# Patient Record
Sex: Male | Born: 1970 | Race: White | Hispanic: No | Smoking: Never smoker
Health system: Southern US, Community
[De-identification: ages and names within clinical notes are randomized; demographics above are authoritative.]

## PROBLEM LIST (undated history)

## (undated) DIAGNOSIS — I509 Heart failure, unspecified: Secondary | ICD-10-CM

## (undated) DIAGNOSIS — E1140 Type 2 diabetes mellitus with diabetic neuropathy, unspecified: Secondary | ICD-10-CM

## (undated) DIAGNOSIS — I10 Essential (primary) hypertension: Secondary | ICD-10-CM

## (undated) DIAGNOSIS — K5730 Diverticulosis of large intestine without perforation or abscess without bleeding: Secondary | ICD-10-CM

## (undated) DIAGNOSIS — K5792 Diverticulitis of intestine, part unspecified, without perforation or abscess without bleeding: Secondary | ICD-10-CM

## (undated) DIAGNOSIS — M5430 Sciatica, unspecified side: Secondary | ICD-10-CM

## (undated) DIAGNOSIS — N189 Chronic kidney disease, unspecified: Secondary | ICD-10-CM

## (undated) DIAGNOSIS — I219 Acute myocardial infarction, unspecified: Secondary | ICD-10-CM

## (undated) DIAGNOSIS — F1910 Other psychoactive substance abuse, uncomplicated: Secondary | ICD-10-CM

## (undated) DIAGNOSIS — M1990 Unspecified osteoarthritis, unspecified site: Secondary | ICD-10-CM

## (undated) DIAGNOSIS — T7840XA Allergy, unspecified, initial encounter: Secondary | ICD-10-CM

## (undated) DIAGNOSIS — E119 Type 2 diabetes mellitus without complications: Secondary | ICD-10-CM

## (undated) HISTORY — DX: Essential (primary) hypertension: I10

## (undated) HISTORY — PX: HERNIA REPAIR: SHX51

## (undated) HISTORY — DX: Allergy, unspecified, initial encounter: T78.40XA

## (undated) HISTORY — DX: Heart failure, unspecified: I50.9

## (undated) HISTORY — DX: Acute myocardial infarction, unspecified: I21.9

## (undated) HISTORY — DX: Unspecified osteoarthritis, unspecified site: M19.90

## (undated) HISTORY — PX: CARDIAC SURGERY: SHX584

## (undated) HISTORY — DX: Other psychoactive substance abuse, uncomplicated: F19.10

## (undated) HISTORY — DX: Chronic kidney disease, unspecified: N18.9

## (undated) HISTORY — PX: COLON SURGERY: SHX602

---

## 2013-05-12 DIAGNOSIS — E1169 Type 2 diabetes mellitus with other specified complication: Secondary | ICD-10-CM | POA: Insufficient documentation

## 2013-05-12 DIAGNOSIS — K5790 Diverticulosis of intestine, part unspecified, without perforation or abscess without bleeding: Secondary | ICD-10-CM | POA: Insufficient documentation

## 2013-05-12 DIAGNOSIS — E7800 Pure hypercholesterolemia, unspecified: Secondary | ICD-10-CM | POA: Insufficient documentation

## 2013-05-12 DIAGNOSIS — I10 Essential (primary) hypertension: Secondary | ICD-10-CM | POA: Insufficient documentation

## 2013-05-12 DIAGNOSIS — E782 Mixed hyperlipidemia: Secondary | ICD-10-CM | POA: Insufficient documentation

## 2013-05-12 DIAGNOSIS — E119 Type 2 diabetes mellitus without complications: Secondary | ICD-10-CM | POA: Insufficient documentation

## 2013-08-21 ENCOUNTER — Ambulatory Visit: Payer: Self-pay

## 2013-08-21 ENCOUNTER — Emergency Department: Payer: Self-pay | Admitting: Emergency Medicine

## 2013-08-21 LAB — URINALYSIS, COMPLETE
Bacteria: NONE SEEN
Glucose,UR: >=500 mg/dL (ref 0–75)
Leukocyte Esterase: NEGATIVE
Nitrite: NEGATIVE
Protein: 100
RBC,UR: 1 /HPF (ref 0–5)
WBC UR: <1 /HPF (ref 0–5)

## 2013-08-21 LAB — BASIC METABOLIC PANEL
Anion Gap: 16 (ref 7–16)
Calcium, Total: 9 mg/dL (ref 8.5–10.1)
Chloride: 102 mmol/L (ref 98–107)
Co2: 13 mmol/L — ABNORMAL LOW (ref 21–32)
Creatinine: 0.35 mg/dL — ABNORMAL LOW (ref 0.60–1.30)
EGFR (African American): >60
EGFR (Non-African Amer.): >60
Potassium: 5.5 mmol/L — ABNORMAL HIGH (ref 3.5–5.1)
Sodium: 131 mmol/L — ABNORMAL LOW (ref 136–145)

## 2013-08-26 LAB — CBC
HCT: 43.5 % (ref 40.0–52.0)
HGB: 13.8 g/dL (ref 13.0–18.0)
MCH: 28.2 pg (ref 26.0–34.0)
Platelet: 237 10*3/uL (ref 150–440)
RBC: 4.88 10*6/uL (ref 4.40–5.90)
WBC: 5.2 10*3/uL (ref 3.8–10.6)

## 2013-08-30 ENCOUNTER — Observation Stay: Payer: Self-pay | Admitting: Internal Medicine

## 2013-08-30 LAB — BASIC METABOLIC PANEL
Anion Gap: 16 (ref 7–16)
BUN: 13 mg/dL (ref 7–18)
Calcium, Total: 9.1 mg/dL (ref 8.5–10.1)
Chloride: 89 mmol/L — ABNORMAL LOW (ref 98–107)
Creatinine: 0.53 mg/dL — ABNORMAL LOW (ref 0.60–1.30)
EGFR (African American): >60
EGFR (Non-African Amer.): >60
Potassium: 4.5 mmol/L (ref 3.5–5.1)
Sodium: 122 mmol/L — ABNORMAL LOW (ref 136–145)

## 2013-08-30 LAB — HEPATIC FUNCTION PANEL A (ARMC)
Alkaline Phosphatase: 94 U/L
Bilirubin, Direct: <0.1 mg/dL (ref 0.00–0.20)
Bilirubin,Total: 0.8 mg/dL (ref 0.2–1.0)
SGOT(AST): 29 U/L (ref 15–37)
SGPT (ALT): 30 U/L (ref 12–78)
Total Protein: 7.7 g/dL (ref 6.4–8.2)

## 2013-08-30 LAB — LIPASE, BLOOD: Lipase: 275 U/L (ref 73–393)

## 2013-08-30 LAB — CBC
HCT: 43.3 % (ref 40.0–52.0)
HGB: 15.9 g/dL (ref 13.0–18.0)
MCH: 33.2 pg (ref 26.0–34.0)
MCHC: 36.6 g/dL — ABNORMAL HIGH (ref 32.0–36.0)
MCV: 91 fL (ref 80–100)
RDW: 13.8 % (ref 11.5–14.5)
WBC: 5.9 10*3/uL (ref 3.8–10.6)

## 2013-08-30 LAB — CK TOTAL AND CKMB (NOT AT ARMC): CK, Total: 140 U/L (ref 35–232)

## 2013-08-30 LAB — TROPONIN I: Troponin-I: <0.02 ng/mL

## 2014-11-15 LAB — BASIC METABOLIC PANEL
BUN: 14 mg/dL (ref 4–21)
Creatinine: 0.9 mg/dL (ref 0.6–1.3)
Glucose: 386 mg/dL
Sodium: 136 mmol/L — AB (ref 137–147)

## 2014-11-15 LAB — HEPATIC FUNCTION PANEL: Bilirubin, Total: 0.4 mg/dL

## 2014-12-11 ENCOUNTER — Ambulatory Visit
Admit: 2014-12-11 | Disposition: A | Discharge status: Home / Self Care | Payer: Self-pay | Attending: Internal Medicine | Admitting: Internal Medicine

## 2015-01-04 ENCOUNTER — Ambulatory Visit
Admit: 2015-01-04 | Disposition: A | Discharge status: Home / Self Care | Payer: Self-pay | Attending: Internal Medicine | Admitting: Internal Medicine

## 2015-01-25 NOTE — Discharge Summary (Signed)
PATIENT NAME:  Franklin Douglas, Andrews C MR#:  [id number] DATE OF BIRTH:  Aug 20, 1971  DATE OF ADMISSION:  08/30/2013  DATE OF DISCHARGE:  08/30/2013  PRIMARY CARE PHYSICIAN:  Dr. [place]ariah in [place]   CHIEF COMPLAINT: Chest pain and epigastric pain.   DISCHARGE DIAGNOSES: 1.  Chest pain, appears atypical, improved.  2.  Epigastric pain suspected due to acid reflux/GERD, improved with GI cocktail and Protonix.  3.  Type 2 diabetes.  4.  Hyperlipidemia.  5.  Hypertension.   CONDITION ON DISCHARGE: Fair.   CONSULTATIONS:  Cardiology, Dr. Darrold JunkerParaschos recommends outpatient stress test.   LABS AT DISCHARGE:  Cardiac enzymes x 2 negative. 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 No evidence of bowel obstruction. Normal appendix. Hepatic steatosis. CT angiography of the chest shows no evidence of PE, intramural hematoma, aortic dissection, aortic transection, or aortic aneurysm. No acute bony deformity. There is wall thickening of the distal half of the esophagus. Trace proximal calcifications. LFTs within normal limits. Chest x-ray within normal limits. CBC within normal limits. Glucose 402, BUN 13, creatinine 0.53, sodium 122, potassium 12.5, chloride is 89, bicarb is 17. Lipase is 275.   BRIEF SUMMARY OF HOSPITAL COURSE:  Franklin Douglas is a 44 year old Caucasian gentleman with history of type 2 diabetes, hypertension and hyperlipidemia, comes to the Emergency Room with complaints of left-sided chest discomfort along with epigastric pain. He was admitted with:   1.  Chest pain. The patient was ruled out for acute MI with negative EKG and 2 sets of negative cardiac enzymes. He was seen by Dr. Darrold JunkerParaschos, who recommends patient take metoprolol for his tachycardia, continue his losartan statins. The patient's symptoms improved. He did not have any further chest pain, and his EKG did not show acute ST-T changes. Per Dr. Darrold JunkerParaschos,   patient could get outpatient stress test.   2.  Uncontrolled type 2 diabetes. The patient has not taken his insulin for the last few days. He is recommended to go back on his insulin regimen. As before, check sugars and watch his diet.   3.  Hyponatremia. Appears to be due to mild dehydration and pseudohyponatremia in the setting of elevated sugars. The patient received a couple of liters of IV fluids, and he appears euvolemic.  4.  Hypertension. The patient was continued on his losartan, beta blockers. Metoprolol was added by Dr. Darrold JunkerParaschos.   5.  Acid reflux, GERD, and epigastric discomfort. The patient received GI cocktail in the Emergency Room. He felt improved with that. He is prescribed Prilosec 40 mg p.o. daily.   6.  Alcohol abuse. The patient was kept on CIWA protocol. He appears to be stable at this time. He is advised on alcohol, abstinence from alcohol.    Hospital stay otherwise remained stable. The patient will follow up with his primary care physician in 1 to 2 weeks, and follow up with Dr. Darrold JunkerParaschos as outpatient for a stress test. The patient requested he wanted to go home, since he was feeling better. He had a good meal while he was here in the hospital, and felt a lot better.   TIME SPENT: 40 minutes.    ____________________________ Wylie HailSona A. Allena KatzPatel, MD sap:mr D: 08/30/2013 17:05:00 ET T: 08/30/2013 20:24:20 ET JOB#: [id number]  cc: Blenda NicelyAnita Mary Skariah, DO Asheton Scheffler A. Allena KatzPatel, MD, <Dictator>   Willow OraSONA A Jrue Yambao MD ELECTRONICALLY SIGNED 09/01/2013 10:42

## 2015-01-25 NOTE — H&P (Signed)
PATIENT NAME:  Franklin Douglas, Franklin Douglas MR#:  [id number] DATE OF BIRTH:  Sep 22, 1971  DATE OF ADMISSION:  08/30/2013  PRIMARY CARE PHYSICIAN:  Dr. Lewis MoccasinSkariah, [facility].  CHIEF COMPLAINT: Chest pain and epigastric pain on and off for 1 week.   Franklin Douglas is a 44 year old Caucasian gentleman with history of hypertension, diabetes and history of some colon resection in the remote past, along with hypercholesterolemia, comes to the Emergency Room after he started having chest pain, left-sided, with some numbness in his left arm, along with epigastric discomfort with symptoms of vomiting x 2 yesterday. The patient states he could not get comfortable, not able to sleep with his chest discomfort. Came to the Emergency Room and was found to be very tachycardic with heart rate in the 120s. He received some IV fluids. Heart rate came down to 112. Blood pressure is stable; however, he still does not feel comfortable. He received GI cocktail in the Emergency Room which helped him. He also received a dose of Protonix in the Emergency Room. The patient's sodium is 122. He has uncontrolled diabetes since he has not taken his insulin in the last few days. The patient reports he binge drank this weekend, a lot of whiskey and liquor because of this ongoing chest pain, thought it would help him; however, his symptoms got worse, came to the Emergency Room, for which he is going to be admitted for further evaluation and management.   PAST MEDICAL HISTORY: 1.  Type 2 diabetes, on insulin.  2.  Hyperlipidemia.  3.  Hypertension.  4.  History of colon resection in the past.    ALLERGIES: SULFA DRUGS.   MEDICATIONS: 1.  Fenofibrate 160 mg p.o. daily at bedtime.  2.  Aspirin 325 p.o. daily.  3.  Humulin N 70/30, 60 units daily.  4.  Losartan 25 mg at bedtime.   FAMILY HISTORY: Positive for mother with diabetes and grandmother, who passed away, had history of CAD.   SOCIAL HISTORY: He is unemployed since  August. Denies any history of smoking. Drinks alcohol 1 to 2 whiskeys couple of days a week. However, last weekend, he drank a lot of  whiskey and vodka. The patient denies any DTs or any alcohol related problems in the past.     REVIEW OF SYSTEMS:   CONSTITUTIONAL: Positive for fatigue, weakness.  EYES: No blurred or double vision, redness or glaucoma.  ENT: No tinnitus, ear pain, hearing loss or epistaxis.  RESPIRATORY: No cough, wheeze, hemoptysis or COPD.  CARDIOVASCULAR: Positive for chest pain, tachycardia and hypertension.  GASTROINTESTINAL: No nausea.  Positive for vomiting, epigastric discomfort. Negative for GERD, rectal bleeding. No constipation.  GENITOURINARY: No dysuria, hematuria or frequency.  ENDOCRINE: No polyuria, nocturia or thyroid problems.  HEMATOLOGY: No anemia or easy bruising.  SKIN: No acne or rash.  MUSCULOSKELETAL: Negative for arthritis, cramps, swelling or gout.  NEUROLOGIC: No CVA, TIA, seizures or dysarthria.  PSYCHIATRIC: No anxiety or depression or bipolar disorder. All other systems reviewed are negative.   PHYSICAL EXAMINATION: GENERAL: The patient is awake, alert, oriented x 3. Mild to moderate distress due to chest and abdominal discomfort.  VITAL SIGNS: Temperature 97.9. Pulse is 112, respirations 18. Blood pressure is 122/74. Sats are 97% on room air.  HEENT: Atraumatic, normocephalic. Pupils: PERRLA. EOM intact. Oral mucosa is dry.  NECK: Supple. No JVD. No carotid bruit.  LUNGS: Clear to auscultation bilaterally. No rales, rhonchi, respiratory distress or labored breathing.  CARDIOVASCULAR: Tachycardia present. No murmur  heard. No pain is elicited on palpation of the chest. No murmur heard.  ABDOMEN: Soft. No tenderness in the epigastric or right upper quadrant. There is some tenderness present in the left upper quadrant. No mass or any bruit noted. Bowel sounds are positive.  NEUROLOGIC: Grossly intact cranial nerves II through XII. No motor or  sensory deficits. Reflexes 1+ at both upper and lower extremities.  PSYCHIATRIC: The patient is awake, alert, oriented x 3. Mood and affect are normal.  SKIN: Warm and dry.   PH is 7.26. PCO2 is 28.   Lipid profile within normal limits.   Troponin is 0.02.   CBC within normal limits.   Basic metabolic panel shows a glucose of 402, BUN 13, creatinine 0.53. Sodium is 122. Potassium is 4.5. Chloride is 89. Bicarb is 17. Anion gap is 16. Lipase is 275.   EKG shows sinus tachycardia with LAD and nonspecific ST-T changes.   ASSESSMENT AND PLAN: A 44 year old Carmichael Burdette with history of hypertension, type 2 diabetes, hyperlipidemia, comes in with on-and-off chest pain, along with alcohol binge drinking over the weekend and epigastric discomfort. He is going to be admitted with:  1.  Chest pain and tachycardia. The patient has chest pressure, which has been on and off for the last 1 week. We will admit the patient to telemetry floor, cycle cardiac enzymes x 3. EKG does not show any ST elevation or depression. I will give the patient nitroglycerin p.r.n.  Have cardiology see the patient. The patient may benefit from Myoview stress test. I will hold off on aspirin given his epigastric discomfort. This could be due to acid reflux as well.  2.  Epigastric discomfort with history of alcohol drinking, significant amount over the weekend. The patient could have developed acute gastritis. We will start the patient on Protonix IV b.i.d. Consider GI consultation if symptoms are not improved. The patient's CT of the abdomen is pending.  3.  Type 2 diabetes. Resume insulin 60 units at lunchtime and sliding scale insulin.  4.  Hypertension, on losartan.  5.  Hyponatremia with dehydration secondary to nausea and vomiting at home. We will give IV fluids. Some of the low sodium level could be because of uncontrolled diabetes contributing to pseudohyponatremia. Given his symptoms, we will give IV fluids, follow up  metabolic panel.  6.  Deep venous thrombosis prophylaxis. The patient will be on subQ heparin.  7.  Gastrointestinal prophylaxis. The patient is on IV Protonix b.i.d.  8.  Hyperlipidemia, on fenofibrate.   The above was discussed with the patient, who is agreeable to it. Further workup will depend on the patient's clinical course.   TIME SPENT: 55 minutes.     ____________________________ Wylie Hail Allena Katz, MD sap:dmm D: 08/30/2013 11:39:15 ET T: 08/30/2013 12:18:51 ET JOB#: [id number]  cc: Tamaj Jurgens A. Allena Katz, MD, <Dictator> Blenda Nicely, DO Willow Ora MD ELECTRONICALLY SIGNED 09/01/2013 10:42

## 2015-01-25 NOTE — Consult Note (Signed)
PATIENT NAME:  Franklin Douglas, Franklin Douglas#:  [id number] DATE OF BIRTH:  [birth year]  DATE OF CONSULTATION:  08/30/2013  CONSULTING PHYSICIAN:  Marcina MillardAlexander Skylene Deremer, MD  PRIMARY CARE PHYSICIAN: Dr. Lewis MoccasinSkariah, [facility].  CHIEF COMPLAINT: Chest and epigastric discomfort.   REASON FOR CONSULTATION: Consultation requested for evaluation of chest pain.   HISTORY OF PRESENT ILLNESS: The patient is a 44 year old gentleman with history of hypertension, hyperlipidemia, and diabetes. The patient has also had a history of colon resection. The patient apparently was [facility] Room a week ago with chest discomfort, epigastric discomfort, nausea. The patient has had recurrent symptoms earlier today and presented to [facility]. The patient was noted to be tachycardic with sinus tachycardia in the 110 to 120 range. The patient was treated with GI cocktail, which provided some relief, and the patient was treated with Protonix. The patient was noted to be hyponatremic with a sodium of 122. The patient apparently has not taken his insulin in several days. He was admitted to telemetry where initial troponin is negative. The patient's symptoms have improved.   PAST MEDICAL HISTORY: 1.  Hypertension.  2.  Hyperlipidemia.  3.  Diabetes.  4.  History of colon resection.  MEDICATIONS: Aspirin 325 mg daily, losartan 25 mg daily, Humulin N 70/30 with 6 units daily, fenofibrate 160 mg at bedtime.   SOCIAL HISTORY: The patient is unemployed. He lives alone. He drinks occasional alcohol. He apparently had binge drinking last weekend. He denies tobacco abuse.   FAMILY HISTORY: No immediate family history for coronary artery disease or myocardial infarction.   REVIEW OF SYSTEMS:    CONSTITUTIONAL: No fever or chills.  EYES: No blurry vision.  EARS: No hearing loss.  RESPIRATORY: No shortness of breath.  CARDIOVASCULAR: Chest discomfort as described above.  GASTROINTESTINAL: Midepigastric  discomfort with nausea and vomiting.  GENITOURINARY: No dysuria or hematuria.  ENDOCRINE: The patient has type 2 diabetes.  HEMATOLOGICAL: No easy bruising or bleeding.  INTEGUMENTARY: No rash.  MUSCULOSKELETAL: No arthralgias or myalgias.  NEUROLOGICAL: No focal muscle weakness or numbness.  PSYCHOLOGICAL: No depression or anxiety.   PHYSICAL EXAMINATION: VITAL SIGNS: Blood pressure 136/87, pulse 108, respirations 18, temperature 97.5, pulse oximetry 97%.  HEENT: Pupils equal, reactive to light and accommodation.  NECK: Supple without thyromegaly.  LUNGS: Clear.  HEART: Normal JVP. Normal PMI. Regular rate and rhythm. Normal S1, S2. No appreciable gallop, murmur, or rub.  ABDOMEN: Soft and nontender without hepatosplenomegaly.  EXTREMITIES: No cyanosis, clubbing, or edema. Pulses were intact bilaterally.  MUSCULOSKELETAL: Normal muscle tone.  NEUROLOGIC: The patient is alert and oriented x 3. Motor and sensory both grossly intact.   IMPRESSION: A 44 year old gentleman with multiple cardiovascular risk factors with chest pain with atypical features with negative troponin. The patient's symptoms appear to improved.   RECOMMENDATIONS: 1.  Agree with overall current therapy.  2.  Would defer full-dose anticoagulation.  3.  If second troponin is negative, may consider outpatient stress test. 4.  Stressed with the patient about the importance of being compliant with his medications, especially insulin.  ____________________________ Marcina MillardAlexander Estle Sabella, MD ap:jcm D: 08/30/2013 14:17:21 ET T: 08/30/2013 15:02:42 ET JOB#: [id number]  cc: Marcina MillardAlexander Danyell Awbrey, MD, <Dictator> Marcina MillardALEXANDER Alaycia Eardley MD ELECTRONICALLY SIGNED 09/22/2013 8:50

## 2015-02-20 ENCOUNTER — Other Ambulatory Visit: Payer: Self-pay

## 2015-02-20 LAB — CBC AND DIFFERENTIAL
NEUTROS ABS: 2 /uL
WBC: 3.8 10*3/mL

## 2015-02-20 LAB — TSH: TSH: 2.68 u[IU]/mL (ref 0.41–5.90)

## 2015-02-20 LAB — LIPID PANEL
Cholesterol: 256 mg/dL — AB (ref 0–200)
HDL: 41 mg/dL (ref 35–70)
Triglycerides: 463 mg/dL — AB (ref 40–160)

## 2015-02-20 LAB — MICROALBUMIN, URINE: Microalb, Ur: 510.4

## 2015-02-20 LAB — HEMOGLOBIN A1C: Hemoglobin A1C: 6.8

## 2015-02-27 ENCOUNTER — Encounter: Payer: Self-pay | Admitting: Emergency Medicine

## 2015-02-27 ENCOUNTER — Ambulatory Visit
Admission: EM | Admit: 2015-02-27 | Discharge: 2015-02-27 | Disposition: A | Discharge status: Home / Self Care | Payer: Self-pay | Attending: Family Medicine | Admitting: Family Medicine

## 2015-02-27 ENCOUNTER — Ambulatory Visit: Payer: Self-pay

## 2015-02-27 ENCOUNTER — Ambulatory Visit: Payer: Self-pay | Admitting: Internal Medicine

## 2015-02-27 DIAGNOSIS — R05 Cough: Secondary | ICD-10-CM | POA: Insufficient documentation

## 2015-02-27 DIAGNOSIS — Z882 Allergy status to sulfonamides status: Secondary | ICD-10-CM | POA: Insufficient documentation

## 2015-02-27 DIAGNOSIS — R059 Cough, unspecified: Secondary | ICD-10-CM

## 2015-02-27 DIAGNOSIS — B349 Viral infection, unspecified: Secondary | ICD-10-CM | POA: Insufficient documentation

## 2015-02-27 DIAGNOSIS — E1140 Type 2 diabetes mellitus with diabetic neuropathy, unspecified: Secondary | ICD-10-CM | POA: Insufficient documentation

## 2015-02-27 DIAGNOSIS — E119 Type 2 diabetes mellitus without complications: Secondary | ICD-10-CM | POA: Insufficient documentation

## 2015-02-27 DIAGNOSIS — Z794 Long term (current) use of insulin: Secondary | ICD-10-CM | POA: Insufficient documentation

## 2015-02-27 HISTORY — DX: Sciatica, unspecified side: M54.30

## 2015-02-27 HISTORY — DX: Type 2 diabetes mellitus without complications: E11.9

## 2015-02-27 HISTORY — DX: Diverticulitis of intestine, part unspecified, without perforation or abscess without bleeding: K57.92

## 2015-02-27 HISTORY — DX: Type 2 diabetes mellitus with diabetic neuropathy, unspecified: E11.40

## 2015-02-27 MED ORDER — BENZONATATE 200 MG PO CAPS: 200.0000 mg | ORAL_CAPSULE | Freq: Three times a day (TID) | ORAL | Status: DC | PRN

## 2015-02-27 MED ORDER — ALBUTEROL SULFATE HFA 108 (90 BASE) MCG/ACT IN AERS: 1.0000 | INHALATION_SPRAY | Freq: Four times a day (QID) | RESPIRATORY_TRACT | Status: DC | PRN

## 2015-02-27 MED ORDER — IPRATROPIUM-ALBUTEROL 0.5-2.5 (3) MG/3ML IN SOLN
3.0000 mL | Freq: Once | RESPIRATORY_TRACT | Status: AC
  Administered 2015-02-27: 3 mL via RESPIRATORY_TRACT

## 2015-02-27 NOTE — ED Provider Notes (Signed)
CSN: [id number]     Arrival date & time 02/27/15  1435 History   First MD Initiated Contact with Patient 02/27/15 1505     Chief Complaint  Patient presents with  . Cough   (Consider location/radiation/quality/duration/timing/severity/associated sxs/prior Treatment) Patient is a 44 y.o. male presenting with cough. The history is provided by the patient.  Cough Cough characteristics:  Productive Sputum characteristics:  Bloody Severity:  Mild Onset quality:  Sudden Duration:  2 hours Chronicity:  New Smoker: no   Relieved by:  Nothing Worsened by:  Nothing tried Associated symptoms: chills, diaphoresis and shortness of breath   Associated symptoms: no chest pain, no ear fullness, no ear pain, no fever, no rhinorrhea, no sinus congestion, no sore throat, no weight loss and no wheezing     Past Medical History  Diagnosis Date  . Diabetes mellitus without complication   . Diabetic neuropathy   . Sciatica   . Diverticulitis    Past Surgical History  Procedure Laterality Date  . Colon surgery    . Hernia repair     No family history on file. History  Substance Use Topics  . Smoking status: Never Smoker   . Smokeless tobacco: Never Used  . Alcohol Use: 12.0 oz/week    20 Shots of liquor per week    Review of Systems  Constitutional: Positive for chills and diaphoresis. Negative for fever and weight loss.  HENT: Negative for ear pain, rhinorrhea and sore throat.   Respiratory: Positive for cough and shortness of breath. Negative for wheezing.   Cardiovascular: Negative for chest pain.    Allergies  Sulfa antibiotics  Home Medications   Prior to Admission medications   Medication Sig Start Date End Date Taking? Authorizing Provider  insulin aspart (NOVOLOG) 100 UNIT/ML injection Inject 20 Units into the skin 3 (three) times daily before meals.   Yes Historical Provider, MD  insulin detemir (LEVEMIR) 100 UNIT/ML injection Inject 30 Units into the skin at bedtime.    Yes Historical Provider, MD  albuterol (PROVENTIL HFA;VENTOLIN HFA) 108 (90 BASE) MCG/ACT inhaler Inhale 1-2 puffs into the lungs every 6 (six) hours as needed for wheezing or shortness of breath. 02/27/15   Payton Mccallumrlando Jinna Weinman, MD  benzonatate (TESSALON) 200 MG capsule Take 1 capsule (200 mg total) by mouth 3 (three) times daily as needed for cough. 02/27/15   Payton Mccallumrlando Moxie Kalil, MD   BP 132/76 mmHg  Pulse 112  Temp(Src) 98.5 F (36.9 C) (Oral)  Resp 18  Ht 5\' 11"  (1.803 m)  Wt 211 lb (95.709 kg)  BMI 29.44 kg/m2  SpO2 99% Physical Exam  Constitutional: He appears well-developed and well-nourished. No distress.  HENT:  Head: Normocephalic and atraumatic.  Right Ear: Tympanic membrane, external ear and ear canal normal.  Left Ear: Tympanic membrane, external ear and ear canal normal.  Nose: Nose normal.  Mouth/Throat: Uvula is midline, oropharynx is clear and moist and mucous membranes are normal. No oropharyngeal exudate or tonsillar abscesses.  Eyes: Conjunctivae and EOM are normal. Pupils are equal, round, and reactive to light. Right eye exhibits no discharge. Left eye exhibits no discharge. No scleral icterus.  Neck: Normal range of motion. Neck supple. No tracheal deviation present. No thyromegaly present.  Cardiovascular: Normal rate, regular rhythm and normal heart sounds.   Pulmonary/Chest: Effort normal and breath sounds normal. No stridor. No respiratory distress. He has no wheezes. He has no rales. He exhibits no tenderness.  Rhonchi on left and mild diffuse expiratory wheezes  Lymphadenopathy:    He has no cervical adenopathy.  Neurological: He is alert.  Skin: Skin is warm and dry. No rash noted. He is not diaphoretic.  Nursing note and vitals reviewed.   ED Course  Procedures (including critical care time) Labs Review Labs Reviewed - No data to display  Imaging Review Dg Chest 2 View  02/27/2015   CLINICAL DATA:  Cough, sweating, diarrhea  EXAM: CHEST  2 VIEW  COMPARISON:   08/30/2013  FINDINGS: Cardiomediastinal silhouette is stable. No acute infiltrate or pleural effusion. No pulmonary edema. Bony thorax is unremarkable.  IMPRESSION: No active cardiopulmonary disease.   Electronically Signed   By: Natasha Mead M.D.   On: 02/27/2015 15:44     MDM   1. Cough   2. Viral syndrome    Discharge Medication List as of 02/27/2015  3:53 PM    START taking these medications   Details  albuterol (PROVENTIL HFA;VENTOLIN HFA) 108 (90 BASE) MCG/ACT inhaler Inhale 1-2 puffs into the lungs every 6 (six) hours as needed for wheezing or shortness of breath., Starting 02/27/2015, Until Discontinued, Normal    benzonatate (TESSALON) 200 MG capsule Take 1 capsule (200 mg total) by mouth 3 (three) times daily as needed for cough., Starting 02/27/2015, Until Discontinued, Normal      Plan: 1. x-ray results (negative) and diagnosis reviewed with patient 2. rx as per orders; risks, benefits, potential side effects reviewed with patient 3. Patient given Duoneb treatment in clinic with improvement of symptoms 4. Recommend supportive treatment with otc analgesics prn 5. F/u prn if symptoms worsen or don't improve    [name], MD 02/27/15 2044

## 2015-02-27 NOTE — ED Notes (Signed)
Coughing spells that started about 1 hour ago. States he felt fine this morning. He reports the coughing spell started right after having sex with his girlfriend. Also reports having diarrhea this morning and trouble breathing during coughing episodes. No fever or chills. Reports sweats earlier today.

## 2015-02-27 NOTE — ED Notes (Signed)
Nebulizer treatment completed, O2 sats upto 99%

## 2015-11-19 ENCOUNTER — Ambulatory Visit
Admission: EM | Admit: 2015-11-19 | Discharge: 2015-11-19 | Disposition: A | Discharge status: Home / Self Care | Payer: BLUE CROSS/BLUE SHIELD | Attending: Family Medicine | Admitting: Family Medicine

## 2015-11-19 ENCOUNTER — Ambulatory Visit (INDEPENDENT_AMBULATORY_CARE_PROVIDER_SITE_OTHER): Payer: BLUE CROSS/BLUE SHIELD

## 2015-11-19 DIAGNOSIS — S20212A Contusion of left front wall of thorax, initial encounter: Secondary | ICD-10-CM | POA: Diagnosis not present

## 2015-11-19 HISTORY — DX: Diverticulosis of large intestine without perforation or abscess without bleeding: K57.30

## 2015-11-19 MED ORDER — HYDROCODONE-ACETAMINOPHEN 5-325 MG PO TABS: 1.0000 | ORAL_TABLET | Freq: Four times a day (QID) | ORAL | Status: DC | PRN

## 2015-11-19 NOTE — ED Provider Notes (Signed)
CSN: [id number]     Arrival date & time 11/19/15  1256 History   First MD Initiated Contact with Patient 11/19/15 1404     Chief Complaint  Patient presents with  . Rib Injury   (Consider location/radiation/quality/duration/timing/severity/associated sxs/prior Treatment) HPI  45 year old gentleman who presents with left anterior lateral rib pain after he fell against an ottoman when he tripped after being dizzy from drinking too much. Since then he's had a frequent cough and finds it difficult to take a deep breath. His cough has actually been present for many months has been under the care of his primary care physician Dr. Stephenie Acres at [facility].  Past Medical History  Diagnosis Date  . Diabetes mellitus without complication (HCC)   . Diabetic neuropathy (HCC)   . Sciatica   . Diverticulitis   . Diverticula, colon    Past Surgical History  Procedure Laterality Date  . Colon surgery    . Hernia repair     Family History  Problem Relation Age of Onset  . Diabetes Mother    Social History  Substance Use Topics  . Smoking status: Never Smoker   . Smokeless tobacco: Never Used  . Alcohol Use: 12.0 oz/week    20 Shots of liquor per week    Review of Systems  Constitutional: Positive for activity change. Negative for fever, chills and fatigue.  Respiratory: Positive for cough and shortness of breath. Negative for wheezing and stridor.   Cardiovascular: Positive for chest pain.  All other systems reviewed and are negative.   Allergies  Sulfa antibiotics  Home Medications   Prior to Admission medications   Medication Sig Start Date End Date Taking? Authorizing Provider  insulin aspart (NOVOLOG) 100 UNIT/ML injection Inject 20 Units into the skin 3 (three) times daily before meals.   Yes Historical Provider, MD  insulin detemir (LEVEMIR) 100 UNIT/ML injection Inject 30 Units into the skin at bedtime.   Yes Historical Provider, MD  albuterol (PROVENTIL HFA;VENTOLIN  HFA) 108 (90 BASE) MCG/ACT inhaler Inhale 1-2 puffs into the lungs every 6 (six) hours as needed for wheezing or shortness of breath. 02/27/15   [name], MD  benzonatate (TESSALON) 200 MG capsule Take 1 capsule (200 mg total) by mouth 3 (three) times daily as needed for cough. 02/27/15   [name], MD  HYDROcodone-acetaminophen (NORCO/VICODIN) 5-325 MG tablet Take 1-2 tablets by mouth every 6 (six) hours as needed for severe pain. 11/19/15   Lutricia Feil, PA-C   Meds Ordered and Administered this Visit  Medications - No data to display  BP 137/97 mmHg  Pulse 112  Temp(Src) 97.7 F (36.5 C) (Tympanic)  Resp 18  Ht 5' 11.5" (1.816 m)  Wt 208 lb (94.348 kg)  BMI 28.61 kg/m2  SpO2 96% No data found.   Physical Exam  Constitutional: He is oriented to person, place, and time. He appears well-developed and well-nourished. No distress.  HENT:  Head: Normocephalic and atraumatic.  Eyes: Conjunctivae are normal. Pupils are equal, round, and reactive to light.  Neck: Normal range of motion. Neck supple.  Pulmonary/Chest: Effort normal and breath sounds normal. No respiratory distress. He has no wheezes. He has no rales.  Examination of the left anterior ribs shows a tenderness but no ecchymosis. Tenderness is sharply localized over the 10th and 11th rib mostly. There is no crepitus present. Examination of the lungs shows good effort with good air movement. There are bilateral basilar crackles present.  Musculoskeletal: Normal range  of motion. He exhibits no edema or tenderness.  Neurological: He is alert and oriented to person, place, and time.  Skin: Skin is warm and dry. He is not diaphoretic.  Psychiatric: He has a normal mood and affect. His behavior is normal. Judgment and thought content normal.  Nursing note and vitals reviewed.   ED Course  Procedures (including critical care time)  Labs Review Labs Reviewed - No data to display  Imaging Review Dg Ribs Unilateral  W/chest Left  11/19/2015  CLINICAL DATA:  Status post fall 3 days ago 0 with pain in the left ribs. EXAM: LEFT RIBS AND CHEST - 3+ VIEW COMPARISON:  Feb 27, 2015 FINDINGS: No fracture or other bone lesions are seen involving the ribs. There is no evidence of pneumothorax or pleural effusion. There is a small calcified granuloma in the left upper lobe. There is no focal pneumonia, pulmonary edema, or pleural effusion. Heart size and mediastinal contours are within normal limits. There are degenerative joint changes of the spine. IMPRESSION: No acute fracture dislocation of the left ribs. Electronically Signed   By: Sherian Rein M.D.   On: 11/19/2015 14:04     Visual Acuity Review  Right Eye Distance:   Left Eye Distance:   Bilateral Distance:    Right Eye Near:   Left Eye Near:    Bilateral Near:         MDM   1. Contusion of ribs, left, initial encounter    Discharge Medication List as of 11/19/2015  2:30 PM    START taking these medications   Details  HYDROcodone-acetaminophen (NORCO/VICODIN) 5-325 MG tablet Take 1-2 tablets by mouth every 6 (six) hours as needed for severe pain., Starting 11/19/2015, Until Discontinued, Print      Plan: 1. Test/x-ray results and diagnosis reviewed with patient 2. rx as per orders; risks, benefits, potential side effects reviewed with patient 3. Recommend supportive treatment with alternating heat and ice. Impressed  upon him the importance of coughing and deep breathing frequently. Given him several pain pills for a use at nighttime to help sleep cautioned him regarding use of alcohol in conjunction with the medication. For the granuloma seen on x-ray today have recommended that he follow-up with his primary care to compare with a previous film that he had last month.  4. F/u prn if symptoms worsen or don't improve     Lutricia Feil, PA-C 11/19/15 1530

## 2015-11-19 NOTE — Discharge Instructions (Signed)
Chest Contusion A chest contusion is a deep bruise on your chest area. Contusions are the result of an injury that caused bleeding under the skin. A chest contusion may involve bruising of the skin, muscles, or ribs. The contusion may turn blue, purple, or yellow. Minor injuries will give you a painless contusion, but more severe contusions may stay painful and swollen for a few weeks. CAUSES  A contusion is usually caused by a blow, trauma, or direct force to an area of the body. SYMPTOMS   Swelling and redness of the injured area.  Discoloration of the injured area.  Tenderness and soreness of the injured area.  Pain. DIAGNOSIS  The diagnosis can be made by taking a history and performing a physical exam. An X-ray, CT scan, or MRI may be needed to determine if there were any associated injuries, such as broken bones (fractures) or internal injuries. TREATMENT  Often, the best treatment for a chest contusion is resting, icing, and applying cold compresses to the injured area. Deep breathing exercises may be recommended to reduce the risk of pneumonia. Over-the-counter medicines may also be recommended for pain control. HOME CARE INSTRUCTIONS   Put ice on the injured area.  Put ice in a plastic bag.  Place a towel between your skin and the bag.  Leave the ice on for 15-20 minutes, 03-04 times a day.  Only take over-the-counter or prescription medicines as directed by your caregiver. Your caregiver may recommend avoiding anti-inflammatory medicines (aspirin, ibuprofen, and naproxen) for 48 hours because these medicines may increase bruising.  Rest the injured area.  Perform deep-breathing exercises as directed by your caregiver.  Stop smoking if you smoke.  Do not lift objects over 5 pounds (2.3 kg) for 3 days or longer if recommended by your caregiver. SEEK IMMEDIATE MEDICAL CARE IF:   You have increased bruising or swelling.  You have pain that is getting worse.  You have  difficulty breathing.  You have dizziness, weakness, or fainting.  You have blood in your urine or stool.  You cough up or vomit blood.  Your swelling or pain is not relieved with medicines. MAKE SURE YOU:   Understand these instructions.  Will watch your condition.  Will get help right away if you are not doing well or get worse.   This information is not intended to replace advice given to you by your health care provider. Make sure you discuss any questions you have with your health care provider.   Document Released: 06/16/2001 Document Revised: 06/15/2012 Document Reviewed: 03/14/2012 Elsevier Interactive Patient Education 2016 [employer]  

## 2015-11-19 NOTE — ED Notes (Signed)
States fell Sunday with left lateral/anterio ribs hitting [employer]. Since then having a frequent choky cough and difficult to get a full breath.

## 2015-12-02 DIAGNOSIS — N529 Male erectile dysfunction, unspecified: Secondary | ICD-10-CM | POA: Insufficient documentation

## 2015-12-02 DIAGNOSIS — F1010 Alcohol abuse, uncomplicated: Secondary | ICD-10-CM | POA: Insufficient documentation

## 2015-12-02 DIAGNOSIS — F1090 Alcohol use, unspecified, uncomplicated: Secondary | ICD-10-CM | POA: Insufficient documentation

## 2015-12-02 DIAGNOSIS — M792 Neuralgia and neuritis, unspecified: Secondary | ICD-10-CM | POA: Insufficient documentation

## 2015-12-10 ENCOUNTER — Encounter: Payer: Self-pay | Admitting: Emergency Medicine

## 2015-12-10 ENCOUNTER — Encounter (HOSPITAL_COMMUNITY): Payer: Self-pay | Admitting: *Deleted

## 2015-12-10 ENCOUNTER — Emergency Department
Admission: EM | Admit: 2015-12-10 | Discharge: 2015-12-10 | Disposition: A | Discharge status: Home / Self Care | Payer: BLUE CROSS/BLUE SHIELD | Attending: Emergency Medicine | Admitting: Emergency Medicine

## 2015-12-10 DIAGNOSIS — F10120 Alcohol abuse with intoxication, uncomplicated: Secondary | ICD-10-CM | POA: Diagnosis not present

## 2015-12-10 DIAGNOSIS — R319 Hematuria, unspecified: Secondary | ICD-10-CM | POA: Insufficient documentation

## 2015-12-10 DIAGNOSIS — Z8719 Personal history of other diseases of the digestive system: Secondary | ICD-10-CM | POA: Diagnosis not present

## 2015-12-10 DIAGNOSIS — R251 Tremor, unspecified: Secondary | ICD-10-CM | POA: Insufficient documentation

## 2015-12-10 DIAGNOSIS — E119 Type 2 diabetes mellitus without complications: Secondary | ICD-10-CM

## 2015-12-10 DIAGNOSIS — R000 Tachycardia, unspecified: Secondary | ICD-10-CM | POA: Insufficient documentation

## 2015-12-10 DIAGNOSIS — F1010 Alcohol abuse, uncomplicated: Secondary | ICD-10-CM | POA: Diagnosis not present

## 2015-12-10 DIAGNOSIS — E1140 Type 2 diabetes mellitus with diabetic neuropathy, unspecified: Secondary | ICD-10-CM | POA: Insufficient documentation

## 2015-12-10 DIAGNOSIS — E1165 Type 2 diabetes mellitus with hyperglycemia: Secondary | ICD-10-CM | POA: Insufficient documentation

## 2015-12-10 DIAGNOSIS — Z8739 Personal history of other diseases of the musculoskeletal system and connective tissue: Secondary | ICD-10-CM | POA: Diagnosis not present

## 2015-12-10 DIAGNOSIS — Z79899 Other long term (current) drug therapy: Secondary | ICD-10-CM | POA: Diagnosis not present

## 2015-12-10 DIAGNOSIS — Z008 Encounter for other general examination: Secondary | ICD-10-CM | POA: Diagnosis not present

## 2015-12-10 DIAGNOSIS — Z794 Long term (current) use of insulin: Secondary | ICD-10-CM | POA: Diagnosis not present

## 2015-12-10 DIAGNOSIS — F10239 Alcohol dependence with withdrawal, unspecified: Secondary | ICD-10-CM | POA: Insufficient documentation

## 2015-12-10 NOTE — ED Notes (Signed)
Pt states that he has been drinking heavily x 1 year. States he drinks 6-8 vodka drinks/day. Last drink this afternoon. Denies SI/HI. Requesting alcohol detox.

## 2015-12-10 NOTE — ED Notes (Signed)
Pt reports spoke with [facility] and was told to be there at 8am; instructed on importance of returning for any further concerns and especially for any feelings of withdrawals; pt voices good understanding and agreeance

## 2015-12-10 NOTE — ED Notes (Signed)
Pt now speaking with friend regarding not staying and to go directly to [facility]; pt is on phone now calling [facility] to see if he can go directly there; charge nurse notified

## 2015-12-10 NOTE — ED Notes (Addendum)
Patient ambulatory to triage with steady gait, without difficulty or distress noted; pt reports wanting detox from alcohol; st drinks 8 "alcoholic drinks" daily; st last drink this am; pt denies any c/o or feelings of depression; pt accomp by friend; pt reports that he was sent by his employer's [employer] here for clearance to go to [facility] [place]; explained to pt that we no longer do medical clearance for detox but the ED provider would do an evaluation and give him further resources if needed; also explained importance of avoiding withdrawal symptoms; pt voices good understanding and continues with triage

## 2015-12-10 NOTE — ED Notes (Signed)
No answer for vital sign recheck. 

## 2015-12-11 ENCOUNTER — Emergency Department (HOSPITAL_COMMUNITY)
Admission: EM | Admit: 2015-12-11 | Discharge: 2015-12-12 | Disposition: A | Discharge status: Home / Self Care | Payer: BLUE CROSS/BLUE SHIELD | Attending: Emergency Medicine | Admitting: Emergency Medicine

## 2015-12-11 ENCOUNTER — Emergency Department (HOSPITAL_COMMUNITY)
Admission: EM | Admit: 2015-12-11 | Discharge: 2015-12-11 | Disposition: A | Discharge status: Home / Self Care | Payer: BLUE CROSS/BLUE SHIELD | Source: Home / Self Care

## 2015-12-11 ENCOUNTER — Encounter (HOSPITAL_COMMUNITY): Payer: Self-pay | Admitting: Emergency Medicine

## 2015-12-11 DIAGNOSIS — F1010 Alcohol abuse, uncomplicated: Secondary | ICD-10-CM

## 2015-12-11 DIAGNOSIS — R739 Hyperglycemia, unspecified: Secondary | ICD-10-CM

## 2015-12-11 DIAGNOSIS — R319 Hematuria, unspecified: Secondary | ICD-10-CM

## 2015-12-11 LAB — CBC
HCT: 46.9 % (ref 39.0–52.0)
Hemoglobin: 15.9 g/dL (ref 13.0–17.0)
MCH: 33.1 pg (ref 26.0–34.0)
MCHC: 33.9 g/dL (ref 30.0–36.0)
MCV: 97.7 fL (ref 78.0–100.0)
Platelets: 120 10*3/uL — ABNORMAL LOW (ref 150–400)
RBC: 4.8 MIL/uL (ref 4.22–5.81)
RDW: 12.5 % (ref 11.5–15.5)
WBC: 2.8 10*3/uL — ABNORMAL LOW (ref 4.0–10.5)

## 2015-12-11 LAB — URINALYSIS, ROUTINE W REFLEX MICROSCOPIC
BILIRUBIN URINE: NEGATIVE
Glucose, UA: >1000 mg/dL — AB
Ketones, ur: 15 mg/dL — AB
Leukocytes, UA: NEGATIVE
NITRITE: NEGATIVE
PROTEIN: 100 mg/dL — AB
SPECIFIC GRAVITY, URINE: 1.024 (ref 1.005–1.030)
pH: 6.5 (ref 5.0–8.0)

## 2015-12-11 LAB — COMPREHENSIVE METABOLIC PANEL
ALBUMIN: 3.8 g/dL (ref 3.5–5.0)
ALK PHOS: 117 U/L (ref 38–126)
ALT: 64 U/L — AB (ref 17–63)
AST: 72 U/L — ABNORMAL HIGH (ref 15–41)
Anion gap: 14 (ref 5–15)
BUN: 7 mg/dL (ref 6–20)
CO2: 18 mmol/L — AB (ref 22–32)
CREATININE: 0.69 mg/dL (ref 0.61–1.24)
Calcium: 9.6 mg/dL (ref 8.9–10.3)
Chloride: 105 mmol/L (ref 101–111)
GFR calc Af Amer: >60 mL/min (ref >60)
GFR calc non Af Amer: >60 mL/min (ref >60)
GLUCOSE: 304 mg/dL — AB (ref 65–99)
Potassium: 4.2 mmol/L (ref 3.5–5.1)
SODIUM: 137 mmol/L (ref 135–145)
Total Bilirubin: 1.3 mg/dL — ABNORMAL HIGH (ref 0.3–1.2)
Total Protein: 6.5 g/dL (ref 6.5–8.1)

## 2015-12-11 LAB — RAPID URINE DRUG SCREEN, HOSP PERFORMED
AMPHETAMINES: NOT DETECTED
Barbiturates: NOT DETECTED
Benzodiazepines: NOT DETECTED
Cocaine: NOT DETECTED
Opiates: NOT DETECTED
Tetrahydrocannabinol: NOT DETECTED

## 2015-12-11 LAB — URINE MICROSCOPIC-ADD ON

## 2015-12-11 LAB — ETHANOL: Alcohol, Ethyl (B): 11 mg/dL — ABNORMAL HIGH (ref <5)

## 2015-12-11 MED ORDER — CHLORDIAZEPOXIDE HCL 25 MG PO CAPS: ORAL_CAPSULE | ORAL | Status: DC

## 2015-12-11 MED ORDER — SODIUM CHLORIDE 0.9 % IV BOLUS (SEPSIS)
2000.0000 mL | Freq: Once | INTRAVENOUS | Status: AC
Start: 2015-12-11 — End: 2015-12-11
  Administered 2015-12-11: 2000 mL via INTRAVENOUS

## 2015-12-11 NOTE — ED Provider Notes (Signed)
CSN: [id number]     Arrival date & time 12/11/15  1933 History   First MD Initiated Contact with Patient 12/11/15 2230     Chief Complaint  Patient presents with  . Medical Clearance     (Consider location/radiation/quality/duration/timing/severity/associated sxs/prior Treatment) HPI  45 year old male presents requesting alcohol detox. He has a history of diabetes and diabetic neuropathy. Patient states he's been taking his insulin as prescribed. Has been drinking alcohol heavily for over 6 months, possibly over a year. Patient states that he drinks at least 6 shots every day. Patient states this morning he had at least 2 shots and 2 beers. Patient has never been through withdrawal before. Occasionally has vomiting and headaches, none today. He started drinking because of his diabetic nerve pain in his feet. No current hallucinations or seizures. As for his diabetes he states he has been taking his insulin as prescribed and his hemoglobin A1c was 7 recently. States his HR is always tachycardic. Denies suicidal or homicidal thoughts.  Past Medical History  Diagnosis Date  . Diabetes mellitus without complication (HCC)   . Diabetic neuropathy (HCC)   . Sciatica   . Diverticulitis   . Diverticula, colon    Past Surgical History  Procedure Laterality Date  . Colon surgery    . Hernia repair     Family History  Problem Relation Age of Onset  . Diabetes Mother    Social History  Substance Use Topics  . Smoking status: Never Smoker   . Smokeless tobacco: Never Used  . Alcohol Use: 12.0 oz/week    20 Shots of liquor per week     Comment: 8 drinks/ day    Review of Systems  Constitutional: Negative for fever.  Gastrointestinal: Negative for vomiting and abdominal pain.  Neurological: Negative for headaches.  Psychiatric/Behavioral: Negative for hallucinations, confusion and dysphoric mood.  All other systems reviewed and are negative.     Allergies  Sulfa antibiotics  Home  Medications   Prior to Admission medications   Medication Sig Start Date End Date Taking? Authorizing Provider  albuterol (PROVENTIL HFA;VENTOLIN HFA) 108 (90 BASE) MCG/ACT inhaler Inhale 1-2 puffs into the lungs every 6 (six) hours as needed for wheezing or shortness of breath. 02/27/15  Yes Payton Mccallumrlando Conty, MD  HYDROcodone-acetaminophen (NORCO/VICODIN) 5-325 MG tablet Take 1-2 tablets by mouth every 6 (six) hours as needed for severe pain. 11/19/15  Yes Lutricia FeilWilliam P Roemer, PA-C  insulin detemir (LEVEMIR) 100 UNIT/ML injection Inject 5 Units into the skin at bedtime.    Yes Historical Provider, MD  benzonatate (TESSALON) 200 MG capsule Take 1 capsule (200 mg total) by mouth 3 (three) times daily as needed for cough. Patient not taking: Reported on 12/11/2015 02/27/15   Payton Mccallumrlando Conty, MD   BP 137/99 mmHg  Pulse 120  Temp(Src) 98.4 F (36.9 C) (Oral)  Resp 18  Ht 5\' 11"  (1.803 m)  Wt 190 lb (86.183 kg)  BMI 26.51 kg/m2  SpO2 95% Physical Exam  Constitutional: He is oriented to person, place, and time. He appears well-developed and well-nourished.  HENT:  Head: Normocephalic and atraumatic.  Right Ear: External ear normal.  Left Ear: External ear normal.  Nose: Nose normal.  Eyes: Right eye exhibits no discharge. Left eye exhibits no discharge.  Neck: Neck supple.  Cardiovascular: Regular rhythm, normal heart sounds and intact distal pulses.  Tachycardia present.   Pulmonary/Chest: Effort normal and breath sounds normal.  Abdominal: Soft. There is no tenderness.  Musculoskeletal: He exhibits  no edema.  Neurological: He is alert and oriented to person, place, and time.  Very slight tremor bilaterally  Skin: Skin is warm and dry.  Nursing note and vitals reviewed.   ED Course  Procedures (including critical care time) Labs Review Labs Reviewed  COMPREHENSIVE METABOLIC PANEL - Abnormal; Notable for the following:    CO2 18 (*)    Glucose, Bld 304 (*)    AST 72 (*)    ALT 64 (*)     Total Bilirubin 1.3 (*)    All other components within normal limits  ETHANOL - Abnormal; Notable for the following:    Alcohol, Ethyl (B) 11 (*)    All other components within normal limits  CBC - Abnormal; Notable for the following:    WBC 2.8 (*)    Platelets 120 (*)    All other components within normal limits  URINALYSIS, ROUTINE W REFLEX MICROSCOPIC (NOT AT [facility]) - Abnormal; Notable for the following:    Glucose, UA >1000 (*)    Hgb urine dipstick MODERATE (*)    Ketones, ur 15 (*)    Protein, ur 100 (*)    All other components within normal limits  URINE MICROSCOPIC-ADD ON - Abnormal; Notable for the following:    Squamous Epithelial / LPF 0-5 (*)    Bacteria, UA RARE (*)    All other components within normal limits  URINE RAPID DRUG SCREEN, HOSP PERFORMED    Imaging Review No results found. I have personally reviewed and evaluated these images and lab results as part of my medical decision-making.   EKG Interpretation None      MDM   Final diagnoses:  Hyperglycemia  Alcohol abuse  Hematuria    Patient's workup is significant for hyperglycemia. His bicarbonate is a little low at 18 but he has no anion gap acidosis and only 15 ketones in his urine. This is not consistent with DKA. He was given IV fluids. He is tachycardic but he notes that he is always tachycardic and past chart review shows this is well. His CIWA is a 2. I have low suspicion for acute alcohol withdrawal. Given he is not suicidal, homicidal, or psychotic, I feel outpatient detoxes appropriate. Patient agrees. He will be given a Librium prescription and instructed to follow-up with rehabilitation as soon as possible. Patient has hematuria in urine, discussed following up with PCP for hematuria. Highly doubt rhabdo given there is actual RBCs in his urine.    Pricilla Loveless, MD 12/11/15 [phone]

## 2015-12-11 NOTE — ED Notes (Signed)
EDP at bedside  

## 2015-12-11 NOTE — ED Notes (Signed)
Pt reports he wants detox from alcohol. sts he has been drinking about 8 drinks/day to self medicate d/t neuropathy pain. Last drink today. No other drug use. Denies SI/HI

## 2015-12-11 NOTE — ED Notes (Signed)
Called main lab to get update on UA, stated they would run it

## 2015-12-11 NOTE — ED Notes (Signed)
No answer for room placement.

## 2015-12-11 NOTE — Discharge Instructions (Signed)
Community Resource Guide Outpatient Counseling/Substance Abuse Adult °The United Way’s “211” is a great source of information about community services available.  Access by dialing 2-1-1 from anywhere in [place], or by website -  www.nc211.org.  ° °Other Local Resources (Updated 10/2015) ° °Crisis Hotlines °  °Services  ° °  °Area Served  °[facility] • Crisis Hotline, available 24 hours a day, 7 days a week: 800-939-5911 [place], [place]  ° [facility] • Crisis Hotline, available 24 hours a day, 7 days a week: 866-275-9552 [place], [place]  °[facility] • Suicide Prevention Hotline, available 24 hours a day, 7 days a week: 800-273-8255 [place], [place]  °[facility] ° • Crisis Hotline, available 24 hours a day, 7 days a week: 336-676-6840 [place], [place] °  °[facility] • Crisis Hotline, available 24 hours a day, 7 days a week: 800-256-2452 All °  °[facility] • Crisis Hotline, available 24 hours a day, 7 days a week: 877-626-1772 [place]  ° °Other Local Resources (Updated 10/2015) ° °Outpatient Counseling/ Substance Abuse Programs  °Services  ° °  °Address and Phone Number  °[facility] ° • Options include Individual counseling, group counseling, intensive outpatient program (several hours a day, several days a week) °• Offers depression assessments °• Provides methadone maintenance program 336-333-6860 °[address], [address] °[place], [place] [address] °  °[facility] ° • Offers partial hospitalization/day treatment and DUI/DWI programs °• Accepts Medicare, private insurance [phone] °[address], [address] °[place], [place] [address]  °[facility] ° ° • Services include intensive outpatient program (several hours a day, several days a week), outpatient treatment, DUI/DWI services, family education °• Also has some services specifically for Veterans °• Offers transitional housing   [phone] °[address] °[place], [place] [address] °  °  °[facility] • Accepts Medicare, private pay, and private insurance [phone] °[address] °[place], [place] [address]  °[facility] • Services include individual counseling, substance abuse intensive outpatient program (several hours a day, several days a week), day treatment °• Accepts Medicare, Medicaid, private insurance [phone] °[address] °[place], [place] [address]  °[facility] ° • Offers substance abuse intensive outpatient program (several hours a day, several days a week), partial hospitalization program 336-832-9800 °[address] °[place], [place] [address] ° °[phone] °[address] °[place], [place] [address] ° °[phone] °[address] °[place], [place] [address] ° °[phone] °[address] °[place], [place] [address]  °[facility] • Individual counseling only °• Accepts private insurance only [phone] °[address] °[place], [place] [address]  °[facility] • Methadone maintenance program 800-805-6989 °[address] °[place], [place] [address]  °[facility] • [facility]-In Clinic providing substance abuse and mental health counseling °• Accepts Medicaid, Medicare, private insurance °• Offers sliding scale for uninsured 336-342-8316 °[address] °[place], [place]   °[facility] • Offers individual counseling, and intensive in-home services 336-347-7415 °[address] °[place], [place] [address]  °[facility] • Offers individual counseling, family counseling, group therapy, domestic violence counseling, consumer credit counseling °• Accepts Medicare, Medicaid, private insurance °• Offers sliding scale for uninsured 336-387-6161 °[address] °[place], [place] [address] ° °336-889-6161 °[place], [address] °[place], [place] [address]  °[facility] • Offers individual, family   and group counseling °• 3 locations - [place], [place], and [place] ° 336-899-8800 ° °[address] °[place], [place] [address] ° °[address] °[place], [place] [address] ° °[address] °[place], [place] [address]  °[facility]  ° • Offers psychiatric assessment, 8-week Intensive Outpatient Program (several hours a day, several times a week, daytime or evenings), early recovery group, family Program, medication management °• Private pay or private insurance only [phone], or  °800-659-3381 °[address] °[place], [place] [address]  °[facility] • Offers individual, couples and family counseling °• Accepts Medicaid, private insurance, and sliding scale for uninsured 336-542-2076 °[address] °[place], [place] [address]  °David Fuller, MD • Individual counseling °• Private insurance [phone] °[address] °[place], [place] [address]  °[facility] ° • Offers assessment, substance abuse treatment, and behavioral health treatment [phone] °[address] °[place], [place] [address]  °[facility] • Individual counseling °• Accepts private insurance 336-272-1972 °[address] °[place], [place] [address]  °[facility] • Individual counseling °• Accepts Medicare, private insurance [phone] °[address] °[place], [place] [address]  °[facility]  ° • Offers intensive outpatient program (several hours a day, several times a week) °• Private pay, private insurance [phone] °[address] °[place], [place]  °[facility] • Individual counseling °• Medicare, private insurance [phone] °[address] °[place], [place] [address]  °[facility]  ° • Offers intensive outpatient program (several hours a day, several times a week) and partial hospitalization  program 336-794-3550 °[address] °[place], [place] [address]  °Parrish McKinney, MD • Individual counseling 336-282-1251 °[address] °[place], [place] [address]  °[facility] • Offers Christian counseling to individuals, couples, and families °• Accepts Medicare and private insurance; offers sliding scale for uninsured 336-288-1484 °[address] °[place], [place] [address]  °[facility] • Christian counseling 336-542-2060 °[address] °[place], [place] [address]  °[facility] ° • Offers crisis counseling, individual counseling, group therapy, in-home therapy, domestic violence services, day treatment, DWI services, Community Support Team (CST), Assertive Community Treatment Team (ACTT), substance abuse Intensive Outpatient Program (several hours a day, several times a week) °• 2 locations - [place] and [place] [phone] °[address] °[place], [place] [address] ° °[phone] °[address] °[place], [place] [address]  °[facility]  ° ° • Individual counseling and group therapy °• Accepts private insurance, Medicare, Medicaid [phone] °[address] °[place], [place]  °[facility] • Offers individual and family counseling °• Offers LGBTQ services °• Accepts private insurance and private pay [phone] °[address] °[place], [place] [address]  °[facility]  ° • Offers individual counseling, group therapy, and outpatient detox °• Accepts private insurance [phone] °[address] °[place], [place]  °[facility] • Individual counseling °• Accepts Medicare, private insurance [phone] °[address] °[place], [place] [address]  °[facility] • Individual counseling °• Accepts Medicare, private insurance [phone] °[address] °[place], [place] [address]  °[facility] ° • Offers substance abuse  Intensive Outpatient Program (several hours a day, several times a week) [phone], or °[phone] °[place], [place]  ° °

## 2016-01-30 ENCOUNTER — Encounter: Payer: Self-pay | Admitting: *Deleted

## 2016-01-30 ENCOUNTER — Ambulatory Visit
Admission: EM | Admit: 2016-01-30 | Discharge: 2016-01-30 | Disposition: A | Discharge status: Home / Self Care | Payer: BLUE CROSS/BLUE SHIELD | Attending: Family Medicine | Admitting: Family Medicine

## 2016-01-30 DIAGNOSIS — R609 Edema, unspecified: Secondary | ICD-10-CM

## 2016-01-30 DIAGNOSIS — L259 Unspecified contact dermatitis, unspecified cause: Secondary | ICD-10-CM

## 2016-01-30 DIAGNOSIS — E0842 Diabetes mellitus due to underlying condition with diabetic polyneuropathy: Secondary | ICD-10-CM

## 2016-01-30 MED ORDER — TRIAMCINOLONE ACETONIDE 0.1 % EX CREA: 1.0000 "application " | TOPICAL_CREAM | Freq: Two times a day (BID) | CUTANEOUS | Status: DC

## 2016-01-30 NOTE — ED Notes (Signed)
Pt has hx of IDDM with bilat leg neuropathy. Rx of Gabapentin 3 weeks ago shortly after legs began to swell, pain became worse. Pt now c/o bilat foot, lower leg, and knee pain with edema to right lower leg.

## 2016-01-30 NOTE — Discharge Instructions (Signed)
Complementary and Alternative Medical Therapies for Diabetes Complementary and alternative medicines are health care practices or products that are not always accepted as part of routine medicine. Complementary medicine is used along with routine medicine (medical therapy). Alternative medicine can sometimes be used instead of routine medicine. Some people use these methods to treat diabetes. While some of these therapies may be effective, others may not be. Some may even be harmful. Patients using these methods need to tell their caregiver. It is important to let your caregivers know what you are doing. Some of these therapies are discussed below. For more information, talk with your caregiver. THERAPIES Acupuncture Acupuncture is done by a professional who inserts needles into certain points on the skin. Some scientists believe that this triggers the release of the body's natural painkillers. It has been shown to relieve long-term (chronic) pain. This may help patients with painful nerve damage caused by diabetes. Biofeedback Biofeedback helps a person become more aware of the body's response to pain. It also helps you learn to deal with the pain. This alternative therapy focuses on relaxation and stress-reduction techniques. Thinking of peaceful mental images (guided imagery) is one technique. Some people believe these images can ease their condition. MEDICATIONS Chromium Several studies report that chromium supplements may improve diabetes control. Chromium helps insulin improve its action. Research is not yet certain. Supplements have not been recommended or approved. Caution is needed if you have kidney (renal) problems. Ginseng There are several types of ginseng plants. American ginseng is used for diabetes studies. Those studies have shown some glucose-lowering effects. Those effects have been seen with fasting and after-meal blood glucose levels. They have also been seen in A1c levels (average  blood glucose levels over a 28-month period). More long-term studies are needed before recommendations for use of ginseng can be made. Magnesium Experts have studied the relationship between magnesium and diabetes for many years. But it is not yet fully understood. Studies suggest that a low amount of magnesium may make blood glucose control worse in type 2 diabetes. Research also shows that a low amount may contribute to certain diabetes complications. One study showed that people who consume more magnesium had less risk of type 2 diabetes. Eating whole grains, nuts, and green leafy vegetables raises the magnesium level. Vanadium Vanadium is a compound found in tiny amounts in plants and animals. Early studies showed that vanadium improved blood glucose levels in animals with type 1 and type 2 diabetes. One study found that when given vanadium, those with diabetes were able to decrease their insulin dosage. Researchers still need to learn how it works in the body to discover any side effects, and to find safe dosages. Cinnamon There have been a couple of studies that seem to indicate cinnamon decreases insulin resistance and increases insulin production. By doing so, it may lower blood glucose. Exact doses are unknown, but it may work best when used in combination with other diabetes medicines.   This information is not intended to replace advice given to you by your health care provider. Make sure you discuss any questions you have with your health care provider.   Document Released: 07/19/2007 Document Revised: 12/14/2011 Document Reviewed: 08/01/2009 Elsevier Interactive Patient Education 2016 Elsevier Inc.  Contact Dermatitis Dermatitis is redness, soreness, and swelling (inflammation) of the skin. Contact dermatitis is a reaction to certain substances that touch the skin. There are two types of contact dermatitis:   Irritant contact dermatitis. This type is caused by something that irritates  your skin, such as dry hands from washing them too much. This type does not require previous exposure to the substance for a reaction to occur. This type is more common.  Allergic contact dermatitis. This type is caused by a substance that you are allergic to, such as a nickel allergy or poison ivy. This type only occurs if you have been exposed to the substance (allergen) before. Upon a repeat exposure, your body reacts to the substance. This type is less common. CAUSES  Many different substances can cause contact dermatitis. Irritant contact dermatitis is most commonly caused by exposure to:   Makeup.   Soaps.   Detergents.   Bleaches.   Acids.   Metal salts, such as nickel.  Allergic contact dermatitis is most commonly caused by exposure to:   Poisonous plants.   Chemicals.   Jewelry.   Latex.   Medicines.   Preservatives in products, such as clothing.  RISK FACTORS This condition is more likely to develop in:   People who have jobs that expose them to irritants or allergens.  People who have certain medical conditions, such as asthma or eczema.  SYMPTOMS  Symptoms of this condition may occur anywhere on your body where the irritant has touched you or is touched by you. Symptoms include:  Dryness or flaking.   Redness.   Cracks.   Itching.   Pain or a burning feeling.   Blisters.  Drainage of small amounts of blood or clear fluid from skin cracks. With allergic contact dermatitis, there may also be swelling in areas such as the eyelids, mouth, or genitals.  DIAGNOSIS  This condition is diagnosed with a medical history and physical exam. A patch skin test may be performed to help determine the cause. If the condition is related to your job, you may need to see an occupational medicine specialist. TREATMENT Treatment for this condition includes figuring out what caused the reaction and protecting your skin from further contact. Treatment may  also include:   Steroid creams or ointments. Oral steroid medicines may be needed in more severe cases.  Antibiotics or antibacterial ointments, if a skin infection is present.  Antihistamine lotion or an antihistamine taken by mouth to ease itching.  A bandage (dressing). HOME CARE INSTRUCTIONS Skin Care  Moisturize your skin as needed.   Apply cool compresses to the affected areas.  Try taking a bath with:  Epsom salts. Follow the instructions on the packaging. You can get these at your local pharmacy or grocery store.  Baking soda. Pour a small amount into the bath as directed by your health care provider.  Colloidal oatmeal. Follow the instructions on the packaging. You can get this at your local pharmacy or grocery store.  Try applying baking soda paste to your skin. Stir water into baking soda until it reaches a paste-like consistency.  Do not scratch your skin.  Bathe less frequently, such as every other day.  Bathe in lukewarm water. Avoid using hot water. Medicines  Take or apply over-the-counter and prescription medicines only as told by your health care provider.   If you were prescribed an antibiotic medicine, take or apply your antibiotic as told by your health care provider. Do not stop using the antibiotic even if your condition starts to improve. General Instructions  Keep all follow-up visits as told by your health care provider. This is important.  Avoid the substance that caused your reaction. If you do not know what caused it, keep a journal  to try to track what caused it. Write down:  What you eat.  What cosmetic products you use.  What you drink.  What you wear in the affected area. This includes jewelry.  If you were given a dressing, take care of it as told by your health care provider. This includes when to change and remove it. SEEK MEDICAL CARE IF:   Your condition does not improve with treatment.  Your condition gets  worse.  You have signs of infection such as swelling, tenderness, redness, soreness, or warmth in the affected area.  You have a fever.  You have new symptoms. SEEK IMMEDIATE MEDICAL CARE IF:   You have a severe headache, neck pain, or neck stiffness.  You vomit.  You feel very sleepy.  You notice red streaks coming from the affected area.  Your bone or joint underneath the affected area becomes painful after the skin has healed.  The affected area turns darker.  You have difficulty breathing.   This information is not intended to replace advice given to you by your health care provider. Make sure you discuss any questions you have with your health care provider.   Document Released: 09/18/2000 Document Revised: 06/12/2015 Document Reviewed: 02/06/2015 Elsevier Interactive Patient Education 2016 Elsevier Inc.  Peripheral Neuropathy Peripheral neuropathy is a type of nerve damage. It affects nerves that carry signals between the spinal cord and other parts of the body. These are called peripheral nerves. With peripheral neuropathy, one nerve or a group of nerves may be damaged.  CAUSES  Many things can damage peripheral nerves. For some people with peripheral neuropathy, the cause is unknown. Some causes include:  Diabetes. This is the most common cause of peripheral neuropathy.  Injury to a nerve.  Pressure or stress on a nerve that lasts a long time.  Too little vitamin B. Alcoholism can lead to this.  Infections.  Autoimmune diseases, such as multiple sclerosis and systemic lupus erythematosus.  Inherited nerve diseases.  Some medicines, such as cancer drugs.  Toxic substances, such as lead and mercury.  Too little blood flowing to the legs.  Kidney disease.  Thyroid disease. SIGNS AND SYMPTOMS  Different people have different symptoms. The symptoms you have will depend on which of your nerves is damaged. Common symptoms include:  Loss of feeling  (numbness) in the feet and hands.  Tingling in the feet and hands.  Pain that burns.  Very sensitive skin.  Weakness.  Not being able to move a part of the body (paralysis).  Muscle twitching.  Clumsiness or poor coordination.  Loss of balance.  Not being able to control your bladder.  Feeling dizzy.  Sexual problems. DIAGNOSIS  Peripheral neuropathy is a symptom, not a disease. Finding the cause of peripheral neuropathy can be hard. To figure that out, your health care provider will take a medical history and do a physical exam. A neurological exam will also be done. This involves checking things affected by your brain, spinal cord, and nerves (nervous system). For example, your health care provider will check your reflexes, how you move, and what you can feel.  Other types of tests may also be ordered, such as:  Blood tests.  A test of the fluid in your spinal cord.  Imaging tests, such as CT scans or an MRI.  Electromyography (EMG). This test checks the nerves that control muscles.  Nerve conduction velocity tests. These tests check how fast messages pass through your nerves.  Nerve biopsy. A  small piece of nerve is removed. It is then checked under a microscope. TREATMENT   Medicine is often used to treat peripheral neuropathy. Medicines may include:  Pain-relieving medicines. Prescription or over-the-counter medicine may be suggested.  Antiseizure medicine. This may be used for pain.  Antidepressants. These also may help ease pain from neuropathy.  Lidocaine. This is a numbing medicine. You might wear a patch or be given a shot.  Mexiletine. This medicine is typically used to help control irregular heart rhythms.  Surgery. Surgery may be needed to relieve pressure on a nerve or to destroy a nerve that is causing pain.  Physical therapy to help movement.  Assistive devices to help movement. HOME CARE INSTRUCTIONS   Only take over-the-counter or  prescription medicines as directed by your health care provider. Follow the instructions carefully for any given medicines. Do not take any other medicines without first getting approval from your health care provider.  If you have diabetes, work closely with your health care provider to keep your blood sugar under control.  If you have numbness in your feet:  Check every day for signs of injury or infection. Watch for redness, warmth, and swelling.  Wear padded socks and comfortable shoes. These help protect your feet.  Do not do things that put pressure on your damaged nerve.  Do not smoke. Smoking keeps blood from getting to damaged nerves.  Avoid or limit alcohol. Too much alcohol can cause a lack of B vitamins. These vitamins are needed for healthy nerves.  Develop a good support system. Coping with peripheral neuropathy can be stressful. Talk to a mental health specialist or join a support group if you are struggling.  Follow up with your health care provider as directed. SEEK MEDICAL CARE IF:   You have new signs or symptoms of peripheral neuropathy.  You are struggling emotionally from dealing with peripheral neuropathy.  You have a fever. SEEK IMMEDIATE MEDICAL CARE IF:   You have an injury or infection that is not healing.  You feel very dizzy or begin vomiting.  You have chest pain.  You have trouble breathing.   This information is not intended to replace advice given to you by your health care provider. Make sure you discuss any questions you have with your health care provider.   Document Released: 09/11/2002 Document Revised: 06/03/2011 Document Reviewed: 05/29/2013 Elsevier Interactive Patient Education 2016 Elsevier Inc.  Peripheral Edema You have swelling in your legs (peripheral edema). This swelling is due to excess accumulation of salt and water in your body. Edema may be a sign of heart, kidney or liver disease, or a side effect of a medication. It may  also be due to problems in the leg veins. Elevating your legs and using special support stockings may be very helpful, if the cause of the swelling is due to poor venous circulation. Avoid long periods of standing, whatever the cause. Treatment of edema depends on identifying the cause. Chips, pretzels, pickles and other salty foods should be avoided. Restricting salt in your diet is almost always needed. Water pills (diuretics) are often used to remove the excess salt and water from your body via urine. These medicines prevent the kidney from reabsorbing sodium. This increases urine flow. Diuretic treatment may also result in lowering of potassium levels in your body. Potassium supplements may be needed if you have to use diuretics daily. Daily weights can help you keep track of your progress in clearing your edema. You should call  your caregiver for follow up care as recommended. SEEK IMMEDIATE MEDICAL CARE IF:   You have increased swelling, pain, redness, or heat in your legs.  You develop shortness of breath, especially when lying down.  You develop chest or abdominal pain, weakness, or fainting.  You have a fever.   This information is not intended to replace advice given to you by your health care provider. Make sure you discuss any questions you have with your health care provider.   Document Released: 10/29/2004 Document Revised: 12/14/2011 Document Reviewed: 04/03/2015 Elsevier Interactive Patient Education [employer]

## 2016-01-30 NOTE — ED Provider Notes (Signed)
CSN: [id number]     Arrival date & time 01/30/16  1452 History   First MD Initiated Contact with Patient 01/30/16 1521     Chief Complaint  Patient presents with  . Foot Pain  . Leg Pain  . Leg Swelling  . Knee Pain   (Consider location/radiation/quality/duration/timing/severity/associated sxs/prior Treatment) HPI   This a 45 year old gentleman who is known to this practice who has a history of insulin-dependent diabetes mellitus and peripheral neuropathy. He is under the care of Dr. Corlis Leak who has been prescribing gabapentin for his peripheral neuropathy. Unfortunately the patient has been developing a peripheral edema which she doubled his dosage and since he was not apparently improving he has now cut his gabapentin back down to its original dosage. He does drink 4-6 ounces of alcohol per night and despite her urging to discontinue or decrease, he has not been doing this.Marland KitchenAppears of the compression hose at [facility] and after wearing them has a started breaking out on his lower legs. He states that the lesions are not itchy but they are not resolving. 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 He continues to have the peripheral neuropathy symptoms. He wants to discontinue the gabapentin.      Past Medical History  Diagnosis Date  . Diabetes mellitus without complication (HCC)   . Diabetic neuropathy (HCC)   . Sciatica   . Diverticulitis   . Diverticula, colon    Past Surgical History  Procedure Laterality Date  . Colon surgery    . Hernia repair     Family History  Problem Relation Age of Onset  . Diabetes Mother    Social History  Substance Use Topics  . Smoking status: Never Smoker   . Smokeless tobacco: Never Used  . Alcohol Use: 12.0 oz/week    20 Shots of liquor per week     Comment: 8 drinks/ day    Review of Systems  Constitutional: Positive for activity change. Negative for fever, chills, appetite change and fatigue.   Skin: Positive for rash.  All other systems reviewed and are negative.   Allergies  Sulfa antibiotics  Home Medications   Prior to Admission medications   Medication Sig Start Date End Date Taking? Authorizing Provider  insulin detemir (LEVEMIR) 100 UNIT/ML injection Inject 5 Units into the skin at bedtime.    Yes Historical Provider, MD  albuterol (PROVENTIL HFA;VENTOLIN HFA) 108 (90 BASE) MCG/ACT inhaler Inhale 1-2 puffs into the lungs every 6 (six) hours as needed for wheezing or shortness of breath. 02/27/15   [name], MD  benzonatate (TESSALON) 200 MG capsule Take 1 capsule (200 mg total) by mouth 3 (three) times daily as needed for cough. Patient not taking: Reported on 12/11/2015 02/27/15   [name], MD  chlordiazePOXIDE (LIBRIUM) 25 MG capsule  PO TID x 1D, then 25-50mg  PO BID X 1D, then 25-50mg  PO QD X 1D 12/11/15   Pricilla Loveless, MD  HYDROcodone-acetaminophen (NORCO/VICODIN) 5-325 MG tablet Take 1-2 tablets by mouth every 6 (six) hours as needed for severe pain. 11/19/15   Lutricia Feil, PA-C  triamcinolone cream (KENALOG) 0.1 % Apply 1 application topically 2 (two) times daily. 01/30/16   Lutricia Feil, PA-C   Meds Ordered and Administered this Visit  Medications - No data to display  BP 128/95 mmHg  Pulse 110  Temp(Src) 98 F (36.7 C) (Oral)  Resp 16  Ht  (1.803 m)  Wt 205  lb (92.987 kg)  BMI 28.60 kg/m2  SpO2 98% No data found.   Physical Exam  Constitutional: He is oriented to person, place, and time. He appears well-developed and well-nourished. No distress.  HENT:  Head: Normocephalic and atraumatic.  Eyes: Conjunctivae are normal. Pupils are equal, round, and reactive to light.  Neck: Normal range of motion. Neck supple.  Musculoskeletal: Normal range of motion. He exhibits edema. He exhibits no tenderness.  Neurological: He is alert and oriented to person, place, and time.  Skin: Rash noted. He is not diaphoretic.  Psychiatric: He has  a normal mood and affect. His behavior is normal. Judgment and thought content normal.  Nursing note and vitals reviewed.   ED Course  Procedures (including critical care time)  Labs Review Labs Reviewed - No data to display  Imaging Review No results found.   Visual Acuity Review  Right Eye Distance:   Left Eye Distance:   Bilateral Distance:    Right Eye Near:   Left Eye Near:    Bilateral Near:         MDM   1. Peripheral edema   2. Diabetic polyneuropathy associated with diabetes mellitus due to underlying condition (HCC)   3. Contact dermatitis    Discharge Medication List as of 01/30/2016  4:10 PM    START taking these medications   Details  triamcinolone cream (KENALOG) 0.1 % Apply 1 application topically 2 (two) times daily., Starting 01/30/2016, Until Discontinued, Normal      Plan: 1. Test/x-ray results and diagnosis reviewed with patient 2. rx as per orders; risks, benefits, potential side effects reviewed with patient 3. Recommend supportive treatment with Discontinuing the use of the compressive hose as her is and likely allergy component in the hose. He try another brand with success but should be careful. He needs to follow-up with his primary care physician in his peripheral neuropathy. I have no further suggestions for him other than he may want to consult with a endocrinologist or come better control of his diabetes. I have given him some triamcinolone for the rash she has on his legs. 4. F/u prn if symptoms worsen or don't improve     Lutricia FeilWilliam P Ala Kratz, PA-C 01/30/16 1632

## 2016-02-26 DIAGNOSIS — E1142 Type 2 diabetes mellitus with diabetic polyneuropathy: Secondary | ICD-10-CM | POA: Insufficient documentation

## 2016-03-23 DIAGNOSIS — R000 Tachycardia, unspecified: Secondary | ICD-10-CM | POA: Insufficient documentation

## 2016-03-23 DIAGNOSIS — I5022 Chronic systolic (congestive) heart failure: Secondary | ICD-10-CM | POA: Insufficient documentation

## 2016-05-14 DIAGNOSIS — I209 Angina pectoris, unspecified: Secondary | ICD-10-CM | POA: Insufficient documentation

## 2016-06-22 ENCOUNTER — Encounter: Payer: BLUE CROSS/BLUE SHIELD | Attending: Cardiology | Admitting: *Deleted

## 2016-06-22 VITALS — Ht 72.25 in | Wt 209.5 lb

## 2016-06-22 DIAGNOSIS — E1140 Type 2 diabetes mellitus with diabetic neuropathy, unspecified: Secondary | ICD-10-CM | POA: Insufficient documentation

## 2016-06-22 DIAGNOSIS — Z9861 Coronary angioplasty status: Secondary | ICD-10-CM | POA: Insufficient documentation

## 2016-06-22 DIAGNOSIS — Z955 Presence of coronary angioplasty implant and graft: Secondary | ICD-10-CM

## 2016-06-22 NOTE — Progress Notes (Addendum)
Cardiac Individual Treatment Plan  Patient Details  Name: Franklin Douglas MRN: [id number] Date of Birth: [birth year] Referring Provider:   Flowsheet Row Cardiac Rehab from 06/22/2016 in [facility] Cardiac and Pulmonary Rehab  Referring Provider  Kerry Dory MD      Initial Encounter Date:  Flowsheet Row Cardiac Rehab from 06/22/2016 in [facility] Cardiac and Pulmonary Rehab  Date  06/22/16  Referring Provider  Kerry Dory MD      Visit Diagnosis: S/P coronary artery stent placement  Patient's Home Medications on Admission:  Current Outpatient Prescriptions:  .  atorvastatin (LIPITOR) 80 MG tablet, Take 80 mg by mouth., Disp: , Rfl:  .  clopidogrel (PLAVIX) 75 MG tablet, Take 75 mg by mouth., Disp: , Rfl:  .  clopidogrel (PLAVIX) 75 MG tablet, Take 75 mg by mouth., Disp: , Rfl:  .  furosemide (LASIX) 20 MG tablet, TAKE ONE TABLET BY MOUTH TWICE DAILY, Disp: , Rfl:  .  insulin detemir (LEVEMIR) 100 UNIT/ML injection, Inject 5 Units into the skin at bedtime. , Disp: , Rfl:  .  lisinopril (PRINIVIL,ZESTRIL) 5 MG tablet, TAKE ONE TABLET BY MOUTH ONCE DAILY, Disp: , Rfl:  .  spironolactone (ALDACTONE) 25 MG tablet, Take 12.5 mg by mouth., Disp: , Rfl:  .  albuterol (PROVENTIL HFA;VENTOLIN HFA) 108 (90 BASE) MCG/ACT inhaler, Inhale 1-2 puffs into the lungs every 6 (six) hours as needed for wheezing or shortness of breath. (Patient not taking: Reported on 06/22/2016), Disp: 1 Inhaler, Rfl: 0 .  aspirin EC 81 MG tablet, Take 81 mg by mouth., Disp: , Rfl:  .  benzonatate (TESSALON) 200 MG capsule, Take 1 capsule (200 mg total) by mouth 3 (three) times daily as needed for cough. (Patient not taking: Reported on 06/22/2016), Disp: 30 capsule, Rfl: 0 .  carvedilol (COREG) 12.5 MG tablet, Take 12.5 mg by mouth., Disp: , Rfl:  .  chlordiazePOXIDE (LIBRIUM) 25 MG capsule, 21m PO TID x 1D, then 25-590mPO BID X 1D, then 25-5054mO QD X 1D (Patient not taking: Reported on 06/22/2016), Disp: 10 capsule, Rfl: 0 .   HYDROcodone-acetaminophen (NORCO/VICODIN) 5-325 MG tablet, Take 1-2 tablets by mouth every 6 (six) hours as needed for severe pain. (Patient not taking: Reported on 06/22/2016), Disp: 20 tablet, Rfl: 0 .  triamcinolone cream (KENALOG) 0.1 %, Apply 1 application topically 2 (two) times daily., Disp: 30 g, Rfl: 0  Past Medical History: Past Medical History:  Diagnosis Date  . [place] without [place] ([place] . Diabetic neuropathy ([facility] . Diverticula, colon   . Diverticulitis   . Sciatica     Tobacco Use: History  Smoking Status  . Never Smoker  Smokeless Tobacco  . Never Used    Labs: Recent Review Flowsheet Data    Labs for ITP Cardiac and Pulmonary Rehab Latest Ref Rng & Units 02/20/2015   Cholestrol 0 - 200 mg/dL 256(A)   HDL 35 - 70 mg/dL 41   Trlycerides 40 - 160 mg/dL 463(A)   Hemoglobin A1c - 6.8       Exercise Target Goals: Date: 06/22/16  Exercise Program Goal: Individual exercise prescription set with THRR, safety & activity barriers. Participant demonstrates ability to understand and report RPE using BORG scale, to self-measure pulse accurately, and to acknowledge the importance of the exercise prescription.  Exercise Prescription Goal: Starting with aerobic activity 30 plus minutes a day, 3 days per week for initial exercise prescription. Provide home exercise prescription and guidelines that participant  acknowledges understanding prior to discharge.  Activity Barriers & Risk Stratification:     Activity Barriers & Cardiac Risk Stratification - 06/22/16 1204      Activity Barriers & Cardiac Risk Stratification   Activity Barriers Balance Concerns  neuropathy in both feet   Cardiac Risk Stratification High      6 Minute Walk:     6 Minute Walk    Row Name 06/22/16 1351         6 Minute Walk   Phase Initial     Distance 1300 feet     Walk Time 6 minutes     # of Rest Breaks 0     MPH 2.46     METS 4.42     RPE 9     VO2 Peak  15.47     Symptoms No     Resting HR 87 bpm     Resting BP 120/60     Max Ex. HR 114 bpm     Max Ex. BP 136/64     2 Minute Post BP 126/64        Initial Exercise Prescription:     Initial Exercise Prescription - 06/22/16 1300      Date of Initial Exercise RX and Referring Provider   Date 06/22/16   Referring Provider Kerry Dory MD     Treadmill   MPH 2.5   Grade 2   Minutes 15   METs 3.6     NuStep   Level 3   Minutes 15   METs 2     REL-XR   Level 3   Minutes 15   METs 2     Prescription Details   Frequency (times per week) 3   Duration Progress to 45 minutes of aerobic exercise without signs/symptoms of physical distress     Intensity   THRR 40-80% of Max Heartrate 128-159   Ratings of Perceived Exertion 11-15   Perceived Dyspnea 0-4     Progression   Progression Continue to progress workloads to maintain intensity without signs/symptoms of physical distress.     Resistance Training   Training Prescription Yes   Weight 3 lbs   Reps 10-12      Perform Capillary Blood Glucose checks as needed.  Exercise Prescription Changes:      Exercise Prescription Changes    Row Name 06/22/16 1300             Exercise Review   Progression -  walk test results         Response to Exercise   Blood Pressure (Admit) 120/60       Blood Pressure (Exercise) 136/64       Blood Pressure (Exit) 126/64       Heart Rate (Admit) 98 bpm       Heart Rate (Exercise) 114 bpm       Heart Rate (Exit) 94 bpm       Rating of Perceived Exertion (Exercise) 98       Perceived Dyspnea (Exercise) 9       Symptoms none          Exercise Comments:      Exercise Comments    Row Name 06/22/16 1354           Exercise Comments Franklin Douglas wants to get his strength and stamina back up in order to return to work.          Discharge Exercise Prescription (Final Exercise Prescription Changes):  Exercise Prescription Changes - 06/22/16 1300      Exercise Review    Progression --  walk test results     Response to Exercise   Blood Pressure (Admit) 120/60   Blood Pressure (Exercise) 136/64   Blood Pressure (Exit) 126/64   Heart Rate (Admit) 98 bpm   Heart Rate (Exercise) 114 bpm   Heart Rate (Exit) 94 bpm   Rating of Perceived Exertion (Exercise) 98   Perceived Dyspnea (Exercise) 9   Symptoms none      Nutrition:  Target Goals: Understanding of nutrition guidelines, daily intake of sodium <1553m, cholesterol <2041m calories 30% from fat and 7% or less from saturated fats, daily to have 5 or more servings of fruits and vegetables.  Biometrics:     Pre Biometrics - 06/22/16 1355      Pre Biometrics   Height 6' 0.25" (1.835 m)   Weight 209 lb 8 oz (95 kg)   Waist Circumference 40 inches   Hip Circumference 40 inches   Waist to Hip Ratio 1 %   BMI (Calculated) 28.3   Single Leg Stand 30 seconds       Nutrition Therapy Plan and Nutrition Goals:     Nutrition Therapy & Goals - 06/22/16 1207      Nutrition Therapy   Diet --  DeAdonusaid he has already met with a dietician for his diabetes. He said he does not feel like he needs to meet with the Cardiac REhab REgistered Dietician.    Drug/Food Interactions Statins/Certain Fruits     Intervention Plan   Intervention Prescribe, educate and counsel regarding individualized specific dietary modifications aiming towards targeted core components such as weight, hypertension, lipid management, diabetes, heart failure and other comorbidities.   Expected Outcomes Short Term Goal: Understand basic principles of dietary content, such as calories, fat, sodium, cholesterol and nutrients.;Long Term Goal: Adherence to prescribed nutrition plan.      Nutrition Discharge: Rate Your Plate Scores:     Nutrition Assessments - 06/22/16 1238      Rate Your Plate Scores   Pre Score --  Not done. Patient doesn't want to meet individually with the Cardiac REhab REgisterd Dietician.        Nutrition Goals Re-Evaluation:   Psychosocial: Target Goals: Acknowledge presence or absence of depression, maximize coping skills, provide positive support system. Participant is able to verbalize types and ability to use techniques and skills needed for reducing stress and depression.  Initial Review & Psychosocial Screening:     Initial Psych Review & Screening - 06/22/16 1210      Family Dynamics   Good Support System? Yes     Barriers   Psychosocial barriers to participate in program There are no identifiable barriers or psychosocial needs.      Quality of Life Scores:     Quality of Life - 06/22/16 1238      Quality of Life Scores   Health/Function Pre 16.97 %   Socioeconomic Pre 17.5 %   Psych/Spiritual Pre 17 %   Family Pre 17 %   GLOBAL Pre 17.1 %      PHQ-9: Recent Review Flowsheet Data    [facility]   Decreased Interest 0   Down, Depressed, Hopeless 1   PHQ - 2 Score 1   Altered sleeping 1   Tired, decreased energy 1   Change in appetite 1   Feeling bad or failure about yourself  1   Trouble  concentrating 1   Moving slowly or fidgety/restless 1   Suicidal thoughts 0   PHQ-9 Score 7   Difficult doing work/chores Not difficult at all      Psychosocial Evaluation and Intervention:   Psychosocial Re-Evaluation:   Vocational Rehabilitation: Provide vocational rehab assistance to qualifying candidates.   Vocational Rehab Evaluation & Intervention:     Vocational Rehab - 06/22/16 1205      Initial Vocational Rehab Evaluation & Intervention   Assessment shows need for Vocational Rehabilitation No      Education: Education Goals: Education classes will be provided on a weekly basis, covering required topics. Participant will state understanding/return demonstration of topics presented.  Learning Barriers/Preferences:     Learning Barriers/Preferences - 06/22/16 1204      Learning Barriers/Preferences    Learning Barriers None   Learning Preferences None      Education Topics: General Nutrition Guidelines/Fats and Fiber: -Group instruction provided by verbal, written material, models and posters to present the general guidelines for heart healthy nutrition. Gives an explanation and review of dietary fats and fiber.   Controlling Sodium/Reading Food Labels: -Group verbal and written material supporting the discussion of sodium use in heart healthy nutrition. Review and explanation with models, verbal and written materials for utilization of the food label.   Exercise Physiology & Risk Factors: - Group verbal and written instruction with models to review the exercise physiology of the cardiovascular system and associated critical values. Details cardiovascular disease risk factors and the goals associated with each risk factor.   Aerobic Exercise & Resistance Training: - Gives group verbal and written discussion on the health impact of inactivity. On the components of aerobic and resistive training programs and the benefits of this training and how to safely progress through these programs.   Flexibility, Balance, General Exercise Guidelines: - Provides group verbal and written instruction on the benefits of flexibility and balance training programs. Provides general exercise guidelines with specific guidelines to those with heart or lung disease. Demonstration and skill practice provided.   Stress Management: - Provides group verbal and written instruction about the health risks of elevated stress, cause of high stress, and healthy ways to reduce stress.   Depression: - Provides group verbal and written instruction on the correlation between heart/lung disease and depressed mood, treatment options, and the stigmas associated with seeking treatment.   Anatomy & Physiology of the Heart: - Group verbal and written instruction and models provide basic cardiac anatomy and physiology,  with the coronary electrical and arterial systems. Review of: AMI, Angina, Valve disease, Heart Failure, Cardiac Arrhythmia, Pacemakers, and the ICD.   Cardiac Procedures: - Group verbal and written instruction and models to describe the testing methods done to diagnose heart disease. Reviews the outcomes of the test results. Describes the treatment choices: Medical Management, Angioplasty, or Coronary Bypass Surgery.   Cardiac Medications: - Group verbal and written instruction to review commonly prescribed medications for heart disease. Reviews the medication, class of the drug, and side effects. Includes the steps to properly store meds and maintain the prescription regimen.   Go Sex-Intimacy & Heart Disease, Get SMART - Goal Setting: - Group verbal and written instruction through game format to discuss heart disease and the return to sexual intimacy. Provides group verbal and written material to discuss and apply goal setting through the application of the S.M.A.R.T. Method.   Other Matters of the Heart: - Provides group verbal, written materials and models to describe Heart Failure, Angina, Valve  Disease, and Diabetes in the realm of heart disease. Includes description of the disease process and treatment options available to the cardiac patient.   Exercise & Equipment Safety: - Individual verbal instruction and demonstration of equipment use and safety with use of the equipment. Flowsheet Row Cardiac Rehab from 06/22/2016 in [facility] Cardiac and Pulmonary Rehab  Date  06/22/16  Educator  C. EnterkinRN  Instruction Review Code  1- partially meets, needs review/practice      Infection Prevention: - Provides verbal and written material to individual with discussion of infection control including proper hand washing and proper equipment cleaning during exercise session. Flowsheet Row Cardiac Rehab from 06/22/2016 in [facility] Cardiac and Pulmonary Rehab  Date  06/22/16  Educator  C. Enterkin,  R.N.  Instruction Review Code  2- meets goals/outcomes      Falls Prevention: - Provides verbal and written material to individual with discussion of falls prevention and safety. Flowsheet Row Cardiac Rehab from 06/22/2016 in [facility] Cardiac and Pulmonary Rehab  Date  06/22/16  Educator  C. [place]  Instruction Review Code  2- meets goals/outcomes      Diabetes: - Individual verbal and written instruction to review signs/symptoms of diabetes, desired ranges of glucose level fasting, after meals and with exercise. Advice that pre and post exercise glucose checks will be done for 3 sessions at entry of program. [place] from 06/22/2016 in [facility] Cardiac and Pulmonary Rehab  Date  06/22/16  Educator  C. [place]  Instruction Review Code  1- partially meets, needs review/practice       Knowledge Questionnaire Score:     Knowledge Questionnaire Score - 06/22/16 1204      Knowledge Questionnaire Score   Pre Score 26/28      Core Components/Risk Factors/Patient Goals at Admission:     Personal Goals and Risk Factors at Admission - 06/22/16 1208      Core Components/Risk Factors/Patient Goals on Admission    Weight Management Yes;Weight Loss   Intervention Weight Management: Develop a combined nutrition and exercise program designed to reach desired caloric intake, while maintaining appropriate intake of nutrient and fiber, sodium and fats, and appropriate energy expenditure required for the weight goal.;Weight Management: Provide education and appropriate resources to help participant work on and attain dietary goals.   Admit Weight 209 lb 8 oz (95 kg)   Goal Weight: Short Term 204 lb (92.5 kg)   Goal Weight: Long Term 200 lb (90.7 kg)   Expected Outcomes Short Term: Continue to assess and modify interventions until short term weight is achieved;Long Term: Adherence to nutrition and physical activity/exercise program aimed toward attainment of established weight  goal;Weight Loss: Understanding of general recommendations for a balanced deficit meal plan, which promotes 1-2 lb weight loss per week and includes a negative energy balance of [phone] kcal/d;Weight Maintenance: Understanding of the daily nutrition guidelines, which includes 25-35% calories from fat, 7% or less cal from saturated fats, less than 252m cholesterol, less than 1.5gm of sodium, & 5 or more servings of fruits and vegetables daily;Understanding recommendations for meals to include 15-35% energy as protein, 25-35% energy from fat, 35-60% energy from carbohydrates, less than 2036mof dietary cholesterol, 20-35 gm of total fiber daily;Understanding of distribution of calorie intake throughout the day with the consumption of 4-5 meals/snacks   Increase Strength and Stamina Yes   Intervention Provide advice, education, support and counseling about physical activity/exercise needs.;Develop an individualized exercise prescription for aerobic and resistive training based on initial  evaluation findings, risk stratification, comorbidities and participant's personal goals.   Expected Outcomes Achievement of increased cardiorespiratory fitness and enhanced flexibility, muscular endurance and strength shown through measurements of functional capacity and personal statement of participant.   Diabetes Yes   Intervention Provide education about signs/symptoms and action to take for hypo/hyperglycemia.;Provide education about proper nutrition, including hydration, and aerobic/resistive exercise prescription along with prescribed medications to achieve blood glucose in normal ranges: Fasting glucose 65-99 mg/dL  [place] said his last HBA1C was 8. He said he "bottoms out when he takes his insulin sliding scale as prescribed.    Expected Outcomes Short Term: Participant verbalizes understanding of the signs/symptoms and immediate care of hyper/hypoglycemia, proper foot care and importance of medication,  aerobic/resistive exercise and nutrition plan for blood glucose control.;Long Term: Attainment of HbA1C < 7%.   Heart Failure Yes   Intervention Provide a combined exercise and nutrition program that is supplemented with education, support and counseling about heart failure. Directed toward relieving symptoms such as shortness of breath, decreased exercise tolerance, and extremity edema.   Expected Outcomes Improve functional capacity of life;Short term: Attendance in program 2-3 days a week with increased exercise capacity. Reported lower sodium intake. Reported increased fruit and vegetable intake. Reports medication compliance.;Short term: Daily weights obtained and reported for increase. Utilizing diuretic protocols set by physician.;Long term: Adoption of self-care skills and reduction of barriers for early signs and symptoms recognition and intervention leading to self-care maintenance.   Hypertension Yes   Intervention Provide education on lifestyle modifcations including regular physical activity/exercise, weight management, moderate sodium restriction and increased consumption of fresh fruit, vegetables, and low fat dairy, alcohol moderation, and smoking cessation.;Monitor prescription use compliance.   Expected Outcomes Short Term: Continued assessment and intervention until BP is < 140/1m HG in hypertensive participants. < 130/876mHG in hypertensive participants with diabetes, heart failure or chronic kidney disease.;Long Term: Maintenance of blood pressure at goal levels.   Lipids Yes   Intervention Provide education and support for participant on nutrition & aerobic/resistive exercise along with prescribed medications to achieve LDL <7039mHDL >13m75m Expected Outcomes Short Term: Participant states understanding of desired cholesterol values and is compliant with medications prescribed. Participant is following exercise prescription and nutrition guidelines.;Long Term: Cholesterol  controlled with medications as prescribed, with individualized exercise RX and with personalized nutrition plan. Value goals: LDL < 70mg17mL > 40 mg.      Core Components/Risk Factors/Patient Goals Review:    Core Components/Risk Factors/Patient Goals at Discharge (Final Review):    ITP Comments:     ITP Comments    Row Name 06/22/16 1208 06/22/16 1210         ITP Comments DerekMordecai he has already met with a dietician for his diabetes. He said he does not feel like he needs to meet with the Cardiac REhab REgistered Dietician. DerekAntwuan his last HBA1C was 8. He said he "bottoms out when he takes his insulin sliding scale as prescribed.         Comments: Ready to start Cardiac Rehab.

## 2016-06-22 NOTE — Patient Instructions (Addendum)
Patient Instructions  Patient Details  Name: Franklin Douglas MRN: [id number] Date of Birth: 08/23/1971 Referring Provider:  Marlaine Hindossi, Joseph Stuart, MD  Below are the personal goals you chose as well as exercise and nutrition goals. Our goal is to help you keep on track towards obtaining and maintaining your goals. We will be discussing your progress on these goals with you throughout the program.  Initial Exercise Prescription:     Initial Exercise Prescription - 06/22/16 1300      Date of Initial Exercise RX and Referring Provider   Date 06/22/16   Referring Provider Eppie Gibsonossi, Joseph MD     Treadmill   MPH 2.5   Grade 2   Minutes 15   METs 3.6     NuStep   Level 3   Minutes 15   METs 2     REL-XR   Level 3   Minutes 15   METs 2     Prescription Details   Frequency (times per week) 3   Duration Progress to 45 minutes of aerobic exercise without signs/symptoms of physical distress     Intensity   THRR 40-80% of Max Heartrate 128-159   Ratings of Perceived Exertion 11-15   Perceived Dyspnea 0-4     Progression   Progression Continue to progress workloads to maintain intensity without signs/symptoms of physical distress.     Resistance Training   Training Prescription Yes   Weight 3 lbs   Reps 10-12      Exercise Goals: Frequency: Be able to perform aerobic exercise three times per week working toward 3-5 days per week.  Intensity: Work with a perceived exertion of 11 (fairly light) - 15 (hard) as tolerated. Follow your new exercise prescription and watch for changes in prescription as you progress with the program. Changes will be reviewed with you when they are made.  Duration: You should be able to do 30 minutes of continuous aerobic exercise in addition to a 5 minute warm-up and a 5 minute cool-down routine.  Nutrition Goals: Your personal nutrition goals will be established when you do your nutrition analysis with the dietician.  The following are nutrition  guidelines to follow: Cholesterol < 200mg /day Sodium < 1500mg /day Fiber: Men under 50 yrs - 38 grams per day  Personal Goals:     Personal Goals and Risk Factors at Admission - 06/22/16 1208      Core Components/Risk Factors/Patient Goals on Admission    Weight Management Yes;Weight Loss   Intervention Weight Management: Develop a combined nutrition and exercise program designed to reach desired caloric intake, while maintaining appropriate intake of nutrient and fiber, sodium and fats, and appropriate energy expenditure required for the weight goal.;Weight Management: Provide education and appropriate resources to help participant work on and attain dietary goals.   Admit Weight 209 lb 8 oz (95 kg)   Goal Weight: Short Term 204 lb (92.5 kg)   Goal Weight: Long Term 200 lb (90.7 kg)   Expected Outcomes Short Term: Continue to assess and modify interventions until short term weight is achieved;Long Term: Adherence to nutrition and physical activity/exercise program aimed toward attainment of established weight goal;Weight Loss: Understanding of general recommendations for a balanced deficit meal plan, which promotes 1-2 lb weight loss per week and includes a negative energy balance of [phone] kcal/d;Weight Maintenance: Understanding of the daily nutrition guidelines, which includes 25-35% calories from fat, 7% or less cal from saturated fats, less than 200mg  cholesterol, less than 1.5gm of sodium, &  5 or more servings of fruits and vegetables daily;Understanding recommendations for meals to include 15-35% energy as protein, 25-35% energy from fat, 35-60% energy from carbohydrates, less than 200mg  of dietary cholesterol, 20-35 gm of total fiber daily;Understanding of distribution of calorie intake throughout the day with the consumption of 4-5 meals/snacks   Increase Strength and Stamina Yes   Intervention Provide advice, education, support and counseling about physical activity/exercise  needs.;Develop an individualized exercise prescription for aerobic and resistive training based on initial evaluation findings, risk stratification, comorbidities and participant's personal goals.   Expected Outcomes Achievement of increased cardiorespiratory fitness and enhanced flexibility, muscular endurance and strength shown through measurements of functional capacity and personal statement of participant.   Diabetes Yes   Intervention Provide education about signs/symptoms and action to take for hypo/hyperglycemia.;Provide education about proper nutrition, including hydration, and aerobic/resistive exercise prescription along with prescribed medications to achieve blood glucose in normal ranges: Fasting glucose 65-99 mg/dL  Franklin Douglas said his last RUE4V was 8. He said he "bottoms out when he takes his insulin sliding scale as prescribed.    Expected Outcomes Short Term: Participant verbalizes understanding of the signs/symptoms and immediate care of hyper/hypoglycemia, proper foot care and importance of medication, aerobic/resistive exercise and nutrition plan for blood glucose control.;Long Term: Attainment of HbA1C < 7%.   Heart Failure Yes   Intervention Provide a combined exercise and nutrition program that is supplemented with education, support and counseling about heart failure. Directed toward relieving symptoms such as shortness of breath, decreased exercise tolerance, and extremity edema.   Expected Outcomes Improve functional capacity of life;Short term: Attendance in program 2-3 days a week with increased exercise capacity. Reported lower sodium intake. Reported increased fruit and vegetable intake. Reports medication compliance.;Short term: Daily weights obtained and reported for increase. Utilizing diuretic protocols set by physician.;Long term: Adoption of self-care skills and reduction of barriers for early signs and symptoms recognition and intervention leading to self-care maintenance.    Hypertension Yes   Intervention Provide education on lifestyle modifcations including regular physical activity/exercise, weight management, moderate sodium restriction and increased consumption of fresh fruit, vegetables, and low fat dairy, alcohol moderation, and smoking cessation.;Monitor prescription use compliance.   Expected Outcomes Short Term: Continued assessment and intervention until BP is < 140/110mm HG in hypertensive participants. < 130/65mm HG in hypertensive participants with diabetes, heart failure or chronic kidney disease.;Long Term: Maintenance of blood pressure at goal levels.   Lipids Yes   Intervention Provide education and support for participant on nutrition & aerobic/resistive exercise along with prescribed medications to achieve LDL 70mg , HDL >40mg .   Expected Outcomes Short Term: Participant states understanding of desired cholesterol values and is compliant with medications prescribed. Participant is following exercise prescription and nutrition guidelines.;Long Term: Cholesterol controlled with medications as prescribed, with individualized exercise RX and with personalized nutrition plan. Value goals: LDL < 70mg , HDL > 40 mg.      Tobacco Use Initial Evaluation: History  Smoking Status  . Never Smoker  Smokeless Tobacco  . Never Used    Copy of goals given to participant.

## 2016-06-22 NOTE — Progress Notes (Signed)
Daily Session Note  Patient Details  Name: Franklin Douglas MRN: [id number] Date of Birth: May 02, 1971 Referring Provider:    Encounter Date: 06/22/2016  Check In:     Session Check In - 06/22/16 1205      Check-In   Location [facility]-Cardiac & Pulmonary Rehab   Staff Present Gerlene Burdock, RN, BSN;Susanne Bice, RN, BSN, CCRP;Jessica Luan Pulling, MA, ACSM RCEP, Exercise Physiologist   Supervising physician immediately available to respond to emergencies See telemetry face sheet for immediately available ER MD   Medication changes reported     No   Fall or balance concerns reported    No   Warm-up and Cool-down Not performed (comment)   Resistance Training Performed No   VAD Patient? No     Pain Assessment   Currently in Pain? No/denies         Goals Met:  Personal goals reviewed  Goals Unmet:  Not Applicable  Comments:     Dr. Emily Filbert is Medical Director for [place] and [facility].

## 2016-06-26 ENCOUNTER — Telehealth: Payer: Self-pay | Admitting: *Deleted

## 2016-06-26 NOTE — Telephone Encounter (Signed)
Franklin Douglas left a vm on Cardiac Rehab office phone that he could not attend Cardiac Rehab today.

## 2016-06-29 ENCOUNTER — Encounter: Payer: BLUE CROSS/BLUE SHIELD | Admitting: *Deleted

## 2016-06-29 DIAGNOSIS — Z9861 Coronary angioplasty status: Secondary | ICD-10-CM | POA: Diagnosis not present

## 2016-06-29 DIAGNOSIS — Z955 Presence of coronary angioplasty implant and graft: Secondary | ICD-10-CM

## 2016-06-29 LAB — GLUCOSE, CAPILLARY
GLUCOSE-CAPILLARY: 217 mg/dL — AB (ref 65–99)
Glucose-Capillary: 193 mg/dL — ABNORMAL HIGH (ref 65–99)

## 2016-06-29 NOTE — Progress Notes (Signed)
Daily Session Note  Patient Details  Name: Franklin Douglas MRN: [id number] Date of Birth: 1971-08-15 Referring Provider:   Flowsheet Row Cardiac Rehab from 06/22/2016 in [facility] Cardiac and Pulmonary Rehab  Referring Provider  Kerry Dory MD      Encounter Date: 06/29/2016  Check In:     Session Check In - 06/29/16 0859      Check-In   Location [facility]   Staff Present Heath Lark, RN, BSN, Laveda Norman, BS, ACSM CEP, Exercise Physiologist;Carroll Enterkin, RN, BSN   Supervising physician immediately available to respond to emergencies See telemetry face sheet for immediately available ER MD   Medication changes reported     No   Fall or balance concerns reported    No   Warm-up and Cool-down Performed on first and last piece of equipment   Resistance Training Performed Yes   VAD Patient? No     Pain Assessment   Currently in Pain? No/denies   Multiple Pain Sites No         Goals Met:  Exercise tolerated well Personal goals reviewed No report of cardiac concerns or symptoms Strength training completed today  Goals Unmet:  Not Applicable  Comments: First full day of exercise!  Patient was oriented to gym and equipment including functions, settings, policies, and procedures.  Patient's individual exercise prescription and treatment plan were reviewed.  All starting workloads were established based on the results of the 6 minute walk test done at initial orientation visit.  The plan for exercise progression was also introduced and progression will be customized based on patient's performance and goals.    Dr. Emily Filbert is Medical Director for [place] and [facility].

## 2016-07-01 ENCOUNTER — Encounter: Payer: BLUE CROSS/BLUE SHIELD | Admitting: *Deleted

## 2016-07-01 DIAGNOSIS — Z955 Presence of coronary angioplasty implant and graft: Secondary | ICD-10-CM

## 2016-07-01 DIAGNOSIS — Z9861 Coronary angioplasty status: Secondary | ICD-10-CM | POA: Diagnosis not present

## 2016-07-01 LAB — GLUCOSE, CAPILLARY
GLUCOSE-CAPILLARY: 318 mg/dL — AB (ref 65–99)
GLUCOSE-CAPILLARY: 276 mg/dL — AB (ref 65–99)

## 2016-07-01 NOTE — Progress Notes (Signed)
Daily Session Note  Patient Details  Name: Franklin Douglas MRN: [id number] Date of Birth: Apr 05, 1971 Referring Provider:   Flowsheet Row Cardiac Rehab from 06/22/2016 in [facility] Cardiac and Pulmonary Rehab  Referring Provider  Kerry Dory MD      Encounter Date: 07/01/2016  Check In:     Session Check In - 07/01/16 0853      Check-In   Location [facility]   Staff Present Gerlene Burdock, RN, BSN;Laureen Owens Shark, BS, RRT, Respiratory Dareen Piano, BA, ACSM CEP, Exercise Physiologist   Supervising physician immediately available to respond to emergencies See telemetry face sheet for immediately available ER MD   Medication changes reported     No   Fall or balance concerns reported    No   Warm-up and Cool-down Performed on first and last piece of equipment   Resistance Training Performed Yes   VAD Patient? No     Pain Assessment   Currently in Pain? No/denies         Goals Met:  Proper associated with RPD/PD & O2 Sat Exercise tolerated well No report of cardiac concerns or symptoms  Goals Unmet:  Not Applicable  Comments:     Dr. Emily Filbert is Medical Director for [place] and [facility].

## 2016-07-03 ENCOUNTER — Encounter: Payer: BLUE CROSS/BLUE SHIELD | Admitting: *Deleted

## 2016-07-03 DIAGNOSIS — Z955 Presence of coronary angioplasty implant and graft: Secondary | ICD-10-CM

## 2016-07-03 DIAGNOSIS — Z9861 Coronary angioplasty status: Secondary | ICD-10-CM | POA: Diagnosis not present

## 2016-07-03 LAB — GLUCOSE, CAPILLARY
GLUCOSE-CAPILLARY: 189 mg/dL — AB (ref 65–99)
GLUCOSE-CAPILLARY: 184 mg/dL — AB (ref 65–99)

## 2016-07-03 NOTE — Progress Notes (Signed)
Daily Session Note  Patient Details  Name: Franklin Douglas MRN: [id number] Date of Birth: 08/05/1971 Referring Provider:   Flowsheet Row Cardiac Rehab from 06/22/2016 in [facility] Cardiac and Pulmonary Rehab  Referring Provider  Rossi, Joseph MD      Encounter Date: 07/03/2016  Check In:     Session Check In - 07/03/16 0918      Check-In   Staff Present Susanne Bice, RN, BSN, CCRP;Mary Jo Abernethy, RN, BSN, MA;Stacey Joyce, RRT, RCP, Respiratory Therapist   Supervising physician immediately available to respond to emergencies See telemetry face sheet for immediately available ER MD   Medication changes reported     No   Fall or balance concerns reported    No   Warm-up and Cool-down Performed on first and last piece of equipment   Resistance Training Performed Yes   VAD Patient? No     Pain Assessment   Currently in Pain? No/denies         Goals Met:  Exercise tolerated well No report of cardiac concerns or symptoms Strength training completed today  Goals Unmet:  Not Applicable  Comments: Doing well with exercise prescription progression.    Dr. Mark Miller is Medical Director for [facility] and [facility]. 

## 2016-07-06 ENCOUNTER — Encounter: Payer: BLUE CROSS/BLUE SHIELD | Attending: Cardiology | Admitting: *Deleted

## 2016-07-06 DIAGNOSIS — Z955 Presence of coronary angioplasty implant and graft: Secondary | ICD-10-CM

## 2016-07-06 DIAGNOSIS — Z9861 Coronary angioplasty status: Secondary | ICD-10-CM | POA: Diagnosis not present

## 2016-07-06 DIAGNOSIS — E1140 Type 2 diabetes mellitus with diabetic neuropathy, unspecified: Secondary | ICD-10-CM | POA: Insufficient documentation

## 2016-07-06 NOTE — Progress Notes (Signed)
Daily Session Note  Patient Details  Name: Franklin Douglas MRN: [id number] Date of Birth: [birth year] Referring Provider:   Flowsheet Row Cardiac Rehab from 06/22/2016 in [facility] Cardiac and Pulmonary Rehab  Referring Provider  Kerry Dory MD      Encounter Date: 07/06/2016  Check In:     Session Check In - 07/06/16 0804      Check-In   Location [facility]   Staff Present Heath Lark, RN, BSN, Laveda Norman, BS, ACSM CEP, Exercise Physiologist;Jessica Van [place], [place], ACSM RCEP, Exercise Physiologist   Supervising physician immediately available to respond to emergencies See telemetry face sheet for immediately available ER MD   Medication changes reported     No   Fall or balance concerns reported    No   Warm-up and Cool-down Performed on first and last piece of equipment   Resistance Training Performed Yes   VAD Patient? No     Pain Assessment   Currently in Pain? No/denies   Multiple Pain Sites No         Goals Met:  Independence with exercise equipment Exercise tolerated well Personal goals reviewed No report of cardiac concerns or symptoms Strength training completed today  Goals Unmet:  Not Applicable  Comments: Pt able to follow exercise prescription today without complaint.  Will continue to monitor for progression.    Dr. Emily Filbert is Medical Director for [place] and [facility].

## 2016-07-08 ENCOUNTER — Encounter: Payer: BLUE CROSS/BLUE SHIELD | Admitting: *Deleted

## 2016-07-08 DIAGNOSIS — Z955 Presence of coronary angioplasty implant and graft: Secondary | ICD-10-CM

## 2016-07-08 DIAGNOSIS — Z9861 Coronary angioplasty status: Secondary | ICD-10-CM | POA: Diagnosis not present

## 2016-07-08 NOTE — Progress Notes (Signed)
Daily Session Note  Patient Details  Name: MOMIN MISKO MRN: [id number] Date of Birth: December 17, 1970 Referring Provider:   Flowsheet Row Cardiac Rehab from 06/22/2016 in [facility] Cardiac and Pulmonary Rehab  Referring Provider  Kerry Dory MD      Encounter Date: 07/08/2016  Check In:     Session Check In - 07/08/16 0956      Check-In   Location [facility]   Staff Present Alberteen Sam, MA, ACSM RCEP, Exercise Physiologist;Amanda Oletta Darter, BA, ACSM CEP, Exercise Physiologist;Carroll Enterkin, RN, BSN   Supervising physician immediately available to respond to emergencies See telemetry face sheet for immediately available ER MD   Medication changes reported     No   Fall or balance concerns reported    No   Warm-up and Cool-down Performed on first and last piece of equipment   Resistance Training Performed Yes   VAD Patient? No     Pain Assessment   Currently in Pain? No/denies   Multiple Pain Sites No         Goals Met:  Independence with exercise equipment Exercise tolerated well No report of cardiac concerns or symptoms Strength training completed today  Goals Unmet:  Not Applicable  Comments: Pt able to follow exercise prescription today without complaint.  Will continue to monitor for progression. Started HIIT on NuStep today.   Dr. Emily Filbert is Medical Director for [place] and [facility].

## 2016-07-09 DIAGNOSIS — Z955 Presence of coronary angioplasty implant and graft: Secondary | ICD-10-CM

## 2016-07-09 DIAGNOSIS — Z9861 Coronary angioplasty status: Secondary | ICD-10-CM | POA: Diagnosis not present

## 2016-07-09 NOTE — Progress Notes (Signed)
Daily Session Note  Patient Details  Name: Franklin Douglas MRN: [id number] Date of Birth: 13-Mar-1971 Referring Provider:   Flowsheet Row Cardiac Rehab from 06/22/2016 in [facility] Cardiac and Pulmonary Rehab  Referring Provider  Kerry Dory MD      Encounter Date: 07/09/2016  Check In:     Session Check In - 07/09/16 1015      Check-In   Location [facility]   Staff Present Alberteen Sam, MA, ACSM RCEP, Exercise Physiologist;Sahmir Weatherbee Oletta Darter, BA, ACSM CEP, Exercise Physiologist;Carroll Enterkin, RN, BSN   Supervising physician immediately available to respond to emergencies See telemetry face sheet for immediately available ER MD   Medication changes reported     No   Fall or balance concerns reported    No   Warm-up and Cool-down Performed on first and last piece of equipment   Resistance Training Performed Yes   VAD Patient? No     Pain Assessment   Currently in Pain? No/denies   Multiple Pain Sites No           Exercise Prescription Changes - 07/08/16 1400      Exercise Review   Progression Yes     Response to Exercise   Blood Pressure (Admit) 142/72   Blood Pressure (Exercise) 130/64   Blood Pressure (Exit) 126/60   Heart Rate (Admit) 80 bpm   Heart Rate (Exercise) 133 bpm   Heart Rate (Exit) 102 bpm   Rating of Perceived Exertion (Exercise) 18   Symptoms none   Duration Progress to 45 minutes of aerobic exercise without signs/symptoms of physical distress   Intensity THRR unchanged     Progression   Progression Continue to progress workloads to maintain intensity without signs/symptoms of physical distress.   Average METs 7.26     Resistance Training   Training Prescription Yes   Weight 5 lbs   Reps 10-12     Interval Training   Interval Training Yes   Equipment NuStep   Comments 4mn off 30s on (level 7-level 9)     Treadmill   MPH 3.5   Grade 4   Minutes 15   METs 5.61     NuStep   Level 9   Minutes 15   METs 8.9     REL-XR   Level 7   Minutes 15   METs 4      Goals Met:  Independence with exercise equipment Exercise tolerated well No report of cardiac concerns or symptoms Strength training completed today  Goals Unmet:  Not Applicable  Comments: Pt able to follow exercise prescription today without complaint.  Will continue to monitor for progression.Reviewed home exercise with pt today.  Pt plans to walk and use stationary bike for exercise.  Reviewed THR, pulse, RPE, sign and symptoms, NTG use, and when to call 911 or MD.  Also discussed weather considerations and indoor options.  Pt voiced understanding.    Dr. MEmily Filbertis Medical Director for [place].

## 2016-07-13 ENCOUNTER — Encounter: Payer: BLUE CROSS/BLUE SHIELD | Admitting: *Deleted

## 2016-07-13 DIAGNOSIS — Z955 Presence of coronary angioplasty implant and graft: Secondary | ICD-10-CM

## 2016-07-13 DIAGNOSIS — Z9861 Coronary angioplasty status: Secondary | ICD-10-CM | POA: Diagnosis not present

## 2016-07-13 NOTE — Progress Notes (Signed)
Daily Session Note  Patient Details  Name: Franklin Douglas MRN: [id number] Date of Birth: [birth year] Referring Provider:   Flowsheet Row Cardiac Rehab from 06/22/2016 in [facility] Cardiac and Pulmonary Rehab  Referring Provider  Kerry Dory MD      Encounter Date: 07/13/2016  Check In:     Session Check In - 07/13/16 0804      Check-In   Location [facility]   Staff Present Alberteen Sam, MA, ACSM RCEP, Exercise Physiologist;Kelly Amedeo Plenty, BS, ACSM CEP, Exercise Physiologist;Carroll Enterkin, RN, BSN   Supervising physician immediately available to respond to emergencies See telemetry face sheet for immediately available ER MD   Medication changes reported     No   Fall or balance concerns reported    No   Warm-up and Cool-down Performed on first and last piece of equipment   Resistance Training Performed Yes   VAD Patient? No     Pain Assessment   Currently in Pain? No/denies   Multiple Pain Sites No         Goals Met:  Independence with exercise equipment Exercise tolerated well No report of cardiac concerns or symptoms Strength training completed today  Goals Unmet:  Not Applicable  Comments: Pt able to follow exercise prescription today without complaint.  Will continue to monitor for progression.    Dr. Emily Filbert is Medical Director for [place] and [facility].

## 2016-07-15 ENCOUNTER — Encounter: Payer: BLUE CROSS/BLUE SHIELD | Admitting: *Deleted

## 2016-07-15 ENCOUNTER — Encounter: Payer: Self-pay | Admitting: *Deleted

## 2016-07-15 DIAGNOSIS — Z9861 Coronary angioplasty status: Secondary | ICD-10-CM | POA: Diagnosis not present

## 2016-07-15 DIAGNOSIS — Z955 Presence of coronary angioplasty implant and graft: Secondary | ICD-10-CM

## 2016-07-15 NOTE — Progress Notes (Signed)
Daily Session Note  Patient Details  Name: Franklin Douglas MRN: [id number] Date of Birth: [birth year] Referring Provider:   Flowsheet Row Cardiac Rehab from 06/22/2016 in [facility] Cardiac and Pulmonary Rehab  Referring Provider  Kerry Dory MD      Encounter Date: 07/15/2016  Check In:     Session Check In - 07/15/16 0945      Check-In   Location [facility] Pulmonary Re[facility]   Staff Present Alberteen Sam, MA, ACSM RCEP, Exercise Physiologist;Susanne Bice, RN, BSN, CCRP;Laureen Owens Shark, BS, RRT, Respiratory Therapist   Supervising physician immediately available to respond to emergencies See telemetry face sheet for immediately available ER MD   Medication changes reported     No   Fall or balance concerns reported    No   Warm-up and Cool-down Performed on first and last piece of equipment   Resistance Training Performed Yes   VAD Patient? No     Pain Assessment   Currently in Pain? No/denies   Multiple Pain Sites No         Goals Met:  Independence with exercise equipment Exercise tolerated well No report of cardiac concerns or symptoms Strength training completed today  Goals Unmet:  Not Applicable  Comments: Pt able to follow exercise prescription today without complaint.  Will continue to monitor for progression.    Dr. Emily Filbert is Medical Director for [place] and [facility].

## 2016-07-15 NOTE — Progress Notes (Signed)
Cardiac Individual Treatment Plan  Patient Details  Name: Franklin Douglas MRN: [id number] Date of Birth: 1970/12/08 Referring Provider:   Flowsheet Row Cardiac Rehab from 06/22/2016 in [facility] Cardiac and Pulmonary Rehab  Referring Provider  Kerry Dory MD      Initial Encounter Date:  Flowsheet Row Cardiac Rehab from 06/22/2016 in [facility] Cardiac and Pulmonary Rehab  Date  06/22/16  Referring Provider  Kerry Dory MD      Visit Diagnosis: S/P coronary artery stent placement  Patient's Home Medications on Admission:  Current Outpatient Prescriptions:  .  albuterol (PROVENTIL HFA;VENTOLIN HFA) 108 (90 BASE) MCG/ACT inhaler, Inhale 1-2 puffs into the lungs every 6 (six) hours as needed for wheezing or shortness of breath. (Patient not taking: Reported on 06/22/2016), Disp: 1 Inhaler, Rfl: 0 .  aspirin EC 81 MG tablet, Take 81 mg by mouth., Disp: , Rfl:  .  atorvastatin (LIPITOR) 80 MG tablet, Take 80 mg by mouth., Disp: , Rfl:  .  benzonatate (TESSALON) 200 MG capsule, Take 1 capsule (200 mg total) by mouth 3 (three) times daily as needed for cough. (Patient not taking: Reported on 06/22/2016), Disp: 30 capsule, Rfl: 0 .  carvedilol (COREG) 12.5 MG tablet, Take 12.5 mg by mouth., Disp: , Rfl:  .  chlordiazePOXIDE (LIBRIUM) 25 MG capsule, 65m PO TID x 1D, then 25-583mPO BID X 1D, then 25-5061mO QD X 1D (Patient not taking: Reported on 06/22/2016), Disp: 10 capsule, Rfl: 0 .  clopidogrel (PLAVIX) 75 MG tablet, Take 75 mg by mouth., Disp: , Rfl:  .  clopidogrel (PLAVIX) 75 MG tablet, Take 75 mg by mouth., Disp: , Rfl:  .  furosemide (LASIX) 20 MG tablet, TAKE ONE TABLET BY MOUTH TWICE DAILY, Disp: , Rfl:  .  HYDROcodone-acetaminophen (NORCO/VICODIN) 5-325 MG tablet, Take 1-2 tablets by mouth every 6 (six) hours as needed for severe pain. (Patient not taking: Reported on 06/22/2016), Disp: 20 tablet, Rfl: 0 .  insulin detemir (LEVEMIR) 100 UNIT/ML injection, Inject 5 Units into the skin at  bedtime. , Disp: , Rfl:  .  lisinopril (PRINIVIL,ZESTRIL) 5 MG tablet, TAKE ONE TABLET BY MOUTH ONCE DAILY, Disp: , Rfl:  .  spironolactone (ALDACTONE) 25 MG tablet, Take 12.5 mg by mouth., Disp: , Rfl:  .  triamcinolone cream (KENALOG) 0.1 %, Apply 1 application topically 2 (two) times daily., Disp: 30 g, Rfl: 0  Past Medical History: Past Medical History:  Diagnosis Date  . Diabetes mellitus without complication ([facility] . Diabetic neuropathy ([facility] . Diverticula, colon   . Diverticulitis   . Sciatica     Tobacco Use: History  Smoking Status  . Never Smoker  Smokeless Tobacco  . Never Used    Labs: Recent Review Flowsheet Data    Labs for ITP Cardiac and Pulmonary Rehab Latest Ref Rng & Units 02/20/2015   Cholestrol 0 - 200 mg/dL 256(A)   HDL 35 - 70 mg/dL 41   Trlycerides 40 - 160 mg/dL 463(A)   Hemoglobin A1c - 6.8       Exercise Target Goals:    Exercise Program Goal: Individual exercise prescription set with THRR, safety & activity barriers. Participant demonstrates ability to understand and report RPE using BORG scale, to self-measure pulse accurately, and to acknowledge the importance of the exercise prescription.  Exercise Prescription Goal: Starting with aerobic activity 30 plus minutes a day, 3 days per week for initial exercise prescription. Provide home exercise prescription and guidelines that participant  acknowledges understanding prior to discharge.  Activity Barriers & Risk Stratification:     Activity Barriers & Cardiac Risk Stratification - 06/22/16 1204      Activity Barriers & Cardiac Risk Stratification   Activity Barriers Balance Concerns  neuropathy in both feet   Cardiac Risk Stratification High      6 Minute Walk:     6 Minute Walk    Row Name 06/22/16 1351         6 Minute Walk   Phase Initial     Distance 1300 feet     Walk Time 6 minutes     # of Rest Breaks 0     MPH 2.46     METS 4.42     RPE 9     VO2 Peak 15.47      Symptoms No     Resting HR 87 bpm     Resting BP 120/60     Max Ex. HR 114 bpm     Max Ex. BP 136/64     2 Minute Post BP 126/64        Initial Exercise Prescription:     Initial Exercise Prescription - 06/22/16 1300      Date of Initial Exercise RX and Referring Provider   Date 06/22/16   Referring Provider Kerry Dory MD     Treadmill   MPH 2.5   Grade 2   Minutes 15   METs 3.6     NuStep   Level 3   Minutes 15   METs 2     REL-XR   Level 3   Minutes 15   METs 2     Prescription Details   Frequency (times per week) 3   Duration Progress to 45 minutes of aerobic exercise without signs/symptoms of physical distress     Intensity   THRR 40-80% of Max Heartrate 128-159   Ratings of Perceived Exertion 11-15   Perceived Dyspnea 0-4     Progression   Progression Continue to progress workloads to maintain intensity without signs/symptoms of physical distress.     Resistance Training   Training Prescription Yes   Weight 3 lbs   Reps 10-12      Perform Capillary Blood Glucose checks as needed.  Exercise Prescription Changes:     Exercise Prescription Changes    Row Name 06/22/16 1300 07/08/16 1400 07/09/16 1000         Exercise Review   Progression -  walk test results Yes  -       Response to Exercise   Blood Pressure (Admit) 120/60 142/72  -     Blood Pressure (Exercise) 136/64 130/64  -     Blood Pressure (Exit) 126/64 126/60  -     Heart Rate (Admit) 98 bpm 80 bpm  -     Heart Rate (Exercise) 114 bpm 133 bpm  -     Heart Rate (Exit) 94 bpm 102 bpm  -     Rating of Perceived Exertion (Exercise) 98 18  -     Perceived Dyspnea (Exercise) 9  -  -     Symptoms none none  -     Duration  - Progress to 45 minutes of aerobic exercise without signs/symptoms of physical distress  -     Intensity  - THRR unchanged  -       Progression   Progression  - Continue to progress workloads to maintain intensity without signs/symptoms of  physical  distress.  -     Average METs  - 7.26  -       Resistance Training   Training Prescription  - Yes  -     Weight  - 5 lbs  -     Reps  - 10-12  -       Interval Training   Interval Training  - Yes  -     Equipment  - NuStep  -     Comments  - 21mn Douglas 30s on (level 7-level 9)  -       Treadmill   MPH  - 3.5  -     Grade  - 4  -     Minutes  - 15  -     METs  - 5.61  -       NuStep   Level  - 9  -     Minutes  - 15  -     METs  - 8.9  -       REL-XR   Level  - 7  -     Minutes  - 15  -     METs  - 4  -       Home Exercise Plan   Plans to continue exercise at  -  - Home     Frequency  -  - Add 4 additional days to program exercise sessions.  Walks and has stationary bike at home        Exercise Comments:     Exercise Comments    Row Name 06/22/16 1354 06/29/16 0901 07/08/16 0957 07/09/16 1018     Exercise Comments DThurlowwants to get his strength and stamina back up in order to return to work. First full day of exercise!  Patient was oriented to gym and equipment including functions, settings, policies, and procedures.  Patient's individual exercise prescription and treatment plan were reviewed.  All starting workloads were established based on the results of the 6 minute walk test done at initial orientation visit.  The plan for exercise progression was also introduced and progression will be customized based on patient's performance and goals. Franklin Douglas Douglas to a good start with exercise.  He started HIIT on NuStep today and felt that he had a good workout!  We will continue to monitor his progress. Reviewed home exercise with pt today.  Pt plans to walk and use stationary bike for exercise.  Reviewed THR, pulse, RPE, sign and symptoms, NTG use, and when to call 911 or MD.  Also discussed weather considerations and indoor options.  Pt voiced understanding.       Discharge Exercise Prescription (Final Exercise Prescription Changes):     Exercise Prescription Changes -  07/09/16 1000      Home Exercise Plan   Plans to continue exercise at Home   Frequency Add 4 additional days to program exercise sessions.  Walks and has stationary bike at home      Nutrition:  Target Goals: Understanding of nutrition guidelines, daily intake of sodium <15047m cholesterol <20038mcalories 30% from fat and 7% or less from saturated fats, daily to have 5 or more servings of fruits and vegetables.  Biometrics:     Pre Biometrics - 06/22/16 1355      Pre Biometrics   Height 6' 0.25" (1.835 m)   Weight 209 lb 8 oz (95 kg)   Waist Circumference 40 inches   Hip  Circumference 40 inches   Waist to Hip Ratio 1 %   BMI (Calculated) 28.3   Single Leg Stand 30 seconds       Nutrition Therapy Plan and Nutrition Goals:     Nutrition Therapy & Goals - 06/22/16 1207      Nutrition Therapy   Diet --  Franklin Douglas said he has already met with a dietician for his diabetes. He said he does not feel like he needs to meet with the Cardiac REhab REgistered Dietician.    Drug/Food Interactions Statins/Certain Fruits     Intervention Plan   Intervention Prescribe, educate and counsel regarding individualized specific dietary modifications aiming towards targeted core components such as weight, hypertension, lipid management, diabetes, heart failure and other comorbidities.   Expected Outcomes Short Term Goal: Understand basic principles of dietary content, such as calories, fat, sodium, cholesterol and nutrients.;Long Term Goal: Adherence to prescribed nutrition plan.      Nutrition Discharge: Rate Your Plate Scores:     Nutrition Assessments - 06/22/16 1238      Rate Your Plate Scores   Pre Score --  Not done. Patient doesn't want to meet individually with the Cardiac REhab REgisterd Dietician.       Nutrition Goals Re-Evaluation:   Psychosocial: Target Goals: Acknowledge presence or absence of depression, maximize coping skills, provide positive support system.  Participant is able to verbalize types and ability to use techniques and skills needed for reducing stress and depression.  Initial Review & Psychosocial Screening:     Initial Psych Review & Screening - 06/22/16 1210      Family Dynamics   Good Support System? Yes     Barriers   Psychosocial barriers to participate in program There are no identifiable barriers or psychosocial needs.      Quality of Life Scores:     Quality of Life - 06/22/16 1238      Quality of Life Scores   Health/Function Pre 16.97 %   Socioeconomic Pre 17.5 %   Psych/Spiritual Pre 17 %   Family Pre 17 %   GLOBAL Pre 17.1 %      PHQ-9: Recent Review Flowsheet Data    Depression screen [facility] 2/9 06/22/2016   Decreased Interest 0   Down, Depressed, Hopeless 1   PHQ - 2 Score 1   Altered sleeping 1   Tired, decreased energy 1   Change in appetite 1   Feeling bad or failure about yourself  1   Trouble concentrating 1   Moving slowly or fidgety/restless 1   Suicidal thoughts 0   PHQ-9 Score 7   Difficult doing work/chores Not difficult at all      Psychosocial Evaluation and Intervention:     Psychosocial Evaluation - 07/08/16 0919      Psychosocial Evaluation & Interventions   Interventions Stress management education;Encouraged to exercise with the program and follow exercise prescription   Comments Counselor met with Franklin Douglas (Mr. B) today for initial psychosocial evaluation.  He is a 45 year old who had surgery in August for (2) stents to be inserted.  He also has congestive heart failure.  Mr. B has a good support system with a significant other who has been with him for the past 21 years.  He has (3) adult children who are in [place].  Mr. B struggles with diabetes and neuropathy in his feet in addition to Cardiact health issues.  He has some initial problems with sleep at night occasionally but  states he gets at least 6 hours most nights.  He denies a history of depression or anxiety or  current symptoms.  Mr. B states his mood is positive currently due to getting out and exercising more, but he is typically more melancholy.  He admits to some stress with his workplace "hassle" over when he will return to work and finances as a result; as they are holding pay checks currently waiting on "red tape" to clear.  This is frustrating for Mr. B since his Dr. has not cleared him yet to return to work.  His goals are to increase his stamina and strength to be able to return to work.  Mr. B plans to continue exercising consistently by walking and using his bike at home.  He will benefit from the stress management component of this program.        Psychosocial Re-Evaluation:   Vocational Rehabilitation: Provide vocational rehab assistance to qualifying candidates.   Vocational Rehab Evaluation & Intervention:     Vocational Rehab - 06/22/16 1205      Initial Vocational Rehab Evaluation & Intervention   Assessment shows need for Vocational Rehabilitation No      Education: Education Goals: Education classes will be provided on a weekly basis, covering required topics. Participant will state understanding/return demonstration of topics presented.  Learning Barriers/Preferences:     Learning Barriers/Preferences - 06/22/16 1204      Learning Barriers/Preferences   Learning Barriers None   Learning Preferences None      Education Topics: General Nutrition Guidelines/Fats and Fiber: -Group instruction provided by verbal, written material, models and posters to present the general guidelines for heart healthy nutrition. Gives an explanation and review of dietary fats and fiber.   Controlling Sodium/Reading Food Labels: -Group verbal and written material supporting the discussion of sodium use in heart healthy nutrition. Review and explanation with models, verbal and written materials for utilization of the food label. Flowsheet Row Cardiac Rehab from 07/13/2016 in [facility] Cardiac  and Pulmonary Rehab  Date  06/29/16  Educator  CR  Instruction Review Code  2- meets goals/outcomes      Exercise Physiology & Risk Factors: - Group verbal and written instruction with models to review the exercise physiology of the cardiovascular system and associated critical values. Details cardiovascular disease risk factors and the goals associated with each risk factor. Flowsheet Row Cardiac Rehab from 07/13/2016 in [facility] Cardiac and Pulmonary Rehab  Date  07/06/16  Educator  [facility]  Instruction Review Code  2- meets goals/outcomes      Aerobic Exercise & Resistance Training: - Gives group verbal and written discussion on the health impact of inactivity. On the components of aerobic and resistive training programs and the benefits of this training and how to safely progress through these programs. Flowsheet Row Cardiac Rehab from 07/13/2016 in [facility] Cardiac and Pulmonary Rehab  Date  07/08/16  Educator  [facility]  Instruction Review Code  2- meets goals/outcomes      Flexibility, Balance, General Exercise Guidelines: - Provides group verbal and written instruction on the benefits of flexibility and balance training programs. Provides general exercise guidelines with specific guidelines to those with heart or lung disease. Demonstration and skill practice provided. Flowsheet Row Cardiac Rehab from 07/13/2016 in [facility] Cardiac and Pulmonary Rehab  Date  07/13/16  Educator  [facility]  Instruction Review Code  2- meets goals/outcomes      Stress Management: - Provides group verbal and written instruction about the health risks of  elevated stress, cause of high stress, and healthy ways to reduce stress.   Depression: - Provides group verbal and written instruction on the correlation between heart/lung disease and depressed mood, treatment options, and the stigmas associated with seeking treatment.   Anatomy & Physiology of the Heart: - Group verbal and written instruction and models provide basic  cardiac anatomy and physiology, with the coronary electrical and arterial systems. Review of: AMI, Angina, Valve disease, Heart Failure, Cardiac Arrhythmia, Pacemakers, and the ICD.   Cardiac Procedures: - Group verbal and written instruction and models to describe the testing methods done to diagnose heart disease. Reviews the outcomes of the test results. Describes the treatment choices: Medical Management, Angioplasty, or Coronary Bypass Surgery.   Cardiac Medications: - Group verbal and written instruction to review commonly prescribed medications for heart disease. Reviews the medication, class of the drug, and side effects. Includes the steps to properly store meds and maintain the prescription regimen.   Go Sex-Intimacy & Heart Disease, Get SMART - Goal Setting: - Group verbal and written instruction through game format to discuss heart disease and the return to sexual intimacy. Provides group verbal and written material to discuss and apply goal setting through the application of the S.M.A.R.T. Method.   Other Matters of the Heart: - Provides group verbal, written materials and models to describe Heart Failure, Angina, Valve Disease, and Diabetes in the realm of heart disease. Includes description of the disease process and treatment options available to the cardiac patient.   Exercise & Equipment Safety: - Individual verbal instruction and demonstration of equipment use and safety with use of the equipment. Flowsheet Row Cardiac Rehab from 07/13/2016 in [facility] Cardiac and Pulmonary Rehab  Date  06/22/16  Educator  C. EnterkinRN  Instruction Review Code  1- partially meets, needs review/practice      Infection Prevention: - Provides verbal and written material to individual with discussion of infection control including proper hand washing and proper equipment cleaning during exercise session. Flowsheet Row Cardiac Rehab from 07/13/2016 in [facility] Cardiac and Pulmonary Rehab  Date   06/22/16  Educator  C. Enterkin, R.N.  Instruction Review Code  2- meets goals/outcomes      Falls Prevention: - Provides verbal and written material to individual with discussion of falls prevention and safety. Flowsheet Row Cardiac Rehab from 07/13/2016 in [facility] Cardiac and Pulmonary Rehab  Date  06/22/16  Educator  C. [place]  Instruction Review Code  2- meets goals/outcomes      Diabetes: - Individual verbal and written instruction to review signs/symptoms of diabetes, desired ranges of glucose level fasting, after meals and with exercise. Advice that pre and post exercise glucose checks will be done for 3 sessions at entry of program. [place] from 07/13/2016 in [facility] Cardiac and Pulmonary Rehab  Date  06/22/16  Educator  C. [place]  Instruction Review Code  1- partially meets, needs review/practice       Knowledge Questionnaire Score:     Knowledge Questionnaire Score - 06/22/16 1204      Knowledge Questionnaire Score   Pre Score 26/28      Core Components/Risk Factors/Patient Goals at Admission:     Personal Goals and Risk Factors at Admission - 06/22/16 1208      Core Components/Risk Factors/Patient Goals on Admission    Weight Management Yes;Weight Loss   Intervention Weight Management: Develop a combined nutrition and exercise program designed to reach desired caloric intake, while maintaining appropriate intake of nutrient  and fiber, sodium and fats, and appropriate energy expenditure required for the weight goal.;Weight Management: Provide education and appropriate resources to help participant work on and attain dietary goals.   Admit Weight 209 lb 8 oz (95 kg)   Goal Weight: Short Term 204 lb (92.5 kg)   Goal Weight: Long Term 200 lb (90.7 kg)   Expected Outcomes Short Term: Continue to assess and modify interventions until short term weight is achieved;Long Term: Adherence to nutrition and physical activity/exercise program aimed  toward attainment of established weight goal;Weight Loss: Understanding of general recommendations for a balanced deficit meal plan, which promotes 1-2 lb weight loss per week and includes a negative energy balance of [phone] kcal/d;Weight Maintenance: Understanding of the daily nutrition guidelines, which includes 25-35% calories from fat, 7% or less cal from saturated fats, less than 239m cholesterol, less than 1.5gm of sodium, & 5 or more servings of fruits and vegetables daily;Understanding recommendations for meals to include 15-35% energy as protein, 25-35% energy from fat, 35-60% energy from carbohydrates, less than 2041mof dietary cholesterol, 20-35 gm of total fiber daily;Understanding of distribution of calorie intake throughout the day with the consumption of 4-5 meals/snacks   Increase Strength and Stamina Yes   Intervention Provide advice, education, support and counseling about physical activity/exercise needs.;Develop an individualized exercise prescription for aerobic and resistive training based on initial evaluation findings, risk stratification, comorbidities and participant's personal goals.   Expected Outcomes Achievement of increased cardiorespiratory fitness and enhanced flexibility, muscular endurance and strength shown through measurements of functional capacity and personal statement of participant.   Diabetes Yes   Intervention Provide education about signs/symptoms and action to take for hypo/hyperglycemia.;Provide education about proper nutrition, including hydration, and aerobic/resistive exercise prescription along with prescribed medications to achieve blood glucose in normal ranges: Fasting glucose 65-99 mg/dL  Franklin Douglas his last HBA1C was 8. He said he "bottoms out when he takes his insulin sliding scale as prescribed.    Expected Outcomes Short Term: Participant verbalizes understanding of the signs/symptoms and immediate care of hyper/hypoglycemia, proper foot care and  importance of medication, aerobic/resistive exercise and nutrition plan for blood glucose control.;Long Term: Attainment of HbA1C < 7%.   Heart Failure Yes   Intervention Provide a combined exercise and nutrition program that is supplemented with education, support and counseling about heart failure. Directed toward relieving symptoms such as shortness of breath, decreased exercise tolerance, and extremity edema.   Expected Outcomes Improve functional capacity of life;Short term: Attendance in program 2-3 days a week with increased exercise capacity. Reported lower sodium intake. Reported increased fruit and vegetable intake. Reports medication compliance.;Short term: Daily weights obtained and reported for increase. Utilizing diuretic protocols set by physician.;Long term: Adoption of self-care skills and reduction of barriers for early signs and symptoms recognition and intervention leading to self-care maintenance.   Hypertension Yes   Intervention Provide education on lifestyle modifcations including regular physical activity/exercise, weight management, moderate sodium restriction and increased consumption of fresh fruit, vegetables, and low fat dairy, alcohol moderation, and smoking cessation.;Monitor prescription use compliance.   Expected Outcomes Short Term: Continued assessment and intervention until BP is < 140/9053mG in hypertensive participants. < 130/39m82m in hypertensive participants with diabetes, heart failure or chronic kidney disease.;Long Term: Maintenance of blood pressure at goal levels.   Lipids Yes   Intervention Provide education and support for participant on nutrition & aerobic/resistive exercise along with prescribed medications to achieve LDL <70mg83mL >40mg.27mxpected Outcomes Short  Term: Participant states understanding of desired cholesterol values and is compliant with medications prescribed. Participant is following exercise prescription and nutrition guidelines.;Long  Term: Cholesterol controlled with medications as prescribed, with individualized exercise RX and with personalized nutrition plan. Value goals: LDL < 61m, HDL > 40 mg.      Core Components/Risk Factors/Patient Goals Review:      Goals and Risk Factor Review    Row Name 07/06/16 1023             Core Components/Risk Factors/Patient Goals Review   Personal Goals Review Weight Management/Obesity;Sedentary;Increase Strength and Stamina;Diabetes;Lipids;Hypertension;Heart Failure       Review DNerois Douglas to a good start with rehab.  He is already riding his recumbent bike and walking at home as well.  His blood sugars have been good and staying between 150-180.  He is not checking his blood pressure at home, but they have been good when here.  He is weighing daily, but was up some today.  He was surprised at how much salt is in diet soda and is trying to cut back.  He has not had any problems with his statin medicaiton.       Expected Outcomes Franklin Douglas continue to come to exercise and education classes for risk factor modification.          Core Components/Risk Factors/Patient Goals at Discharge (Final Review):      Goals and Risk Factor Review - 07/06/16 1023      Core Components/Risk Factors/Patient Goals Review   Personal Goals Review Weight Management/Obesity;Sedentary;Increase Strength and Stamina;Diabetes;Lipids;Hypertension;Heart Failure   Review Franklin Douglas Douglas to a good start with rehab.  He is already riding his recumbent bike and walking at home as well.  His blood sugars have been good and staying between 150-180.  He is not checking his blood pressure at home, but they have been good when here.  He is weighing daily, but was up some today.  He was surprised at how much salt is in diet soda and is trying to cut back.  He has not had any problems with his statin medicaiton.   Expected Outcomes Franklin Douglas continue to come to exercise and education classes for risk factor  modification.      ITP Comments:     ITP Comments    Row Name 06/22/16 1208 06/22/16 1210 07/15/16 1122       ITP Comments DKaylubsaid he has already met with a dietician for his diabetes. He said he does not feel like he needs to meet with the Cardiac REhab REgistered Dietician. DTemesgensaid his last HBA1C was 8. He said he "bottoms out when he takes his insulin sliding scale as prescribed. 30 day review. Continue with ITP unless changes noted by Medical Director at signature of review.        Comments:

## 2016-07-17 ENCOUNTER — Encounter: Payer: BLUE CROSS/BLUE SHIELD | Admitting: *Deleted

## 2016-07-17 DIAGNOSIS — Z955 Presence of coronary angioplasty implant and graft: Secondary | ICD-10-CM

## 2016-07-17 DIAGNOSIS — Z9861 Coronary angioplasty status: Secondary | ICD-10-CM | POA: Diagnosis not present

## 2016-07-17 NOTE — Progress Notes (Signed)
Daily Session Note  Patient Details  Name: TREASURE INGRUM MRN: [id number] Date of Birth: 1971-05-04 Referring Provider:   Flowsheet Row Cardiac Rehab from 06/22/2016 in [facility] Cardiac and Pulmonary Rehab  Referring Provider  Kerry Dory MD      Encounter Date: 07/17/2016  Check In:     Session Check In - 07/17/16 0857      Check-In   Location [facility]-Cardiac & Pulmonary Rehab   Staff Present Heath Lark, RN, BSN, CCRP;Dezire Turk, RN, Levie Heritage, MA, ACSM RCEP, Exercise Physiologist   Supervising physician immediately available to respond to emergencies See telemetry face sheet for immediately available ER MD   Medication changes reported     No   Fall or balance concerns reported    No   Warm-up and Cool-down Performed on first and last piece of equipment   Resistance Training Performed Yes   VAD Patient? No     Pain Assessment   Currently in Pain? No/denies         Goals Met:  Proper associated with RPD/PD & O2 Sat Exercise tolerated well  Goals Unmet:  Not Applicable  Comments:     Dr. Emily Filbert is Medical Director for [place] and [facility].

## 2016-07-20 ENCOUNTER — Encounter: Payer: BLUE CROSS/BLUE SHIELD | Admitting: *Deleted

## 2016-07-20 DIAGNOSIS — Z9861 Coronary angioplasty status: Secondary | ICD-10-CM | POA: Diagnosis not present

## 2016-07-20 DIAGNOSIS — Z955 Presence of coronary angioplasty implant and graft: Secondary | ICD-10-CM

## 2016-07-20 NOTE — Progress Notes (Signed)
Daily Session Note  Patient Details  Name: Franklin Douglas MRN: [id number] Date of Birth: [birth year] Referring Provider:   Flowsheet Row Cardiac Rehab from 06/22/2016 in [facility] Cardiac and Pulmonary Rehab  Referring Provider  Kerry Dory MD      Encounter Date: 07/20/2016  Check In:     Session Check In - 07/20/16 0810      Check-In   Location [facility]-Cardiac & Pulmonary Rehab   Staff Present Gerlene Burdock, RN, Moises Blood, BS, ACSM CEP, Exercise Physiologist;Jessica Luan Pulling, [place], ACSM RCEP, Exercise Physiologist   Supervising physician immediately available to respond to emergencies See telemetry face sheet for immediately available ER MD   Medication changes reported     No   Fall or balance concerns reported    No   Warm-up and Cool-down Performed on first and last piece of equipment   Resistance Training Performed Yes   VAD Patient? No     Pain Assessment   Currently in Pain? No/denies   Multiple Pain Sites No         Goals Met:  Independence with exercise equipment Exercise tolerated well No report of cardiac concerns or symptoms Strength training completed today  Goals Unmet:  Not Applicable  Comments: Pt able to follow exercise prescription today without complaint.  Will continue to monitor for progression.    Dr. Emily Filbert is Medical Director for [place] and [facility].

## 2016-07-22 ENCOUNTER — Encounter: Payer: BLUE CROSS/BLUE SHIELD | Admitting: *Deleted

## 2016-07-22 DIAGNOSIS — Z955 Presence of coronary angioplasty implant and graft: Secondary | ICD-10-CM

## 2016-07-22 DIAGNOSIS — Z9861 Coronary angioplasty status: Secondary | ICD-10-CM | POA: Diagnosis not present

## 2016-07-22 NOTE — Progress Notes (Signed)
Daily Session Note  Patient Details  Name: Franklin Douglas MRN: [id number] Date of Birth: 22-Mar-1971 Referring Provider:   Flowsheet Row Cardiac Rehab from 06/22/2016 in [facility] Cardiac and Pulmonary Rehab  Referring Provider  Kerry Dory MD      Encounter Date: 07/22/2016  Check In:     Session Check In - 07/22/16 0842      Check-In   Location [facility]-Cardiac & Pulmonary Rehab   Staff Present Alberteen Sam, MA, ACSM RCEP, Exercise Physiologist;Susanne Bice, RN, BSN, Lance Sell, BA, ACSM CEP, Exercise Physiologist   Supervising physician immediately available to respond to emergencies See telemetry face sheet for immediately available ER MD   Medication changes reported     No   Fall or balance concerns reported    No   Warm-up and Cool-down Performed on first and last piece of equipment   Resistance Training Performed Yes   VAD Patient? No     Pain Assessment   Currently in Pain? No/denies   Multiple Pain Sites No         Goals Met:  Independence with exercise equipment Exercise tolerated well No report of cardiac concerns or symptoms Strength training completed today  Goals Unmet:  Not Applicable  Comments: Pt able to follow exercise prescription today without complaint.  Will continue to monitor for progression.    Dr. Emily Filbert is Medical Director for [place] and [facility].

## 2016-07-24 ENCOUNTER — Encounter: Payer: BLUE CROSS/BLUE SHIELD | Admitting: *Deleted

## 2016-07-24 DIAGNOSIS — Z9861 Coronary angioplasty status: Secondary | ICD-10-CM | POA: Diagnosis not present

## 2016-07-24 DIAGNOSIS — Z955 Presence of coronary angioplasty implant and graft: Secondary | ICD-10-CM

## 2016-07-24 NOTE — Progress Notes (Signed)
Daily Session Note  Patient Details  Name: Franklin Douglas MRN: [id number] Date of Birth: 10/04/1971 Referring Provider:   Flowsheet Row Cardiac Rehab from 06/22/2016 in [facility] Cardiac and Pulmonary Rehab  Referring Provider  Rossi, Joseph MD      Encounter Date: 07/24/2016  Check In:     Session Check In - 07/24/16 0901      Check-In   Location [facility]-Cardiac & Pulmonary Rehab   Staff Present Mary Jo Abernethy, RN, BSN, MA;Jessica Hawkins, MA, ACSM RCEP, Exercise Physiologist   Supervising physician immediately available to respond to emergencies See telemetry face sheet for immediately available ER MD   Medication changes reported     No   Fall or balance concerns reported    No   Warm-up and Cool-down Performed on first and last piece of equipment   Resistance Training Performed Yes   VAD Patient? No     Pain Assessment   Currently in Pain? No/denies         Goals Met:  Proper associated with RPD/PD & O2 Sat Exercise tolerated well  Goals Unmet:  Not Applicable  Comments:     Dr. Mark Miller is Medical Director for [facility] and [facility]. 

## 2016-07-29 DIAGNOSIS — Z955 Presence of coronary angioplasty implant and graft: Secondary | ICD-10-CM

## 2016-07-29 DIAGNOSIS — Z9861 Coronary angioplasty status: Secondary | ICD-10-CM | POA: Diagnosis not present

## 2016-07-29 NOTE — Progress Notes (Signed)
Daily Session Note  Patient Details  Name: Franklin Douglas MRN: [id number] Date of Birth: June 09, 1971 Referring Provider:   Flowsheet Row Cardiac Rehab from 06/22/2016 in [facility] Cardiac and Pulmonary Rehab  Referring Provider  Kerry Dory MD      Encounter Date: 07/29/2016  Check In:     Session Check In - 07/29/16 0803      Check-In   Location [facility]-Cardiac & Pulmonary Rehab   Staff Present Heath Lark, RN, BSN, CCRP;Jessica Luan [place], MA, ACSM RCEP, Exercise Physiologist;Nolene [place] Oletta Darter, BA, ACSM CEP, Exercise Physiologist   Supervising physician immediately available to respond to emergencies See telemetry face sheet for immediately available ER MD   Medication changes reported     No   Fall or balance concerns reported    No   Warm-up and Cool-down Performed on first and last piece of equipment   Resistance Training Performed Yes   VAD Patient? No     Pain Assessment   Currently in Pain? No/denies   Multiple Pain Sites No         Goals Met:  Independence with exercise equipment Exercise tolerated well No report of cardiac concerns or symptoms Strength training completed today  Goals Unmet:  Not Applicable  Comments: Pt able to follow exercise prescription today without complaint.  Will continue to monitor for progression.    Dr. Emily Filbert is Medical Director for [place] and [facility].

## 2016-07-31 ENCOUNTER — Encounter: Payer: BLUE CROSS/BLUE SHIELD | Admitting: *Deleted

## 2016-07-31 DIAGNOSIS — Z9861 Coronary angioplasty status: Secondary | ICD-10-CM | POA: Diagnosis not present

## 2016-07-31 DIAGNOSIS — Z955 Presence of coronary angioplasty implant and graft: Secondary | ICD-10-CM

## 2016-07-31 NOTE — Progress Notes (Signed)
Daily Session Note  Patient Details  Name: Franklin Douglas MRN: [id number] Date of Birth: 1971-07-16 Referring Provider:   Flowsheet Row Cardiac Rehab from 06/22/2016 in [facility] Cardiac and Pulmonary Rehab  Referring Provider  Kerry Dory MD      Encounter Date: 07/31/2016  Check In:     Session Check In - 07/31/16 0910      Check-In   Location [facility]-Cardiac & Pulmonary Rehab   Staff Present Gerlene Burdock, RN, Levie Heritage, MA, ACSM RCEP, Exercise Physiologist;Susanne Bice, RN, BSN, CCRP   Medication changes reported     No   Fall or balance concerns reported    No   Resistance Training Performed Yes     Pain Assessment   Currently in Pain? No/denies         Goals Met:  Proper associated with RPD/PD & O2 Sat Exercise tolerated well  Goals Unmet:  Not Applicable  Comments:     Dr. Emily Filbert is Medical Director for [place] and [facility].

## 2016-08-05 ENCOUNTER — Encounter: Payer: BLUE CROSS/BLUE SHIELD | Attending: Cardiology

## 2016-08-05 DIAGNOSIS — Z9861 Coronary angioplasty status: Secondary | ICD-10-CM | POA: Insufficient documentation

## 2016-08-05 DIAGNOSIS — E1140 Type 2 diabetes mellitus with diabetic neuropathy, unspecified: Secondary | ICD-10-CM | POA: Insufficient documentation

## 2016-08-12 ENCOUNTER — Encounter: Payer: Self-pay | Admitting: *Deleted

## 2016-08-12 DIAGNOSIS — Z955 Presence of coronary angioplasty implant and graft: Secondary | ICD-10-CM

## 2016-08-12 NOTE — Progress Notes (Signed)
Cardiac Individual Treatment Plan  Patient Details  Name: Franklin Douglas Date of Birth: [birth year] Referring Provider:   Flowsheet Row Cardiac Rehab from 06/22/2016 in [facility] Cardiac and Pulmonary Rehab  Referring Provider  Kerry Dory MD      Initial Encounter Date:  Flowsheet Row Cardiac Rehab from 06/22/2016 in [facility] Cardiac and Pulmonary Rehab  Date  06/22/16  Referring Provider  Kerry Dory MD      Visit Diagnosis: S/P coronary artery stent placement  Patient's Home Medications on Admission:  Current Outpatient Prescriptions:  .  albuterol (PROVENTIL HFA;VENTOLIN HFA) 108 (90 BASE) MCG/ACT inhaler, Inhale 1-2 puffs into the lungs every 6 (six) hours as needed for wheezing or shortness of breath. (Patient not taking: Reported on 06/22/2016), Disp: 1 Inhaler, Rfl: 0 .  aspirin EC 81 MG tablet, Take 81 mg by mouth., Disp: , Rfl:  .  atorvastatin (LIPITOR) 80 MG tablet, Take 80 mg by mouth., Disp: , Rfl:  .  benzonatate (TESSALON) 200 MG capsule, Take 1 capsule (200 mg total) by mouth 3 (three) times daily as needed for cough. (Patient not taking: Reported on 06/22/2016), Disp: 30 capsule, Rfl: 0 .  carvedilol (COREG) 12.5 MG tablet, Take 12.5 mg by mouth., Disp: , Rfl:  .  chlordiazePOXIDE (LIBRIUM) 25 MG capsule, 35m PO TID x 1D, then 25-582mPO BID X 1D, then 25-5080mO QD X 1D (Patient not taking: Reported on 06/22/2016), Disp: 10 capsule, Rfl: 0 .  clopidogrel (PLAVIX) 75 MG tablet, Take 75 mg by mouth., Disp: , Rfl:  .  furosemide (LASIX) 20 MG tablet, TAKE ONE TABLET BY MOUTH TWICE DAILY, Disp: , Rfl:  .  HYDROcodone-acetaminophen (NORCO/VICODIN) 5-325 MG tablet, Take 1-2 tablets by mouth every 6 (six) hours as needed for severe pain. (Patient not taking: Reported on 06/22/2016), Disp: 20 tablet, Rfl: 0 .  insulin detemir (LEVEMIR) 100 UNIT/ML injection, Inject 5 Units into the skin at bedtime. , Disp: , Rfl:  .  lisinopril (PRINIVIL,ZESTRIL) 5 MG tablet, TAKE ONE  TABLET BY MOUTH ONCE DAILY, Disp: , Rfl:  .  spironolactone (ALDACTONE) 25 MG tablet, Take 12.5 mg by mouth., Disp: , Rfl:  .  triamcinolone cream (KENALOG) 0.1 %, Apply 1 application topically 2 (two) times daily., Disp: 30 g, Rfl: 0  Past Medical History: Past Medical History:  Diagnosis Date  . Diabetes mellitus without complication (HCCIsle . Diabetic neuropathy (HCCPetronila . Diverticula, colon   . Diverticulitis   . Sciatica     Tobacco Use: History  Smoking Status  . Never Smoker  Smokeless Tobacco  . Never Used    Labs: Recent Review Flowsheet Data    Labs for ITP Cardiac and Pulmonary Rehab Latest Ref Rng & Units 02/20/2015   Cholestrol 0 - 200 mg/dL 256(A)   HDL 35 - 70 mg/dL 41   Trlycerides 40 - 160 mg/dL 463(A)   Hemoglobin A1c - 6.8       Exercise Target Goals:    Exercise Program Goal: Individual exercise prescription set with THRR, safety & activity barriers. Participant demonstrates ability to understand and report RPE using BORG scale, to self-measure pulse accurately, and to acknowledge the importance of the exercise prescription.  Exercise Prescription Goal: Starting with aerobic activity 30 plus minutes a day, 3 days per week for initial exercise prescription. Provide home exercise prescription and guidelines that participant acknowledges understanding prior to discharge.  Activity Barriers & Risk Stratification:     Activity  Barriers & Cardiac Risk Stratification - 06/22/16 1204      Activity Barriers & Cardiac Risk Stratification   Activity Barriers Balance Concerns  neuropathy in both feet   Cardiac Risk Stratification High      6 Minute Walk:     6 Minute Walk    Row Name 06/22/16 1351         6 Minute Walk   Phase Initial     Distance 1300 feet     Walk Time 6 minutes     # of Rest Breaks 0     MPH 2.46     METS 4.42     RPE 9     VO2 Peak 15.47     Symptoms No     Resting HR 87 bpm     Resting BP 120/60     Max Ex. HR 114  bpm     Max Ex. BP 136/64     2 Minute Post BP 126/64        Initial Exercise Prescription:     Initial Exercise Prescription - 06/22/16 1300      Date of Initial Exercise RX and Referring Provider   Date 06/22/16   Referring Provider Kerry Dory MD     Treadmill   MPH 2.5   Grade 2   Minutes 15   METs 3.6     NuStep   Level 3   Minutes 15   METs 2     REL-XR   Level 3   Minutes 15   METs 2     Prescription Details   Frequency (times per week) 3   Duration Progress to 45 minutes of aerobic exercise without signs/symptoms of physical distress     Intensity   THRR 40-80% of Max Heartrate 128-159   Ratings of Perceived Exertion 11-15   Perceived Dyspnea 0-4     Progression   Progression Continue to progress workloads to maintain intensity without signs/symptoms of physical distress.     Resistance Training   Training Prescription Yes   Weight 3 lbs   Reps 10-12      Perform Capillary Blood Glucose checks as needed.  Exercise Prescription Changes:     Exercise Prescription Changes    Row Name 06/22/16 1300 07/08/16 1400 07/09/16 1000 07/22/16 1400 08/05/16 1500     Exercise Review   Progression -  walk test results Yes  - Yes Yes     Response to Exercise   Blood Pressure (Admit) 120/60 142/72  - 126/64 122/64   Blood Pressure (Exercise) 136/64 130/64  - 146/76 126/64   Blood Pressure (Exit) 126/64 126/60  - 112/60 104/60   Heart Rate (Admit) 98 bpm 80 bpm  - 83 bpm 97 bpm   Heart Rate (Exercise) 114 bpm 133 bpm  - 138 bpm 135 bpm   Heart Rate (Exit) 94 bpm 102 bpm  - 95 bpm 115 bpm   Rating of Perceived Exertion (Exercise) 98 18  - 16 15   Perceived Dyspnea (Exercise) 9  -  -  -  -   Symptoms none none  - none none   Comments  -  -  - Home Exercise Guidelines given 07/09/16 Home Exercise Guidelines given 07/09/16   Duration  - Progress to 45 minutes of aerobic exercise without signs/symptoms of physical distress  - Progress to 45 minutes of aerobic  exercise without signs/symptoms of physical distress Progress to 45 minutes of aerobic exercise without signs/symptoms  of physical distress   Intensity  - THRR unchanged  - THRR unchanged THRR unchanged     Progression   Progression  - Continue to progress workloads to maintain intensity without signs/symptoms of physical distress.  - Continue to progress workloads to maintain intensity without signs/symptoms of physical distress. Continue to progress workloads to maintain intensity without signs/symptoms of physical distress.   Average METs  - 7.26  - 8.3 9.27     Resistance Training   Training Prescription  - Yes  - Yes Yes   Weight  - 5 lbs  - 7 lbs 7 lbs   Reps  - 10-12  - 10-12 10-12     Interval Training   Interval Training  - Yes  - Yes Yes   Equipment  - NuStep  - Treadmill;NuStep;REL-XR Treadmill;NuStep;REL-XR   Comments  - 450mn off 30s on (level 7-level 9)  - 296m off 30s on and program on treadmill 50m350moff 30s on and program on treadmill     Treadmill   MPH  - 3.5  - 4  running 4   Grade  - 4  - 4 4   Minutes  - 15  - 15 15   METs  - 5.61  - 8.2 8.2     NuStep   Level  - 9  - 9 9   Minutes  - 15  - 15 15   METs  - 8.9  - 9.2 10.1     REL-XR   Level  - 7  - 7 7   Minutes  - 15  - 15 15   METs  - 4  - 7.5 9.5     Home Exercise Plan   Plans to continue exercise at  -  [place]  -  - Add 4 additional days to program exercise sessions.  Walks and has stationary bike at home Add 4 additional days to program exercise sessions.  Walks and has stationary bike at home Add 4 additional days to program exercise sessions.  Walks and has stationary bike at home      Exercise Comments:     Exercise Comments    Row Name 06/22/16 1354 06/29/16 0901 07/08/16 0957 07/09/16 1018 07/17/16 1012   Exercise Comments DerMercurynts to get his strength and stamina back up in order to return to work. First full day of exercise!  Patient was oriented to gym and  equipment including functions, settings, policies, and procedures.  Patient's individual exercise prescription and treatment plan were reviewed.  All starting workloads were established based on the results of the 6 minute walk test done at initial orientation visit.  The plan for exercise progression was also introduced and progression will be customized based on patient's performance and goals. DerMelecio off to a good start with exercise.  He started HIIT on NuStep today and felt that he had a good workout!  We will continue to monitor his progress. Reviewed home exercise with pt today.  Pt plans to walk and use stationary bike for exercise.  Reviewed THR, pulse, RPE, sign and symptoms, NTG use, and when to call 911 or MD.  Also discussed weather considerations and indoor options.  Pt voiced understanding. Reviewed METs average and discussed progression with pt today.   Row Name 07/22/16 1453 08/05/16 1550         Exercise Comments DerVerdisntinues to do well with exercise. He is hiking some  and riding his bike daily at home.  He is enjoying the challenge of the HIIT.  He has even started using the interval training option on the treadmill which has hime running some.  We will continue to monitor his progress. Ivy is doing well in rehab.  He is up to 10.1 METs on the NuStep.  We will continue to monitor his progression.         Discharge Exercise Prescription (Final Exercise Prescription Changes):     Exercise Prescription Changes - 08/05/16 1500      Exercise Review   Progression Yes     Response to Exercise   Blood Pressure (Admit) 122/64   Blood Pressure (Exercise) 126/64   Blood Pressure (Exit) 104/60   Heart Rate (Admit) 97 bpm   Heart Rate (Exercise) 135 bpm   Heart Rate (Exit) 115 bpm   Rating of Perceived Exertion (Exercise) 15   Symptoms none   Comments Home Exercise Guidelines given 07/09/16   Duration Progress to 45 minutes of aerobic exercise without signs/symptoms of physical  distress   Intensity THRR unchanged     Progression   Progression Continue to progress workloads to maintain intensity without signs/symptoms of physical distress.   Average METs 9.27     Resistance Training   Training Prescription Yes   Weight 7 lbs   Reps 10-12     Interval Training   Interval Training Yes   Equipment Treadmill;NuStep;REL-XR   Comments 45mn off 30s on and program on treadmill     Treadmill   MPH 4   Grade 4   Minutes 15   METs 8.2     NuStep   Level 9   Minutes 15   METs 10.1     REL-XR   Level 7   Minutes 15   METs 9.5     Home Exercise Plan   Plans to continue exercise at Home   Frequency Add 4 additional days to program exercise sessions.  Walks and has stationary bike at home      Nutrition:  Target Goals: Understanding of nutrition guidelines, daily intake of sodium <15062m cholesterol <20037mcalories 30% from fat and 7% or less from saturated fats, daily to have 5 or more servings of fruits and vegetables.  Biometrics:     Pre Biometrics - 06/22/16 1355      Pre Biometrics   Height 6' 0.25" (1.835 m)   Weight 209 lb 8 oz (95 kg)   Waist Circumference 40 inches   Hip Circumference 40 inches   Waist to Hip Ratio 1 %   BMI (Calculated) 28.3   Single Leg Stand 30 seconds       Nutrition Therapy Plan and Nutrition Goals:     Nutrition Therapy & Goals - 07/31/16 1130      Nutrition Therapy   Diet Instructed patient on a meal plan based on 2000 calories incorporating heart healthy dietary guidelines as well as diabetes dietary guidelines   Drug/Food Interactions Statins/Certain Fruits   Protein (specify units) 9   Fiber 30 grams   Whole Grain Foods 3 servings   Saturated Fats 13 max. grams   Fruits and Vegetables 5 servings/day   Sodium 2000 grams  1500m49meal     Personal Nutrition Goals   Personal Goal #1 Read labels for saturated fat, trans fat and sodium   Personal Goal #2 Include a snack before going to work that  includes 1-2 servings of carbohydrate and protein to  help prevent low blood sugars at work.   Personal Goal #3 Refer to list of foods and soluble fiber content with ideal goal of 10 gms per day.   Personal Goal #4 Consider a margarine with plant stanols/sterols- Refer to list     Intervention Plan   Intervention Prescribe, educate and counsel regarding individualized specific dietary modifications aiming towards targeted core components such as weight, hypertension, lipid management, diabetes, heart failure and other comorbidities.;Nutrition handout(s) given to patient.   Expected Outcomes Short Term Goal: Understand basic principles of dietary content, such as calories, fat, sodium, cholesterol and nutrients.;Long Term Goal: Adherence to prescribed nutrition plan.      Nutrition Discharge: Rate Your Plate Scores:     Nutrition Assessments - 06/22/16 1238      Rate Your Plate Scores   Pre Score --  Not done. Patient doesn't want to meet individually with the Cardiac REhab REgisterd Dietician.       Nutrition Goals Re-Evaluation:   Psychosocial: Target Goals: Acknowledge presence or absence of depression, maximize coping skills, provide positive support system. Participant is able to verbalize types and ability to use techniques and skills needed for reducing stress and depression.  Initial Review & Psychosocial Screening:     Initial Psych Review & Screening - 06/22/16 1210      Family Dynamics   Good Support System? Yes     Barriers   Psychosocial barriers to participate in program There are no identifiable barriers or psychosocial needs.      Quality of Life Scores:     Quality of Life - 06/22/16 1238      Quality of Life Scores   Health/Function Pre 16.97 %   Socioeconomic Pre 17.5 %   Psych/Spiritual Pre 17 %   Family Pre 17 %   GLOBAL Pre 17.1 %      PHQ-9: Recent Review Flowsheet Data    Depression screen [facility] 2/9 06/22/2016   Decreased Interest 0   Down,  Depressed, Hopeless 1   PHQ - 2 Score 1   Altered sleeping 1   Tired, decreased energy 1   Change in appetite 1   Feeling bad or failure about yourself  1   Trouble concentrating 1   Moving slowly or fidgety/restless 1   Suicidal thoughts 0   PHQ-9 Score 7   Difficult doing work/chores Not difficult at all      Psychosocial Evaluation and Intervention:     Psychosocial Evaluation - 07/08/16 0919      Psychosocial Evaluation & Interventions   Interventions Stress management education;Encouraged to exercise with the program and follow exercise prescription   Comments Counselor met with Mr. Canepa (Franklin Douglas) today for initial psychosocial evaluation.  He is a 45 year old who had surgery in August for (2) stents to be inserted.  He also has congestive heart failure.  Franklin Douglas has a good support system with a significant other who has been with him for the past 21 years.  He has (3) adult children who are in [place].  Franklin Douglas struggles with diabetes and neuropathy in his feet in addition to Cardiact health issues.  He has some initial problems with sleep at night occasionally but states he gets at least 6 hours most nights.  He denies a history of depression or anxiety or current symptoms.  Franklin Douglas states his mood is positive currently due to getting out and exercising more, but he is typically more melancholy.  He admits to  some stress with his workplace "hassle" over when he will return to work and finances as a result; as they are holding pay checks currently waiting on "red tape" to clear.  This is frustrating for Franklin Douglas since his Dr. has not cleared him yet to return to work.  His goals are to increase his stamina and strength to be able to return to work.  Franklin Douglas plans to continue exercising consistently by walking and using his bike at home.  He will benefit from the stress management component of this program.        Psychosocial Re-Evaluation:     Psychosocial Re-Evaluation    [place] Name  07/29/16 0950             Psychosocial Re-Evaluation   Comments Counselor follow up with Franklin Douglas today reporting sees his Cardiologist soon to determine when he can return to work.  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  He continues to have some stress with finances as he has not been paid since he has been out of work.  He plans to advocate for himself upon his return to work to have limited hours and lighter duty due to his need for better self care.  he will speak with his Dr. about this as well since his place of employment will work him 60 plus hours/week if he is willing.  Counselor commended Franklin Douglas on his improved self-care and his commitment to exercise as he is consistently exercising in this program, as well as at home.  He is noticing some increased energy and stamina as a result.            Vocational Rehabilitation: Provide vocational rehab assistance to qualifying candidates.   Vocational Rehab Evaluation & Intervention:     Vocational Rehab - 06/22/16 1205      Initial Vocational Rehab Evaluation & Intervention   Assessment shows need for Vocational Rehabilitation No      Education: Education Goals: Education classes will be provided on a weekly basis, covering required topics. Participant will state understanding/return demonstration of topics presented.  Learning Barriers/Preferences:     Learning Barriers/Preferences - 06/22/16 1204      Learning Barriers/Preferences   Learning Barriers None   Learning Preferences None      Education Topics: General Nutrition Guidelines/Fats and Fiber: -Group instruction provided by verbal, written material, models and posters to present the general guidelines for heart healthy nutrition. Gives an explanation and review of dietary fats and fiber.   Controlling Sodium/Reading Food Labels: -Group verbal and  written material supporting the discussion of sodium use in heart healthy nutrition. Review and explanation with models, verbal and written materials for utilization of the food label. Flowsheet Row Cardiac Rehab from 07/29/2016 in [facility] Cardiac and Pulmonary Rehab  Date  06/29/16  Educator  CR  Instruction Review Code  2- meets goals/outcomes      Exercise Physiology & Risk Factors: - Group verbal and written instruction with models to review the exercise physiology of the cardiovascular system and associated critical values. Details cardiovascular disease risk factors and the goals associated with each risk factor. Flowsheet Row Cardiac Rehab from 07/29/2016 in [facility] Cardiac and Pulmonary Rehab  Date  07/06/16  Educator  [facility]  Instruction Review Code  2- meets goals/outcomes      Aerobic Exercise & Resistance Training: -  Gives group verbal and written discussion on the health impact of inactivity. On the components of aerobic and resistive training programs and the benefits of this training and how to safely progress through these programs. Flowsheet Row Cardiac Rehab from 07/29/2016 in [facility] Cardiac and Pulmonary Rehab  Date  07/08/16  Educator  [facility]  Instruction Review Code  2- meets goals/outcomes      Flexibility, Balance, General Exercise Guidelines: - Provides group verbal and written instruction on the benefits of flexibility and balance training programs. Provides general exercise guidelines with specific guidelines to those with heart or lung disease. Demonstration and skill practice provided. Flowsheet Row Cardiac Rehab from 07/29/2016 in [facility] Cardiac and Pulmonary Rehab  Date  07/13/16  Educator  [facility]  Instruction Review Code  2- meets goals/outcomes      Stress Management: - Provides group verbal and written instruction about the health risks of elevated stress, cause of high stress, and healthy ways to reduce stress. Flowsheet [facility]  Cardiac and Pulmonary Rehab  Date  07/22/16  Educator  [facility]  Instruction Review Code  2- meets goals/outcomes      Depression: - Provides group verbal and written instruction on the correlation between heart/lung disease and depressed mood, treatment options, and the stigmas associated with seeking treatment.   Anatomy & Physiology of the Heart: - Group verbal and written instruction and models provide basic cardiac anatomy and physiology, with the coronary electrical and arterial systems. Review of: AMI, Angina, Valve disease, Heart Failure, Cardiac Arrhythmia, Pacemakers, and the ICD. Flowsheet Row Cardiac Rehab from 07/29/2016 in [facility] Cardiac and Pulmonary Rehab  Date  07/20/16  Educator  CE  Instruction Review Code  2- meets goals/outcomes      Cardiac Procedures: - Group verbal and written instruction and models to describe the testing methods done to diagnose heart disease. Reviews the outcomes of the test results. Describes the treatment choices: Medical Management, Angioplasty, or Coronary Bypass Surgery.   Cardiac Medications: - Group verbal and written instruction to review commonly prescribed medications for heart disease. Reviews the medication, class of the drug, and side effects. Includes the steps to properly store meds and maintain the prescription regimen. Flowsheet Row Cardiac Rehab from 07/29/2016 in [facility] Cardiac and Pulmonary Rehab  Date  07/29/16 Marisue Humble 1]  Educator  SB  Instruction Review Code  2- meets goals/outcomes      Go Sex-Intimacy & Heart Disease, Get SMART - Goal Setting: - Group verbal and written instruction through game format to discuss heart disease and the return to sexual intimacy. Provides group verbal and written material to discuss and apply goal setting through the application of the S.M.A.R.T. Method.   Other Matters of the Heart: - Provides group verbal, written materials and models to describe Heart Failure, Angina, Valve Disease, and  Diabetes in the realm of heart disease. Includes description of the disease process and treatment options available to the cardiac patient. Flowsheet Row Cardiac Rehab from 07/29/2016 in [facility] Cardiac and Pulmonary Rehab  Date  07/20/16  Educator  CE  Instruction Review Code  2- meets goals/outcomes      Exercise & Equipment Safety: - Individual verbal instruction and demonstration of equipment use and safety with use of the equipment. Flowsheet Row Cardiac Rehab from 07/29/2016 in [facility] Cardiac and Pulmonary Rehab  Date  06/22/16  Educator  C. [place]  Instruction Review Code  1- partially meets, needs review/practice      Infection Prevention: -  Provides verbal and written material to individual with discussion of infection control including proper hand washing and proper equipment cleaning during exercise session. Flowsheet Row Cardiac Rehab from 07/29/2016 in [facility] Cardiac and Pulmonary Rehab  Date  06/22/16  Educator  C. [place]kin, R.N.  Instruction Review Code  2- meets goals/outcomes      Falls Prevention: - Provides verbal and written material to individual with discussion of falls prevention and safety. Flowsheet Row Cardiac Rehab from 07/29/2016 in [facility] Cardiac and Pulmonary Rehab  Date  06/22/16  Educator  C. [place]  Instruction Review Code  2- meets goals/outcomes      Diabetes: - Individual verbal and written instruction to review signs/symptoms of diabetes, desired ranges of glucose level fasting, after meals and with exercise. Advice that pre and post exercise glucose checks will be done for 3 sessions at entry of program. [place] from 07/29/2016 in [facility] Cardiac and Pulmonary Rehab  Date  06/22/16  Educator  C. [place]  Instruction Review Code  1- partially meets, needs review/practice       Knowledge Questionnaire Score:     Knowledge Questionnaire Score - 06/22/16 1204      Knowledge Questionnaire Score   Pre Score 26/28       Core Components/Risk Factors/Patient Goals at Admission:     Personal Goals and Risk Factors at Admission - 06/22/16 1208      Core Components/Risk Factors/Patient Goals on Admission    Weight Management Yes;Weight Loss   Intervention Weight Management: Develop a combined nutrition and exercise program designed to reach desired caloric intake, while maintaining appropriate intake of nutrient and fiber, sodium and fats, and appropriate energy expenditure required for the weight goal.;Weight Management: Provide education and appropriate resources to help participant work on and attain dietary goals.   Admit Weight 209 lb 8 oz (95 kg)   Goal Weight: Short Term 204 lb (92.5 kg)   Goal Weight: Long Term 200 lb (90.7 kg)   Expected Outcomes Short Term: Continue to assess and modify interventions until short term weight is achieved;Long Term: Adherence to nutrition and physical activity/exercise program aimed toward attainment of established weight goal;Weight Loss: Understanding of general recommendations for a balanced deficit meal plan, which promotes 1-2 lb weight loss per week and includes a negative energy balance of [phone] kcal/d;Weight Maintenance: Understanding of the daily nutrition guidelines, which includes 25-35% calories from fat, 7% or less cal from saturated fats, less than 253m cholesterol, less than 1.5gm of sodium, & 5 or more servings of fruits and vegetables daily;Understanding recommendations for meals to include 15-35% energy as protein, 25-35% energy from fat, 35-60% energy from carbohydrates, less than 2041mof dietary cholesterol, 20-35 gm of total fiber daily;Understanding of distribution of calorie intake throughout the day with the consumption of 4-5 meals/snacks   Increase Strength and Stamina Yes   Intervention Provide advice, education, support and counseling about physical activity/exercise needs.;Develop an individualized exercise prescription for aerobic and  resistive training based on initial evaluation findings, risk stratification, comorbidities and participant's personal goals.   Expected Outcomes Achievement of increased cardiorespiratory fitness and enhanced flexibility, muscular endurance and strength shown through measurements of functional capacity and personal statement of participant.   Diabetes Yes   Intervention Provide education about signs/symptoms and action to take for hypo/hyperglycemia.;Provide education about proper nutrition, including hydration, and aerobic/resistive exercise prescription along with prescribed medications to achieve blood glucose in normal ranges: Fasting glucose 65-99 mg/dL  DeVicente Malesaid his last HBA1C  was 8. He said he "bottoms out when he takes his insulin sliding scale as prescribed.    Expected Outcomes Short Term: Participant verbalizes understanding of the signs/symptoms and immediate care of hyper/hypoglycemia, proper foot care and importance of medication, aerobic/resistive exercise and nutrition plan for blood glucose control.;Long Term: Attainment of HbA1C < 7%.   Heart Failure Yes   Intervention Provide a combined exercise and nutrition program that is supplemented with education, support and counseling about heart failure. Directed toward relieving symptoms such as shortness of breath, decreased exercise tolerance, and extremity edema.   Expected Outcomes Improve functional capacity of life;Short term: Attendance in program 2-3 days a week with increased exercise capacity. Reported lower sodium intake. Reported increased fruit and vegetable intake. Reports medication compliance.;Short term: Daily weights obtained and reported for increase. Utilizing diuretic protocols set by physician.;Long term: Adoption of self-care skills and reduction of barriers for early signs and symptoms recognition and intervention leading to self-care maintenance.   Hypertension Yes   Intervention Provide education on lifestyle  modifcations including regular physical activity/exercise, weight management, moderate sodium restriction and increased consumption of fresh fruit, vegetables, and low fat dairy, alcohol moderation, and smoking cessation.;Monitor prescription use compliance.   Expected Outcomes Short Term: Continued assessment and intervention until BP is < 140/45m HG in hypertensive participants. < 130/871mHG in hypertensive participants with diabetes, heart failure or chronic kidney disease.;Long Term: Maintenance of blood pressure at goal levels.   Lipids Yes   Intervention Provide education and support for participant on nutrition & aerobic/resistive exercise along with prescribed medications to achieve LDL <7063mHDL >47m66m Expected Outcomes Short Term: Participant states understanding of desired cholesterol values and is compliant with medications prescribed. Participant is following exercise prescription and nutrition guidelines.;Long Term: Cholesterol controlled with medications as prescribed, with individualized exercise RX and with personalized nutrition plan. Value goals: LDL < 70mg31mL > 40 mg.      Core Components/Risk Factors/Patient Goals Review:      Goals and Risk Factor Review    Row Name 07/06/16 1023 07/22/16 0843           Core Components/Risk Factors/Patient Goals Review   Personal Goals Review Weight Management/Obesity;Sedentary;Increase Strength and Stamina;Diabetes;Lipids;Hypertension;Heart Failure Weight Management/Obesity;Sedentary;Increase Strength and Stamina;Diabetes;Heart Failure;Lipids;Hypertension      Review Franklin Douglas to a good start with rehab.  He is already riding his recumbent bike and walking at home as well.  His blood sugars have been good and staying between 150-180.  He is not checking his blood pressure at home, but they have been good when here.  He is weighing daily, but was up some today.  He was surprised at how much salt is in diet soda and is trying to cut  back.  He has not had any problems with his statin medicaiton. Franklin Douglas well with rehab.  He is exercising regularly at home and has started hiking more too.  Despite his increase in exercise his weight has remained steady.  He would like to lose more weight.  He is struggling with reducing the sodium in his diet.  He now has an appointment with the nutritionist next Friday.  He is trying to read labels but hopes meeting the dietician will help.  He is doing well with his blood sugars and blood pressures.  He has not had any problems with his statins.        Expected Outcomes Franklin Douglas continue to come to exercise and education  classes for risk factor modification. Franklin Douglas will continue to come to exercise and education.  He will also meet with dietician to work on modifications for weight loss.         Core Components/Risk Factors/Patient Goals at Discharge (Final Review):      Goals and Risk Factor Review - 07/22/16 0843      Core Components/Risk Factors/Patient Goals Review   Personal Goals Review Weight Management/Obesity;Sedentary;Increase Strength and Stamina;Diabetes;Heart Failure;Lipids;Hypertension   Review Franklin Douglas is doing well with rehab.  He is exercising regularly at home and has started hiking more too.  Despite his increase in exercise his weight has remained steady.  He would like to lose more weight.  He is struggling with reducing the sodium in his diet.  He now has an appointment with the nutritionist next Friday.  He is trying to read labels but hopes meeting the dietician will help.  He is doing well with his blood sugars and blood pressures.  He has not had any problems with his statins.     Expected Outcomes Franklin Douglas will continue to come to exercise and education.  He will also meet with dietician to work on modifications for weight loss.      ITP Comments:     ITP Comments    Row Name 06/22/16 1208 06/22/16 1210 07/15/16 1122 08/12/16 0601     ITP Comments Franklin Douglas said  he has already met with a dietician for his diabetes. He said he does not feel like he needs to meet with the Cardiac REhab REgistered Dietician. Franklin Douglas said his last HBA1C was 8. He said he "bottoms out when he takes his insulin sliding scale as prescribed. 30 day review. Continue with ITP unless changes noted by Medical Director at signature of review. 30 day review completed for Medical Director physician review and signature. Continue ITP unless changes made by physician.       Comments:

## 2016-08-13 DIAGNOSIS — I25119 Atherosclerotic heart disease of native coronary artery with unspecified angina pectoris: Secondary | ICD-10-CM | POA: Insufficient documentation

## 2016-08-14 ENCOUNTER — Telehealth: Payer: Self-pay | Admitting: *Deleted

## 2016-08-14 ENCOUNTER — Encounter: Payer: Self-pay | Admitting: *Deleted

## 2016-08-14 DIAGNOSIS — Z955 Presence of coronary angioplasty implant and graft: Secondary | ICD-10-CM

## 2016-08-14 NOTE — Telephone Encounter (Signed)
Called to check on status of return.  Out for over a week.  Left message on machine at home

## 2016-09-01 ENCOUNTER — Encounter: Payer: Self-pay | Admitting: *Deleted

## 2016-09-01 ENCOUNTER — Telehealth: Payer: Self-pay | Admitting: *Deleted

## 2016-09-01 DIAGNOSIS — Z955 Presence of coronary angioplasty implant and graft: Secondary | ICD-10-CM

## 2016-09-01 NOTE — Progress Notes (Signed)
Discharge Summary  Patient Details  Name: Franklin Douglas C Macke MRN: [id number] Date of Birth: 11/19/1970 Referring Provider:   Flowsheet [facility]  Referring Provider  Eppie Gibsonossi, Joseph MD       Number of Visits: 24  Reason for Discharge:  Patient reached a stable level of exercise. Patient independent in their exercise. Early Exit:  Back to work  Smoking History:  History  Smoking Status  . Never Smoker  Smokeless Tobacco  . Never Used    Diagnosis:  S/P coronary artery stent placement  ADL [facility]:   Initial Exercise Prescription:     Initial Exercise Prescription - 06/22/16 1300      Date of Initial Exercise RX and Referring Provider   Date 06/22/16   Referring Provider Eppie Gibsonossi, Joseph MD     Treadmill   MPH 2.5   Grade 2   Minutes 15   METs 3.6     NuStep   Level 3   Minutes 15   METs 2     REL-XR   Level 3   Minutes 15   METs 2     Prescription Details   Frequency (times per week) 3   Duration Progress to 45 minutes of aerobic exercise without signs/symptoms of physical distress     Intensity   THRR 40-80% of Max Heartrate 128-159   Ratings of Perceived Exertion 11-15   Perceived Dyspnea 0-4     Progression   Progression Continue to progress workloads to maintain intensity without signs/symptoms of physical distress.     Resistance Training   Training Prescription Yes   Weight 3 lbs   Reps 10-12      Discharge Exercise Prescription (Final Exercise Prescription Changes):     Exercise Prescription Changes - 08/05/16 1500      Exercise Review   Progression Yes     Response to Exercise   Blood Pressure (Admit) 122/64   Blood Pressure (Exercise) 126/64   Blood Pressure (Exit) 104/60   Heart Rate (Admit) 97 bpm   Heart Rate (Exercise) 135 bpm   Heart Rate (Exit) 115 bpm   Rating of Perceived Exertion (Exercise) 15   Symptoms none   Comments Home Exercise Guidelines given 07/09/16   Duration Progress to 45 minutes of aerobic exercise without signs/symptoms of physical distress   Intensity THRR unchanged     Progression   Progression Continue to progress workloads to maintain intensity without signs/symptoms of physical distress.   Average METs 9.27     Resistance Training   Training Prescription Yes   Weight 7 lbs   Reps 10-12     Interval Training   Interval Training Yes   Equipment Treadmill;NuStep;REL-XR   Comments 2min off 30s on and program on treadmill     Treadmill   MPH 4   Grade 4   Minutes 15   METs 8.2     NuStep   Level 9   Minutes 15   METs 10.1     REL-XR   Level 7   Minutes 15   METs 9.5     Home Exercise Plan   Plans to continue exercise at Home   Frequency Add 4 additional days to program exercise sessions.  Walks and has stationary bike at home      Functional Capacity:     6 Minute Walk    Row Name 06/22/16 1351         6  Minute Walk   Phase Initial     Distance 1300 feet     Walk Time 6 minutes     # of Rest Breaks 0     MPH 2.46     METS 4.42     RPE 9     VO2 Peak 15.47     Symptoms No     Resting HR 87 bpm     Resting BP 120/60     Max Ex. HR 114 bpm     Max Ex. BP 136/64     2 Minute Post BP 126/64        Psychological, QOL, Others - Outcomes: PHQ 2/9: Depression screen PHQ 2/9 06/22/2016  Decreased Interest 0  Down, Depressed, Hopeless 1  PHQ - 2 Score 1  Altered sleeping 1  Tired, decreased energy 1  Change in appetite 1  Feeling bad or failure about yourself  1  Trouble concentrating 1  Moving slowly or fidgety/restless 1  Suicidal thoughts 0  PHQ-9 Score 7  Difficult doing work/chores Not difficult at all    Quality of Life:     Quality of Life - 06/22/16 1238      Quality of Life Scores   Health/Function Pre 16.97 %   Socioeconomic Pre 17.5 %   Psych/Spiritual Pre 17 %   Family Pre 17 %   GLOBAL Pre 17.1 %      Personal Goals: Goals established at orientation with  interventions provided to work toward goal.     Personal Goals and Risk Factors at Admission - 06/22/16 1208      Core Components/Risk Factors/Patient Goals on Admission    Weight Management Yes;Weight Loss   Intervention Weight Management: Develop a combined nutrition and exercise program designed to reach desired caloric intake, while maintaining appropriate intake of nutrient and fiber, sodium and fats, and appropriate energy expenditure required for the weight goal.;Weight Management: Provide education and appropriate resources to help participant work on and attain dietary goals.   Admit Weight 209 lb 8 oz (95 kg)   Goal Weight: Short Term 204 lb (92.5 kg)   Goal Weight: Long Term 200 lb (90.7 kg)   Expected Outcomes Short Term: Continue to assess and modify interventions until short term weight is achieved;Long Term: Adherence to nutrition and physical activity/exercise program aimed toward attainment of established weight goal;Weight Loss: Understanding of general recommendations for a balanced deficit meal plan, which promotes 1-2 lb weight loss per week and includes a negative energy balance of [phone] kcal/d;Weight Maintenance: Understanding of the daily nutrition guidelines, which includes 25-35% calories from fat, 7% or less cal from saturated fats, less than 200mg  cholesterol, less than 1.5gm of sodium, & 5 or more servings of fruits and vegetables daily;Understanding recommendations for meals to include 15-35% energy as protein, 25-35% energy from fat, 35-60% energy from carbohydrates, less than 200mg  of dietary cholesterol, 20-35 gm of total fiber daily;Understanding of distribution of calorie intake throughout the day with the consumption of 4-5 meals/snacks   Increase Strength and Stamina Yes   Intervention Provide advice, education, support and counseling about physical activity/exercise needs.;Develop an individualized exercise prescription for aerobic and resistive training based on  initial evaluation findings, risk stratification, comorbidities and participant's personal goals.   Expected Outcomes Achievement of increased cardiorespiratory fitness and enhanced flexibility, muscular endurance and strength shown through measurements of functional capacity and personal statement of participant.   Diabetes Yes   Intervention Provide education about signs/symptoms and action to  take for hypo/hyperglycemia.;Provide education about proper nutrition, including hydration, and aerobic/resistive exercise prescription along with prescribed medications to achieve blood glucose in normal ranges: Fasting glucose 65-99 mg/dL  Francee PiccoloDerek said his last ZOX0RHBA1C was 8. He said he "bottoms out when he takes his insulin sliding scale as prescribed.    Expected Outcomes Short Term: Participant verbalizes understanding of the signs/symptoms and immediate care of hyper/hypoglycemia, proper foot care and importance of medication, aerobic/resistive exercise and nutrition plan for blood glucose control.;Long Term: Attainment of HbA1C < 7%.   Heart Failure Yes   Intervention Provide a combined exercise and nutrition program that is supplemented with education, support and counseling about heart failure. Directed toward relieving symptoms such as shortness of breath, decreased exercise tolerance, and extremity edema.   Expected Outcomes Improve functional capacity of life;Short term: Attendance in program 2-3 days a week with increased exercise capacity. Reported lower sodium intake. Reported increased fruit and vegetable intake. Reports medication compliance.;Short term: Daily weights obtained and reported for increase. Utilizing diuretic protocols set by physician.;Long term: Adoption of self-care skills and reduction of barriers for early signs and symptoms recognition and intervention leading to self-care maintenance.   Hypertension Yes   Intervention Provide education on lifestyle modifcations including regular  physical activity/exercise, weight management, moderate sodium restriction and increased consumption of fresh fruit, vegetables, and low fat dairy, alcohol moderation, and smoking cessation.;Monitor prescription use compliance.   Expected Outcomes Short Term: Continued assessment and intervention until BP is < 140/6790mm HG in hypertensive participants. < 130/380mm HG in hypertensive participants with diabetes, heart failure or chronic kidney disease.;Long Term: Maintenance of blood pressure at goal levels.   Lipids Yes   Intervention Provide education and support for participant on nutrition & aerobic/resistive exercise along with prescribed medications to achieve LDL 70mg , HDL >40mg .   Expected Outcomes Short Term: Participant states understanding of desired cholesterol values and is compliant with medications prescribed. Participant is following exercise prescription and nutrition guidelines.;Long Term: Cholesterol controlled with medications as prescribed, with individualized exercise RX and with personalized nutrition plan. Value goals: LDL < 70mg , HDL > 40 mg.       Personal Goals Discharge:     Goals and Risk Factor Review    Row Name 07/06/16 1023 07/22/16 0843           Core Components/Risk Factors/Patient Goals Review   Personal Goals Review Weight Management/Obesity;Sedentary;Increase Strength and Stamina;Diabetes;Lipids;Hypertension;Heart Failure Weight Management/Obesity;Sedentary;Increase Strength and Stamina;Diabetes;Heart Failure;Lipids;Hypertension      Review Francee PiccoloDerek is off to a good start with rehab.  He is already riding his recumbent bike and walking at home as well.  His blood sugars have been good and staying between 150-180.  He is not checking his blood pressure at home, but they have been good when here.  He is weighing daily, but was up some today.  He was surprised at how much salt is in diet soda and is trying to cut back.  He has not had any problems with his statin  medicaiton. Francee PiccoloDerek is doing well with rehab.  He is exercising regularly at home and has started hiking more too.  Despite his increase in exercise his weight has remained steady.  He would like to lose more weight.  He is struggling with reducing the sodium in his diet.  He now has an appointment with the nutritionist next Friday.  He is trying to read labels but hopes meeting the dietician will help.  He is doing well with his blood  sugars and blood pressures.  He has not had any problems with his statins.        Expected Outcomes Mardell will continue to come to exercise and education classes for risk factor modification. Kerron will continue to come to exercise and education.  He will also meet with dietician to work on modifications for weight loss.         Nutrition & Weight - Outcomes:     Pre Biometrics - 06/22/16 1355      Pre Biometrics   Height 6' 0.25" (1.835 m)   Weight 209 lb 8 oz (95 kg)   Waist Circumference 40 inches   Hip Circumference 40 inches   Waist to Hip Ratio 1 %   BMI (Calculated) 28.3   Single Leg Stand 30 seconds       Nutrition:     Nutrition Therapy & Goals - 07/31/16 1130      Nutrition Therapy   Diet Instructed patient on a meal plan based on 2000 calories incorporating heart healthy dietary guidelines as well as diabetes dietary guidelines   Drug/Food Interactions Statins/Certain Fruits   Protein (specify units) 9   Fiber 30 grams   Whole Grain Foods 3 servings   Saturated Fats 13 max. grams   Fruits and Vegetables 5 servings/day   Sodium 2000 grams  1500mg  ideal     Personal Nutrition Goals   Personal Goal #1 Read labels for saturated fat, trans fat and sodium   Personal Goal #2 Include a snack before going to work that includes 1-2 servings of carbohydrate and protein to help prevent low blood sugars at work.   Personal Goal #3 Refer to list of foods and soluble fiber content with ideal goal of 10 gms per day.   Personal Goal #4 Consider a  margarine with plant stanols/sterols- Refer to list     Intervention Plan   Intervention Prescribe, educate and counsel regarding individualized specific dietary modifications aiming towards targeted core components such as weight, hypertension, lipid management, diabetes, heart failure and other comorbidities.;Nutrition handout(s) given to patient.   Expected Outcomes Short Term Goal: Understand basic principles of dietary content, such as calories, fat, sodium, cholesterol and nutrients.;Long Term Goal: Adherence to prescribed nutrition plan.      Nutrition Discharge:     Nutrition Assessments - 06/22/16 1238      Rate Your Plate Scores   Pre Score --  Not done. Patient doesn't want to meet individually with the Cardiac REhab REgisterd Dietician.       Education Questionnaire Score:     Knowledge Questionnaire Score - 06/22/16 1204      Knowledge Questionnaire Score   Pre Score 26/28      Goals reviewed with patient; copy given to patient.

## 2016-09-01 NOTE — Progress Notes (Signed)
Cardiac Individual Treatment Plan  Patient Details  Name: Franklin Douglas MRN: [id number] Date of Birth: [birth year] Referring Provider:   Flowsheet Row Cardiac Rehab from 06/22/2016 in [facility] Cardiac and Pulmonary Rehab  Referring Provider  Kerry Dory MD      Initial Encounter Date:  Flowsheet Row Cardiac Rehab from 06/22/2016 in [facility] Cardiac and Pulmonary Rehab  Date  06/22/16  Referring Provider  Kerry Dory MD      Visit Diagnosis: S/P coronary artery stent placement  Patient's Home Medications on Admission:  Current Outpatient Prescriptions:  .  albuterol (PROVENTIL HFA;VENTOLIN HFA) 108 (90 BASE) MCG/ACT inhaler, Inhale 1-2 puffs into the lungs every 6 (six) hours as needed for wheezing or shortness of breath. (Patient not taking: Reported on 06/22/2016), Disp: 1 Inhaler, Rfl: 0 .  aspirin EC 81 MG tablet, Take 81 mg by mouth., Disp: , Rfl:  .  atorvastatin (LIPITOR) 80 MG tablet, Take 80 mg by mouth., Disp: , Rfl:  .  benzonatate (TESSALON) 200 MG capsule, Take 1 capsule (200 mg total) by mouth 3 (three) times daily as needed for cough. (Patient not taking: Reported on 06/22/2016), Disp: 30 capsule, Rfl: 0 .  carvedilol (COREG) 12.5 MG tablet, Take 12.5 mg by mouth., Disp: , Rfl:  .  chlordiazePOXIDE (LIBRIUM) 25 MG capsule, 35m PO TID x 1D, then 25-582mPO BID X 1D, then 25-5080mO QD X 1D (Patient not taking: Reported on 06/22/2016), Disp: 10 capsule, Rfl: 0 .  clopidogrel (PLAVIX) 75 MG tablet, Take 75 mg by mouth., Disp: , Rfl:  .  furosemide (LASIX) 20 MG tablet, TAKE ONE TABLET BY MOUTH TWICE DAILY, Disp: , Rfl:  .  HYDROcodone-acetaminophen (NORCO/VICODIN) 5-325 MG tablet, Take 1-2 tablets by mouth every 6 (six) hours as needed for severe pain. (Patient not taking: Reported on 06/22/2016), Disp: 20 tablet, Rfl: 0 .  insulin detemir (LEVEMIR) 100 UNIT/ML injection, Inject 5 Units into the skin at bedtime. , Disp: , Rfl:  .  lisinopril (PRINIVIL,ZESTRIL) 5 MG tablet, TAKE ONE  TABLET BY MOUTH ONCE DAILY, Disp: , Rfl:  .  spironolactone (ALDACTONE) 25 MG tablet, Take 12.5 mg by mouth., Disp: , Rfl:  .  triamcinolone cream (KENALOG) 0.1 %, Apply 1 application topically 2 (two) times daily., Disp: 30 g, Rfl: 0  Past Medical History: Past Medical History:  Diagnosis Date  . Diabetes mellitus without complication (HCCIsle . Diabetic neuropathy (HCCPetronila . Diverticula, colon   . Diverticulitis   . Sciatica     Tobacco Use: History  Smoking Status  . Never Smoker  Smokeless Tobacco  . Never Used    Labs: Recent Review Flowsheet Data    Labs for ITP Cardiac and Pulmonary Rehab Latest Ref Rng & Units 02/20/2015   Cholestrol 0 - 200 mg/dL 256(A)   HDL 35 - 70 mg/dL 41   Trlycerides 40 - 160 mg/dL 463(A)   Hemoglobin A1c - 6.8       Exercise Target Goals:    Exercise Program Goal: Individual exercise prescription set with THRR, safety & activity barriers. Participant demonstrates ability to understand and report RPE using BORG scale, to self-measure pulse accurately, and to acknowledge the importance of the exercise prescription.  Exercise Prescription Goal: Starting with aerobic activity 30 plus minutes a day, 3 days per week for initial exercise prescription. Provide home exercise prescription and guidelines that participant acknowledges understanding prior to discharge.  Activity Barriers & Risk Stratification:     Activity  Barriers & Cardiac Risk Stratification - 06/22/16 1204      Activity Barriers & Cardiac Risk Stratification   Activity Barriers Balance Concerns  neuropathy in both feet   Cardiac Risk Stratification High      6 Minute Walk:     6 Minute Walk    Row Name 06/22/16 1351         6 Minute Walk   Phase Initial     Distance 1300 feet     Walk Time 6 minutes     # of Rest Breaks 0     MPH 2.46     METS 4.42     RPE 9     VO2 Peak 15.47     Symptoms No     Resting HR 87 bpm     Resting BP 120/60     Max Ex. HR 114  bpm     Max Ex. BP 136/64     2 Minute Post BP 126/64        Initial Exercise Prescription:     Initial Exercise Prescription - 06/22/16 1300      Date of Initial Exercise RX and Referring Provider   Date 06/22/16   Referring Provider Kerry Dory MD     Treadmill   MPH 2.5   Grade 2   Minutes 15   METs 3.6     NuStep   Level 3   Minutes 15   METs 2     REL-XR   Level 3   Minutes 15   METs 2     Prescription Details   Frequency (times per week) 3   Duration Progress to 45 minutes of aerobic exercise without signs/symptoms of physical distress     Intensity   THRR 40-80% of Max Heartrate 128-159   Ratings of Perceived Exertion 11-15   Perceived Dyspnea 0-4     Progression   Progression Continue to progress workloads to maintain intensity without signs/symptoms of physical distress.     Resistance Training   Training Prescription Yes   Weight 3 lbs   Reps 10-12      Perform Capillary Blood Glucose checks as needed.  Exercise Prescription Changes:     Exercise Prescription Changes    Row Name 06/22/16 1300 07/08/16 1400 07/09/16 1000 07/22/16 1400 08/05/16 1500     Exercise Review   Progression -  walk test results Yes  - Yes Yes     Response to Exercise   Blood Pressure (Admit) 120/60 142/72  - 126/64 122/64   Blood Pressure (Exercise) 136/64 130/64  - 146/76 126/64   Blood Pressure (Exit) 126/64 126/60  - 112/60 104/60   Heart Rate (Admit) 98 bpm 80 bpm  - 83 bpm 97 bpm   Heart Rate (Exercise) 114 bpm 133 bpm  - 138 bpm 135 bpm   Heart Rate (Exit) 94 bpm 102 bpm  - 95 bpm 115 bpm   Rating of Perceived Exertion (Exercise) 98 18  - 16 15   Perceived Dyspnea (Exercise) 9  -  -  -  -   Symptoms none none  - none none   Comments  -  -  - Home Exercise Guidelines given 07/09/16 Home Exercise Guidelines given 07/09/16   Duration  - Progress to 45 minutes of aerobic exercise without signs/symptoms of physical distress  - Progress to 45 minutes of aerobic  exercise without signs/symptoms of physical distress Progress to 45 minutes of aerobic exercise without signs/symptoms  of physical distress   Intensity  - THRR unchanged  - THRR unchanged THRR unchanged     Progression   Progression  - Continue to progress workloads to maintain intensity without signs/symptoms of physical distress.  - Continue to progress workloads to maintain intensity without signs/symptoms of physical distress. Continue to progress workloads to maintain intensity without signs/symptoms of physical distress.   Average METs  - 7.26  - 8.3 9.27     Resistance Training   Training Prescription  - Yes  - Yes Yes   Weight  - 5 lbs  - 7 lbs 7 lbs   Reps  - 10-12  - 10-12 10-12     Interval Training   Interval Training  - Yes  - Yes Yes   Equipment  - NuStep  - Treadmill;NuStep;REL-XR Treadmill;NuStep;REL-XR   Comments  - 450mn off 30s on (level 7-level 9)  - 296m off 30s on and program on treadmill 50m350moff 30s on and program on treadmill     Treadmill   MPH  - 3.5  - 4  running 4   Grade  - 4  - 4 4   Minutes  - 15  - 15 15   METs  - 5.61  - 8.2 8.2     NuStep   Level  - 9  - 9 9   Minutes  - 15  - 15 15   METs  - 8.9  - 9.2 10.1     REL-XR   Level  - 7  - 7 7   Minutes  - 15  - 15 15   METs  - 4  - 7.5 9.5     Home Exercise Plan   Plans to continue exercise [place]  -  [place]  -  - Add 4 additional days to program exercise sessions.  Walks and has stationary bike at home Add 4 additional days to program exercise sessions.  Walks and has stationary bike at home Add 4 additional days to program exercise sessions.  Walks and has stationary bike at home      Exercise Comments:     Exercise Comments    Row Name 06/22/16 1354 06/29/16 0901 07/08/16 0957 07/09/16 1018 07/17/16 1012   Exercise Comments Franklin Douglas to get his strength and stamina back up in order to return to work. First full day of exercise!  Patient was oriented to gym and  equipment including functions, settings, policies, and procedures.  Patient's individual exercise prescription and treatment plan were reviewed.  All starting workloads were established based on the results of the 6 minute walk test done at initial orientation visit.  The plan for exercise progression was also introduced and progression will be customized based on patient's performance and goals. Franklin Douglas off to a good start with exercise.  He started HIIT on NuStep today and felt that he had a good workout!  We will continue to monitor his progress. Reviewed home exercise with pt today.  Pt plans to walk and use stationary bike for exercise.  Reviewed THR, pulse, RPE, sign and symptoms, NTG use, and when to call 911 or MD.  Also discussed weather considerations and indoor options.  Pt voiced understanding. Reviewed METs average and discussed progression with pt today.   Row Name 07/22/16 1453 08/05/16 1550         Exercise Comments Franklin Douglas to do well with exercise. He is hiking some  and riding his bike daily at home.  He is enjoying the challenge of the HIIT.  He has even started using the interval training option on the treadmill which has hime running some.  We will continue to monitor his progress. Franklin Douglas is doing well in rehab.  He is up to 10.1 METs on the NuStep.  We will continue to monitor his progression.         Discharge Exercise Prescription (Final Exercise Prescription Changes):     Exercise Prescription Changes - 08/05/16 1500      Exercise Review   Progression Yes     Response to Exercise   Blood Pressure (Admit) 122/64   Blood Pressure (Exercise) 126/64   Blood Pressure (Exit) 104/60   Heart Rate (Admit) 97 bpm   Heart Rate (Exercise) 135 bpm   Heart Rate (Exit) 115 bpm   Rating of Perceived Exertion (Exercise) 15   Symptoms none   Comments Home Exercise Guidelines given 07/09/16   Duration Progress to 45 minutes of aerobic exercise without signs/symptoms of physical  distress   Intensity THRR unchanged     Progression   Progression Continue to progress workloads to maintain intensity without signs/symptoms of physical distress.   Average METs 9.27     Resistance Training   Training Prescription Yes   Weight 7 lbs   Reps 10-12     Interval Training   Interval Training Yes   Equipment Treadmill;NuStep;REL-XR   Comments 45mn off 30s on and program on treadmill     Treadmill   MPH 4   Grade 4   Minutes 15   METs 8.2     NuStep   Level 9   Minutes 15   METs 10.1     REL-XR   Level 7   Minutes 15   METs 9.5     Home Exercise Plan   Plans to continue exercise at Home   Frequency Add 4 additional days to program exercise sessions.  Walks and has stationary bike at home      Nutrition:  Target Goals: Understanding of nutrition guidelines, daily intake of sodium <15062m cholesterol <20037mcalories 30% from fat and 7% or less from saturated fats, daily to have 5 or more servings of fruits and vegetables.  Biometrics:     Pre Biometrics - 06/22/16 1355      Pre Biometrics   Height 6' 0.25" (1.835 m)   Weight 209 lb 8 oz (95 kg)   Waist Circumference 40 inches   Hip Circumference 40 inches   Waist to Hip Ratio 1 %   BMI (Calculated) 28.3   Single Leg Stand 30 seconds       Nutrition Therapy Plan and Nutrition Goals:     Nutrition Therapy & Goals - 07/31/16 1130      Nutrition Therapy   Diet Instructed patient on a meal plan based on 2000 calories incorporating heart healthy dietary guidelines as well as diabetes dietary guidelines   Drug/Food Interactions Statins/Certain Fruits   Protein (specify units) 9   Fiber 30 grams   Whole Grain Foods 3 servings   Saturated Fats 13 max. grams   Fruits and Vegetables 5 servings/day   Sodium 2000 grams  1500m49meal     Personal Nutrition Goals   Personal Goal #1 Read labels for saturated fat, trans fat and sodium   Personal Goal #2 Include a snack before going to work that  includes 1-2 servings of carbohydrate and protein to  help prevent low blood sugars at work.   Personal Goal #3 Refer to list of foods and soluble fiber content with ideal goal of 10 gms per day.   Personal Goal #4 Consider a margarine with plant stanols/sterols- Refer to list     Intervention Plan   Intervention Prescribe, educate and counsel regarding individualized specific dietary modifications aiming towards targeted core components such as weight, hypertension, lipid management, diabetes, heart failure and other comorbidities.;Nutrition handout(s) given to patient.   Expected Outcomes Short Term Goal: Understand basic principles of dietary content, such as calories, fat, sodium, cholesterol and nutrients.;Long Term Goal: Adherence to prescribed nutrition plan.      Nutrition Discharge: Rate Your Plate Scores:     Nutrition Assessments - 06/22/16 1238      Rate Your Plate Scores   Pre Score --  Not done. Patient doesn't want to meet individually with the Cardiac REhab REgisterd Dietician.       Nutrition Goals Re-Evaluation:   Psychosocial: Target Goals: Acknowledge presence or absence of depression, maximize coping skills, provide positive support system. Participant is able to verbalize types and ability to use techniques and skills needed for reducing stress and depression.  Initial Review & Psychosocial Screening:     Initial Psych Review & Screening - 06/22/16 1210      Family Dynamics   Good Support System? Yes     Barriers   Psychosocial barriers to participate in program There are no identifiable barriers or psychosocial needs.      Quality of Life Scores:     Quality of Life - 06/22/16 1238      Quality of Life Scores   Health/Function Pre 16.97 %   Socioeconomic Pre 17.5 %   Psych/Spiritual Pre 17 %   Family Pre 17 %   GLOBAL Pre 17.1 %      PHQ-9: Recent Review Flowsheet Data    Depression screen [facility] 2/9 06/22/2016   Decreased Interest 0   Down,  Depressed, Hopeless 1   PHQ - 2 Score 1   Altered sleeping 1   Tired, decreased energy 1   Change in appetite 1   Feeling bad or failure about yourself  1   Trouble concentrating 1   Moving slowly or fidgety/restless 1   Suicidal thoughts 0   PHQ-9 Score 7   Difficult doing work/chores Not difficult at all      Psychosocial Evaluation and Intervention:     Psychosocial Evaluation - 07/08/16 0919      Psychosocial Evaluation & Interventions   Interventions Stress management education;Encouraged to exercise with the program and follow exercise prescription   Comments Counselor met with Franklin Douglas (Franklin Douglas) today for initial psychosocial evaluation.  He is a 45 year old who had surgery in August for (2) stents to be inserted.  He also has congestive heart failure.  Franklin Douglas has a good support system with a significant other who has been with him for the past 21 years.  He has (3) adult children who are in [place].  Franklin Douglas struggles with diabetes and neuropathy in his feet in addition to Cardiact health issues.  He has some initial problems with sleep at night occasionally but states he gets at least 6 hours most nights.  He denies a history of depression or anxiety or current symptoms.  Franklin Douglas states his mood is positive currently due to getting out and exercising more, but he is typically more melancholy.  He admits to  some stress with his workplace "hassle" over when he will return to work and finances as a result; as they are holding pay checks currently waiting on "red tape" to clear.  This is frustrating for Franklin Douglas since his Dr. has not cleared him yet to return to work.  His goals are to increase his stamina and strength to be able to return to work.  Franklin Douglas plans to continue exercising consistently by walking and using his bike at home.  He will benefit from the stress management component of this program.        Psychosocial Re-Evaluation:     Psychosocial Re-Evaluation    [place] Name  07/29/16 0950             Psychosocial Re-Evaluation   Comments Counselor follow up with Franklin Douglas today reporting sees his Cardiologist soon to determine when he can return to work.  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  He continues to have some stress with finances as he has not been paid since he has been out of work.  He plans to advocate for himself upon his return to work to have limited hours and lighter duty due to his need for better self care.  he will speak with his Dr. about this as well since his place of employment will work him 60 plus hours/week if he is willing.  Counselor commended Franklin Douglas on his improved self-care and his commitment to exercise as he is consistently exercising in this program, as well as at home.  He is noticing some increased energy and stamina as a result.            Vocational Rehabilitation: Provide vocational rehab assistance to qualifying candidates.   Vocational Rehab Evaluation & Intervention:     Vocational Rehab - 06/22/16 1205      Initial Vocational Rehab Evaluation & Intervention   Assessment shows need for Vocational Rehabilitation No      Education: Education Goals: Education classes will be provided on a weekly basis, covering required topics. Participant will state understanding/return demonstration of topics presented.  Learning Barriers/Preferences:     Learning Barriers/Preferences - 06/22/16 1204      Learning Barriers/Preferences   Learning Barriers None   Learning Preferences None      Education Topics: General Nutrition Guidelines/Fats and Fiber: -Group instruction provided by verbal, written material, models and posters to present the general guidelines for heart healthy nutrition. Gives an explanation and review of dietary fats and fiber.   Controlling Sodium/Reading Food Labels: -Group verbal and  written material supporting the discussion of sodium use in heart healthy nutrition. Review and explanation with models, verbal and written materials for utilization of the food label. Flowsheet Row Cardiac Rehab from 07/29/2016 in [facility] Cardiac and Pulmonary Rehab  Date  06/29/16  Educator  CR  Instruction Review Code  2- meets goals/outcomes      Exercise Physiology & Risk Factors: - Group verbal and written instruction with models to review the exercise physiology of the cardiovascular system and associated critical values. Details cardiovascular disease risk factors and the goals associated with each risk factor. Flowsheet Row Cardiac Rehab from 07/29/2016 in [facility] Cardiac and Pulmonary Rehab  Date  07/06/16  Educator  [facility]  Instruction Review Code  2- meets goals/outcomes      Aerobic Exercise & Resistance Training: -  Gives group verbal and written discussion on the health impact of inactivity. On the components of aerobic and resistive training programs and the benefits of this training and how to safely progress through these programs. Flowsheet Row Cardiac Rehab from 07/29/2016 in [facility] Cardiac and Pulmonary Rehab  Date  07/08/16  Educator  [facility]  Instruction Review Code  2- meets goals/outcomes      Flexibility, Balance, General Exercise Guidelines: - Provides group verbal and written instruction on the benefits of flexibility and balance training programs. Provides general exercise guidelines with specific guidelines to those with heart or lung disease. Demonstration and skill practice provided. Flowsheet Row Cardiac Rehab from 07/29/2016 in [facility] Cardiac and Pulmonary Rehab  Date  07/13/16  Educator  [facility]  Instruction Review Code  2- meets goals/outcomes      Stress Management: - Provides group verbal and written instruction about the health risks of elevated stress, cause of high stress, and healthy ways to reduce stress. Flowsheet [facility]  Cardiac and Pulmonary Rehab  Date  07/22/16  Educator  [facility]  Instruction Review Code  2- meets goals/outcomes      Depression: - Provides group verbal and written instruction on the correlation between heart/lung disease and depressed mood, treatment options, and the stigmas associated with seeking treatment.   Anatomy & Physiology of the Heart: - Group verbal and written instruction and models provide basic cardiac anatomy and physiology, with the coronary electrical and arterial systems. Review of: AMI, Angina, Valve disease, Heart Failure, Cardiac Arrhythmia, Pacemakers, and the ICD. Flowsheet Row Cardiac Rehab from 07/29/2016 in [facility] Cardiac and Pulmonary Rehab  Date  07/20/16  Educator  CE  Instruction Review Code  2- meets goals/outcomes      Cardiac Procedures: - Group verbal and written instruction and models to describe the testing methods done to diagnose heart disease. Reviews the outcomes of the test results. Describes the treatment choices: Medical Management, Angioplasty, or Coronary Bypass Surgery.   Cardiac Medications: - Group verbal and written instruction to review commonly prescribed medications for heart disease. Reviews the medication, class of the drug, and side effects. Includes the steps to properly store meds and maintain the prescription regimen. Flowsheet Row Cardiac Rehab from 07/29/2016 in [facility] Cardiac and Pulmonary Rehab  Date  07/29/16 Marisue Humble 1]  Educator  SB  Instruction Review Code  2- meets goals/outcomes      Go Sex-Intimacy & Heart Disease, Get SMART - Goal Setting: - Group verbal and written instruction through game format to discuss heart disease and the return to sexual intimacy. Provides group verbal and written material to discuss and apply goal setting through the application of the S.M.A.R.T. Method.   Other Matters of the Heart: - Provides group verbal, written materials and models to describe Heart Failure, Angina, Valve Disease, and  Diabetes in the realm of heart disease. Includes description of the disease process and treatment options available to the cardiac patient. Flowsheet Row Cardiac Rehab from 07/29/2016 in [facility] Cardiac and Pulmonary Rehab  Date  07/20/16  Educator  CE  Instruction Review Code  2- meets goals/outcomes      Exercise & Equipment Safety: - Individual verbal instruction and demonstration of equipment use and safety with use of the equipment. Flowsheet Row Cardiac Rehab from 07/29/2016 in [facility] Cardiac and Pulmonary Rehab  Date  06/22/16  Educator  C. [place]  Instruction Review Code  1- partially meets, needs review/practice      Infection Prevention: -  Provides verbal and written material to individual with discussion of infection control including proper hand washing and proper equipment cleaning during exercise session. Flowsheet Row Cardiac Rehab from 07/29/2016 in [facility] Cardiac and Pulmonary Rehab  Date  06/22/16  Educator  C. [place]kin, R.N.  Instruction Review Code  2- meets goals/outcomes      Falls Prevention: - Provides verbal and written material to individual with discussion of falls prevention and safety. Flowsheet Row Cardiac Rehab from 07/29/2016 in [facility] Cardiac and Pulmonary Rehab  Date  06/22/16  Educator  C. [place]  Instruction Review Code  2- meets goals/outcomes      Diabetes: - Individual verbal and written instruction to review signs/symptoms of diabetes, desired ranges of glucose level fasting, after meals and with exercise. Advice that pre and post exercise glucose checks will be done for 3 sessions at entry of program. [place] from 07/29/2016 in [facility] Cardiac and Pulmonary Rehab  Date  06/22/16  Educator  C. [place]  Instruction Review Code  1- partially meets, needs review/practice       Knowledge Questionnaire Score:     Knowledge Questionnaire Score - 06/22/16 1204      Knowledge Questionnaire Score   Pre Score 26/28       Core Components/Risk Factors/Patient Goals at Admission:     Personal Goals and Risk Factors at Admission - 06/22/16 1208      Core Components/Risk Factors/Patient Goals on Admission    Weight Management Yes;Weight Loss   Intervention Weight Management: Develop a combined nutrition and exercise program designed to reach desired caloric intake, while maintaining appropriate intake of nutrient and fiber, sodium and fats, and appropriate energy expenditure required for the weight goal.;Weight Management: Provide education and appropriate resources to help participant work on and attain dietary goals.   Admit Weight 209 lb 8 oz (95 kg)   Goal Weight: Short Term 204 lb (92.5 kg)   Goal Weight: Long Term 200 lb (90.7 kg)   Expected Outcomes Short Term: Continue to assess and modify interventions until short term weight is achieved;Long Term: Adherence to nutrition and physical activity/exercise program aimed toward attainment of established weight goal;Weight Loss: Understanding of general recommendations for a balanced deficit meal plan, which promotes 1-2 lb weight loss per week and includes a negative energy balance of [phone] kcal/d;Weight Maintenance: Understanding of the daily nutrition guidelines, which includes 25-35% calories from fat, 7% or less cal from saturated fats, less than 253m cholesterol, less than 1.5gm of sodium, & 5 or more servings of fruits and vegetables daily;Understanding recommendations for meals to include 15-35% energy as protein, 25-35% energy from fat, 35-60% energy from carbohydrates, less than 2041mof dietary cholesterol, 20-35 gm of total fiber daily;Understanding of distribution of calorie intake throughout the day with the consumption of 4-5 meals/snacks   Increase Strength and Stamina Yes   Intervention Provide advice, education, support and counseling about physical activity/exercise needs.;Develop an individualized exercise prescription for aerobic and  resistive training based on initial evaluation findings, risk stratification, comorbidities and participant's personal goals.   Expected Outcomes Achievement of increased cardiorespiratory fitness and enhanced flexibility, muscular endurance and strength shown through measurements of functional capacity and personal statement of participant.   Diabetes Yes   Intervention Provide education about signs/symptoms and action to take for hypo/hyperglycemia.;Provide education about proper nutrition, including hydration, and aerobic/resistive exercise prescription along with prescribed medications to achieve blood glucose in normal ranges: Fasting glucose 65-99 mg/dL  DeVicente Malesaid his last HBA1C  was 8. He said he "bottoms out when he takes his insulin sliding scale as prescribed.    Expected Outcomes Short Term: Participant verbalizes understanding of the signs/symptoms and immediate care of hyper/hypoglycemia, proper foot care and importance of medication, aerobic/resistive exercise and nutrition plan for blood glucose control.;Long Term: Attainment of HbA1C < 7%.   Heart Failure Yes   Intervention Provide a combined exercise and nutrition program that is supplemented with education, support and counseling about heart failure. Directed toward relieving symptoms such as shortness of breath, decreased exercise tolerance, and extremity edema.   Expected Outcomes Improve functional capacity of life;Short term: Attendance in program 2-3 days a week with increased exercise capacity. Reported lower sodium intake. Reported increased fruit and vegetable intake. Reports medication compliance.;Short term: Daily weights obtained and reported for increase. Utilizing diuretic protocols set by physician.;Long term: Adoption of self-care skills and reduction of barriers for early signs and symptoms recognition and intervention leading to self-care maintenance.   Hypertension Yes   Intervention Provide education on lifestyle  modifcations including regular physical activity/exercise, weight management, moderate sodium restriction and increased consumption of fresh fruit, vegetables, and low fat dairy, alcohol moderation, and smoking cessation.;Monitor prescription use compliance.   Expected Outcomes Short Term: Continued assessment and intervention until BP is < 140/45m HG in hypertensive participants. < 130/871mHG in hypertensive participants with diabetes, heart failure or chronic kidney disease.;Long Term: Maintenance of blood pressure at goal levels.   Lipids Yes   Intervention Provide education and support for participant on nutrition & aerobic/resistive exercise along with prescribed medications to achieve LDL <7063mHDL >47m66m Expected Outcomes Short Term: Participant states understanding of desired cholesterol values and is compliant with medications prescribed. Participant is following exercise prescription and nutrition guidelines.;Long Term: Cholesterol controlled with medications as prescribed, with individualized exercise RX and with personalized nutrition plan. Value goals: LDL < 70mg31mL > 40 mg.      Core Components/Risk Factors/Patient Goals Review:      Goals and Risk Factor Review    Row Name 07/06/16 1023 07/22/16 0843           Core Components/Risk Factors/Patient Goals Review   Personal Goals Review Weight Management/Obesity;Sedentary;Increase Strength and Stamina;Diabetes;Lipids;Hypertension;Heart Failure Weight Management/Obesity;Sedentary;Increase Strength and Stamina;Diabetes;Heart Failure;Lipids;Hypertension      Review Franklin Douglas to a good start with rehab.  He is already riding his recumbent bike and walking at home as well.  His blood sugars have been good and staying between 150-180.  He is not checking his blood pressure at home, but they have been good when here.  He is weighing daily, but was up some today.  He was surprised at how much salt is in diet soda and is trying to cut  back.  He has not had any problems with his statin medicaiton. Franklin Douglas well with rehab.  He is exercising regularly at home and has started hiking more too.  Despite his increase in exercise his weight has remained steady.  He would like to lose more weight.  He is struggling with reducing the sodium in his diet.  He now has an appointment with the nutritionist next Friday.  He is trying to read labels but hopes meeting the dietician will help.  He is doing well with his blood sugars and blood pressures.  He has not had any problems with his statins.        Expected Outcomes Franklin Douglas continue to come to exercise and education  classes for risk factor modification. Franklin Douglas will continue to come to exercise and education.  He will also meet with dietician to work on modifications for weight loss.         Core Components/Risk Factors/Patient Goals at Discharge (Final Review):      Goals and Risk Factor Review - 07/22/16 0843      Core Components/Risk Factors/Patient Goals Review   Personal Goals Review Weight Management/Obesity;Sedentary;Increase Strength and Stamina;Diabetes;Heart Failure;Lipids;Hypertension   Review Franklin Douglas is doing well with rehab.  He is exercising regularly at home and has started hiking more too.  Despite his increase in exercise his weight has remained steady.  He would like to lose more weight.  He is struggling with reducing the sodium in his diet.  He now has an appointment with the nutritionist next Friday.  He is trying to read labels but hopes meeting the dietician will help.  He is doing well with his blood sugars and blood pressures.  He has not had any problems with his statins.     Expected Outcomes Franklin Douglas will continue to come to exercise and education.  He will also meet with dietician to work on modifications for weight loss.      ITP Comments:     ITP Comments    Row Name 06/22/16 1208 06/22/16 1210 07/15/16 1122 08/12/16 0601 08/14/16 0950   ITP Comments  Franklin Douglas said he has already met with a dietician for his diabetes. He said he does not feel like he needs to meet with the Cardiac REhab REgistered Dietician. Franklin Douglas said his last HBA1C was 8. He said he "bottoms out when he takes his insulin sliding scale as prescribed. 30 day review. Continue with ITP unless changes noted by Medical Director at signature of review. 30 day review completed for Medical Director physician review and signature. Continue ITP unless changes made by physician. Called to check on status of return.  Out for over a week.  Left message on machine at home   Franklin Douglas Name 09/01/16 1517           ITP Comments Called to check on status of return.  Franklin Douglas has returned to work and will no longer be able to attend rehab at this time.  He will be discharged from the program.          Comments: Discharge ITP

## 2016-09-01 NOTE — Telephone Encounter (Signed)
Called to check on status of return.  Franklin Douglas has returned to work and will no longer be able to attend rehab at this time.  He will be discharged from the program.

## 2017-02-08 ENCOUNTER — Encounter: Payer: Self-pay | Admitting: Emergency Medicine

## 2017-02-08 ENCOUNTER — Ambulatory Visit
Admission: EM | Admit: 2017-02-08 | Discharge: 2017-02-08 | Disposition: A | Discharge status: Home / Self Care | Payer: BLUE CROSS/BLUE SHIELD | Attending: Family Medicine | Admitting: Family Medicine

## 2017-02-08 DIAGNOSIS — M5442 Lumbago with sciatica, left side: Secondary | ICD-10-CM

## 2017-02-08 MED ORDER — KETOROLAC TROMETHAMINE 60 MG/2ML IM SOLN
60.0000 mg | Freq: Once | INTRAMUSCULAR | Status: AC
  Administered 2017-02-08: 60 mg via INTRAMUSCULAR

## 2017-02-08 MED ORDER — MELOXICAM 15 MG PO TABS: 15.0000 mg | ORAL_TABLET | Freq: Every day | ORAL | 0 refills | Status: DC | PRN

## 2017-02-08 MED ORDER — CYCLOBENZAPRINE HCL 10 MG PO TABS: 10.0000 mg | ORAL_TABLET | Freq: Two times a day (BID) | ORAL | 0 refills | Status: DC | PRN

## 2017-02-08 NOTE — ED Triage Notes (Signed)
Patient states that he injured his back on Saturday.  Patient states that he was lifting and carrying grocerries on Saturday.

## 2017-02-08 NOTE — Discharge Instructions (Signed)
Take medication as prescribed. Rest. Drink plenty of fluids. Ice. Avoid strenuous activity.   Follow up with your primary care physician or orthopedic this week. Return to Urgent care or ER for increased pain, weakness, urinary or bowel changes, new or worsening concerns.

## 2017-02-08 NOTE — ED Provider Notes (Signed)
[facility] ____________________________________________  Time seen: Approximately 9:11 AM  I have reviewed the triage vital signs and the nursing notes.   HISTORY  Chief Complaint Back Pain   HPI Franklin Douglas is a 46 y.o. male present for evaluation of low back pain that is been present for the last 2 days. Patient reports this past Saturday morning he was putting up groceries in his house. Patient states that he bent down to grab his groceries and unsure if he twisted but felt pain immediately once he stood up. Patient reports the same pain has been present in the same location since onset. States pain medication is in left lower back at the top of buttocks. Patient reports he has had a similar pain in the past  with his sciatica. Patient reports he has previous history of low back pain that improved after having multiple spinal injections. Reports this was several years ago. Denies any recent back issues. Patient states that he did not fall Saturday. Denies any fall, direct injury or direct trauma. Patient states that he has been trying over-the-counter NSAIDs, ice and stretching that does help but no resolution. Patient states pain is currently a 5 or 6 out of 10. States pain is worse with movement and improves with rest.  Patient reports pain does intermittently radiate down left posterior leg. Reports he does have some mild peripheral neuropathy secondary to his diabetes and distal toes and has been unchanged. Denies any other paresthesias. Denies any urinary or bowel retention or incontinence. Reports that he did notice that his gait is a little bit different with the pain. Denies any other pain, paresthesias, extremity weakness. Denies fevers. Reports continues to eat and drink well.  Denies chest pain, shortness of breath, abdominal pain, dysuria, extremity pain, extremity swelling or rash. Denies recent sickness. Denies recent antibiotic use. 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   PCP : At [facility]    Past Medical History:  Diagnosis Date  . Diabetes mellitus without complication (HCC)   . Diabetic neuropathy (HCC)   . Diverticula, colon   . Diverticulitis   . Sciatica     Patient Active Problem List   Diagnosis Date Noted  . AA (alcohol abuse) 12/02/2015  . 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 Diabetes mellitus (HCC) 05/12/2013  . BP (high blood pressure) 05/12/2013  . Pure hypercholesterolemia 05/12/2013    Past Surgical History:  Procedure Laterality Date  . CARDIAC SURGERY    . COLON SURGERY    . HERNIA REPAIR       No current facility-administered medications for this encounter.   Current Outpatient Prescriptions:  .  albuterol (PROVENTIL HFA;VENTOLIN HFA) 108 (90 BASE) MCG/ACT inhaler, Inhale 1-2 puffs into the lungs every 6 (six) hours as needed for wheezing or shortness of breath. (Patient not taking: Reported on 06/22/2016), Disp: 1 Inhaler, Rfl: 0 .  aspirin EC 81 MG tablet, Take 81 mg by mouth., Disp: , Rfl:  .  atorvastatin (LIPITOR) 80 MG tablet, Take 80 mg by mouth., Disp: , Rfl:  .  benzonatate (TESSALON) 200 MG capsule, Take 1 capsule (200 mg total) by mouth 3 (three) times daily as needed for cough. (Patient not taking: Reported on 06/22/2016), Disp: 30 capsule, Rfl: 0 .  carvedilol (COREG) 12.5 MG tablet, Take 12.5 mg by mouth., Disp: , Rfl:  .  chlordiazePOXIDE (LIBRIUM) 25 MG capsule, 50mg  PO TID x 1D, then 25-50mg  PO  BID X 1D, then 25-50mg  PO QD X 1D (Patient not taking: Reported on 06/22/2016), Disp: 10 capsule, Rfl: 0 .  clopidogrel (PLAVIX) 75 MG tablet, Take 75 mg by mouth., Disp: , Rfl:  .  cyclobenzaprine (FLEXERIL) 10 MG tablet, Take 1 tablet (10 mg total) by mouth 2 (two) times daily as needed for muscle spasms. Do not drive while taking as can cause drowsiness., Disp: 20 tablet, Rfl: 0 .  furosemide (LASIX) 20 MG tablet, TAKE ONE TABLET  BY MOUTH TWICE DAILY, Disp: , Rfl:  .  HYDROcodone-acetaminophen (NORCO/VICODIN) 5-325 MG tablet, Take 1-2 tablets by mouth every 6 (six) hours as needed for severe pain. (Patient not taking: Reported on 06/22/2016), Disp: 20 tablet, Rfl: 0 .  insulin detemir (LEVEMIR) 100 UNIT/ML injection, Inject 5 Units into the skin at bedtime. , Disp: , Rfl:  .  lisinopril (PRINIVIL,ZESTRIL) 5 MG tablet, TAKE ONE TABLET BY MOUTH ONCE DAILY, Disp: , Rfl:  .  meloxicam (MOBIC) 15 MG tablet, Take 1 tablet (15 mg total) by mouth daily as needed., Disp: 10 tablet, Rfl: 0 .  spironolactone (ALDACTONE) 25 MG tablet, Take 12.5 mg by mouth., Disp: , Rfl:  .  triamcinolone cream (KENALOG) 0.1 %, Apply 1 application topically 2 (two) times daily., Disp: 30 g, Rfl: 0  Allergies Sulfa antibiotics  Family History  Problem Relation Age of Onset  . Diabetes Mother   . Diabetes Brother     Social History Social History  Substance Use Topics  . Smoking status: Never Smoker  . Smokeless tobacco: Never Used  . Alcohol use 12.0 oz/week    20 Shots of liquor per week     Comment: 8 drinks/ day    Review of Systems Constitutional: No fever/chills Cardiovascular: Denies chest pain. Respiratory: Denies shortness of breath. Gastrointestinal: No abdominal pain.  No nausea, no vomiting.  No diarrhea.  No constipation. Genitourinary: Negative for dysuria. Musculoskeletal: Positive for back pain. Skin: Negative for rash.  ____________________________________________   PHYSICAL EXAM:  VITAL SIGNS: ED Triage Vitals  Enc Vitals Group     BP 02/08/17 0907 (!) 155/89     Pulse Rate 02/08/17 0907 96     Resp 02/08/17 0907 16     Temp 02/08/17 0907 98.1 F (36.7 C)     Temp Source 02/08/17 0907 Oral     SpO2 02/08/17 0907 97 %     Weight 02/08/17 0905 210 lb (95.3 kg)     Height 02/08/17 0905 5' 11.5" (1.816 m)     Head Circumference --      Peak Flow --      Pain Score 02/08/17 0906 4     Pain Loc --       Pain Edu? --      Excl. in GC? --     Constitutional: Alert and oriented. Well appearing and in no acute distress. ENT      Head: Normocephalic and atraumatic.      Mouth/Throat: Mucous membranes are moist. Neck: No stridor. Supple without meningismus.  Cardiovascular: Normal rate, regular rhythm. Grossly normal heart sounds.  Good peripheral circulation. Respiratory: Normal respiratory effort without tachypnea nor retractions. Breath sounds are clear and equal bilaterally. No wheezes, rales, rhonchi. Gastrointestinal: Soft and nontender. No distention. Normal Bowel sounds. No CVA tenderness. Musculoskeletal:  Nontender with normal range of motion in all extremities. No midline cervical, thoracic or lumbar tenderness to palpation. Bilateral pedal pulses equal and easily palpated.      Right  lower leg:  No tenderness or edema.      Left lower leg:  No tenderness or edema.  Except: Left lower paralumbar and left greater sciatic notch mild to moderate tenderness to direct palpation, no midline tenderness, no swelling, no ecchymosis, no erythema, right straight leg test negative, mild pain with left straight leg at approximately 45, 5 out of 5 strength to bilateral upper and lower extremities, no pain with bilateral standing knee lifts, 2+ patellar and Achilles tendon reflexes bilaterally, bilateral distal pulses equal pulses equal and easily palpated, no saddle anesthesia, steady gait in room, changes positions quickly in room, bilateral plantar flexion strong and equal, slightly weaker left dorsiflexion than right but full range of motion present. Neurologic:  Normal speech and language. No gross focal neurologic deficits are appreciated. Speech is normal. No gait instability.  Skin:  Skin is warm, dry and intact. No rash noted. Psychiatric: Mood and affect are normal. Speech and behavior are normal. Patient exhibits appropriate insight and judgment     ___________________________________________   LABS (all labs ordered are listed, but only abnormal results are displayed)  Labs Reviewed - No data to display  RADIOLOGY  No results found. ____________________________________________   PROCEDURES Procedures    INITIAL IMPRESSION / ASSESSMENT AND PLAN / ED COURSE  Pertinent labs & imaging results that were available during my care of the patient were reviewed by me and considered in my medical decision making (see chart for details).  Well-appearing patient. No acute distress. Denies direct trauma or fall. No midline tenderness. Reports history of similar back pain in past with sciatica. Discussed evaluation of x-ray, and will defer as no fall or direct trauma and will treat conservatively with medication, patient agrees to this. Encouraged patient to have close PCP orthopedic follow-up for continued complaints and discuss may need physical therapy or MRI outpatient. Discussed strict follow-up and return parameters including any weakness, paresthesias, increased pain, decreased range of motion or worsening concerns. Will treat patient with oral daily Mobic and when necessary Flexeril. Encouraged rest, fluids, stretching, ice and supportive care.Discussed indication, risks and benefits of medications with patient.  Discussed follow up with Primary care physician this week. Discussed follow up and return parameters including no resolution or any worsening concerns. Patient verbalized understanding and agreed to plan.   ____________________________________________   FINAL CLINICAL IMPRESSION(S) / ED DIAGNOSES  Final diagnoses:  Acute left-sided low back pain with left-sided sciatica     Discharge Medication List as of 02/08/2017  9:44 AM    START taking these medications   Details  cyclobenzaprine (FLEXERIL) 10 MG tablet Take 1 tablet (10 mg total) by mouth 2 (two) times daily as needed for muscle spasms. Do not drive while  taking as can cause drowsiness., Starting Mon 02/08/2017, Normal    meloxicam (MOBIC) 15 MG tablet Take 1 tablet (15 mg total) by mouth daily as needed., Starting Mon 02/08/2017, Normal        Note: This dictation was prepared with Dragon dictation along with smaller phrase technology. Any transcriptional errors that result from this process are unintentional.         Renford Dills, NP 02/10/17 [phone]

## 2017-02-10 ENCOUNTER — Ambulatory Visit
Admission: EM | Admit: 2017-02-10 | Discharge: 2017-02-10 | Disposition: A | Discharge status: Home / Self Care | Payer: Self-pay | Attending: Family Medicine | Admitting: Family Medicine

## 2017-02-10 DIAGNOSIS — M5432 Sciatica, left side: Secondary | ICD-10-CM

## 2017-02-10 MED ORDER — HYDROCODONE-ACETAMINOPHEN 5-325 MG PO TABS: ORAL_TABLET | ORAL | 0 refills | Status: DC

## 2017-02-10 MED ORDER — PREDNISONE 20 MG PO TABS: ORAL_TABLET | ORAL | 0 refills | Status: DC

## 2017-02-10 NOTE — ED Provider Notes (Signed)
[facility]    CSN: [id number] Arrival date & time: 02/10/17  1324     History   Chief Complaint Chief Complaint  Patient presents with  . Back Pain    HPI Franklin Douglas is a 46 y.o. male.   The history is provided by the patient.  Back Pain  Location:  Lumbar spine and gluteal region Quality:  Aching Radiates to:  L thigh and L knee Pain severity:  Moderate Pain is:  Same all the time Onset quality:  Sudden Duration:  6 days Timing:  Constant Progression:  Unchanged Chronicity:  New Context: lifting heavy objects and twisting   Context: not emotional stress, not falling, not jumping from heights, not MCA, not MVA, not occupational injury, not pedestrian accident, not physical stress, not recent illness and not recent injury   Relieved by:  Nothing Worsened by:  Movement Ineffective treatments:  NSAIDs and muscle relaxants (seen here 2 days ago and given Mobic and flexeril; states has not improved) Associated symptoms: no abdominal pain, no abdominal swelling, no bladder incontinence, no bowel incontinence, no chest pain, no dysuria, no fever, no headaches, no leg pain, no numbness, no paresthesias, no pelvic pain, no perianal numbness, no tingling, no weakness and no weight loss     Past Medical History:  Diagnosis Date  . Diabetes mellitus without complication (HCC)   . Diabetic neuropathy (HCC)   . Diverticula, colon   . Diverticulitis   . Sciatica     Patient Active Problem List   Diagnosis Date Noted  . AA (alcohol abuse) 12/02/2015  . 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 Diabetes mellitus (HCC) 05/12/2013  . BP (high blood pressure) 05/12/2013  . Pure hypercholesterolemia 05/12/2013    Past Surgical History:  Procedure Laterality Date  . CARDIAC SURGERY    . COLON SURGERY    . HERNIA REPAIR         Home Medications    Prior to Admission medications   Medication Sig Start Date End Date Taking? Authorizing Provider  aspirin  EC 81 MG tablet Take 81 mg by mouth.   Yes [provider]  atorvastatin (LIPITOR) 80 MG tablet Take 80 mg by mouth. 05/27/16 05/27/17 Yes [provider]  carvedilol (COREG) 12.5 MG tablet Take 12.5 mg by mouth.   Yes [provider]  clopidogrel (PLAVIX) 75 MG tablet Take 75 mg by mouth. 05/27/16 05/27/17 Yes [provider]  cyclobenzaprine (FLEXERIL) 10 MG tablet Take 1 tablet (10 mg total) by mouth 2 (two) times daily as needed for muscle spasms. Do not drive while taking as can cause drowsiness. 02/08/17  Yes Renford Dills, NP  furosemide (LASIX) 20 MG tablet TAKE ONE TABLET BY MOUTH TWICE DAILY 05/22/16  Yes [provider]  insulin detemir (LEVEMIR) 100 UNIT/ML injection Inject 5 Units into the skin at bedtime.    Yes [provider]  lisinopril (PRINIVIL,ZESTRIL) 5 MG tablet TAKE ONE TABLET BY MOUTH ONCE DAILY 06/18/16  Yes [provider]  meloxicam (MOBIC) 15 MG tablet Take 1 tablet (15 mg total) by mouth daily as needed. 02/08/17  Yes Renford Dills, NP  spironolactone (ALDACTONE) 25 MG tablet Take 12.5 mg by mouth. 05/01/16  Yes [provider]  triamcinolone cream (KENALOG) 0.1 % Apply 1 application topically 2 (two) times daily. 01/30/16  Yes Lutricia Feil, PA-C  albuterol (PROVENTIL HFA;VENTOLIN HFA) 108 (90 BASE) MCG/ACT inhaler Inhale 1-2 puffs into the lungs every 6 (six) hours as  needed for wheezing or shortness of breath. Patient not taking: Reported on 06/22/2016 02/27/15   [name], MD  benzonatate (TESSALON) 200 MG capsule Take 1 capsule (200 mg total) by mouth 3 (three) times daily as needed for cough. Patient not taking: Reported on 06/22/2016 02/27/15   [name], MD  chlordiazePOXIDE (LIBRIUM) 25 MG capsule 50mg  PO TID x 1D, then 25-50mg  PO BID X 1D, then 25-50mg  PO QD X 1D Patient not taking: Reported on 06/22/2016 12/11/15   Pricilla Loveless, MD  HYDROcodone-acetaminophen (NORCO/VICODIN) 5-325 MG  tablet 1-2 tabs po bid prn 02/10/17   [name], MD  predniSONE (DELTASONE) 20 MG tablet 3 tabs once day 1, then 2 tabs po qd for 2 days, then 1 tab po qd for 2 days, then half a tab po qd for 2 days 02/10/17   [name], MD    Family History Family History  Problem Relation Age of Onset  . Diabetes Mother   . Diabetes Brother     Social History Social History  Substance Use Topics  . Smoking status: Never Smoker  . Smokeless tobacco: Never Used  . Alcohol use 12.0 oz/week    20 Shots of liquor per week     Comment: 8 drinks/ day     Allergies   Sulfa antibiotics   Review of Systems Review of Systems  Constitutional: Negative for fever and weight loss.  Cardiovascular: Negative for chest pain.  Gastrointestinal: Negative for abdominal pain and bowel incontinence.  Genitourinary: Negative for bladder incontinence, dysuria and pelvic pain.  Musculoskeletal: Positive for back pain.  Neurological: Negative for tingling, weakness, numbness, headaches and paresthesias.     Physical Exam Triage Vital Signs ED Triage Vitals  Enc Vitals Group     BP 02/10/17 0929 139/87     Pulse Rate 02/10/17 0929 99     Resp 02/10/17 0929 18     Temp 02/10/17 0929 98.3 F (36.8 C)     Temp Source 02/10/17 0929 Oral     SpO2 02/10/17 0929 99 %     Weight 02/10/17 0927 210 lb (95.3 kg)     Height 02/10/17 0927 5' 11.5" (1.816 m)     Head Circumference --      Peak Flow --      Pain Score 02/10/17 0928 4     Pain Loc --      Pain Edu? --      Excl. in GC? --    No data found.   Updated Vital Signs BP 139/87 (BP Location: Left Arm)   Pulse 99   Temp 98.3 F (36.8 C) (Oral)   Resp 18   Ht 5' 11.5" (1.816 m)   Wt 210 lb (95.3 kg)   SpO2 99%   BMI 28.88 kg/m   Visual Acuity Right Eye Distance:   Left Eye Distance:   Bilateral Distance:    Right Eye Near:   Left Eye Near:    Bilateral Near:     Physical Exam  Constitutional: He appears well-developed and  well-nourished. No distress.  Neck: Normal range of motion. Neck supple. No tracheal deviation present.  Pulmonary/Chest: Effort normal. No stridor. No respiratory distress.  Musculoskeletal:       Cervical back: Normal. He exhibits normal range of motion, no tenderness, no bony tenderness, no swelling, no edema, no deformity, no laceration, no pain, no spasm and normal pulse.       Lumbar back: He exhibits tenderness (over the left  lumbar sacral paraspinous muscles and over the left buttock) and spasm. He exhibits normal range of motion, no bony tenderness, no swelling, no edema, no deformity, no laceration, no pain and normal pulse.  Neurological: He is alert. He has normal reflexes. He displays normal reflexes. He exhibits normal muscle tone. Coordination normal.  Skin: No rash noted. He is not diaphoretic.  Nursing note and vitals reviewed.    UC Treatments / Results  Labs (all labs ordered are listed, but only abnormal results are displayed) Labs Reviewed - No data to display  EKG  EKG Interpretation None       Radiology No results found.  Procedures Procedures (including critical care time)  Medications Ordered in UC Medications - No data to display   Initial Impression / Assessment and Plan / UC Course  I have reviewed the triage vital signs and the nursing notes.  Pertinent labs & imaging results that were available during my care of the patient were reviewed by me and considered in my medical decision making (see chart for details).       Final Clinical Impressions(s) / UC Diagnoses   Final diagnoses:  Sciatica of left side    New Prescriptions Discharge Medication List as of 02/10/2017  9:44 AM    START taking these medications   Details  predniSONE (DELTASONE) 20 MG tablet 3 tabs once day 1, then 2 tabs po qd for 2 days, then 1 tab po qd for 2 days, then half a tab po qd for 2 days, Normal       1. diagnosis reviewed with patient 2. rx as per orders  above; reviewed possible side effects, interactions, risks and benefits; rx for vicodin printed/given as per orders as well  3. Recommend supportive treatment with warm compresses, stretches 4. Follow-up prn if symptoms worsen or don't improve; discussed with patient; discussed with patient may need follow up with PCP and/or back specialist    Payton Mccallumonty, Janisa Labus, MD 02/10/17 1137

## 2017-02-10 NOTE — ED Triage Notes (Signed)
Patient complains of left lower back pain. Patient states that he was here on Monday for this and has not had any relief. Patient reports that pain is worse with standing.

## 2017-04-30 IMAGING — CR DG CHEST 2V
2 series · 2 of 2 positions shown · non-contrast
Comparison: 08/30/2013

CLINICAL DATA: Cough, sweating, diarrhea

EXAM:
CHEST  2 VIEW

[chest pa]
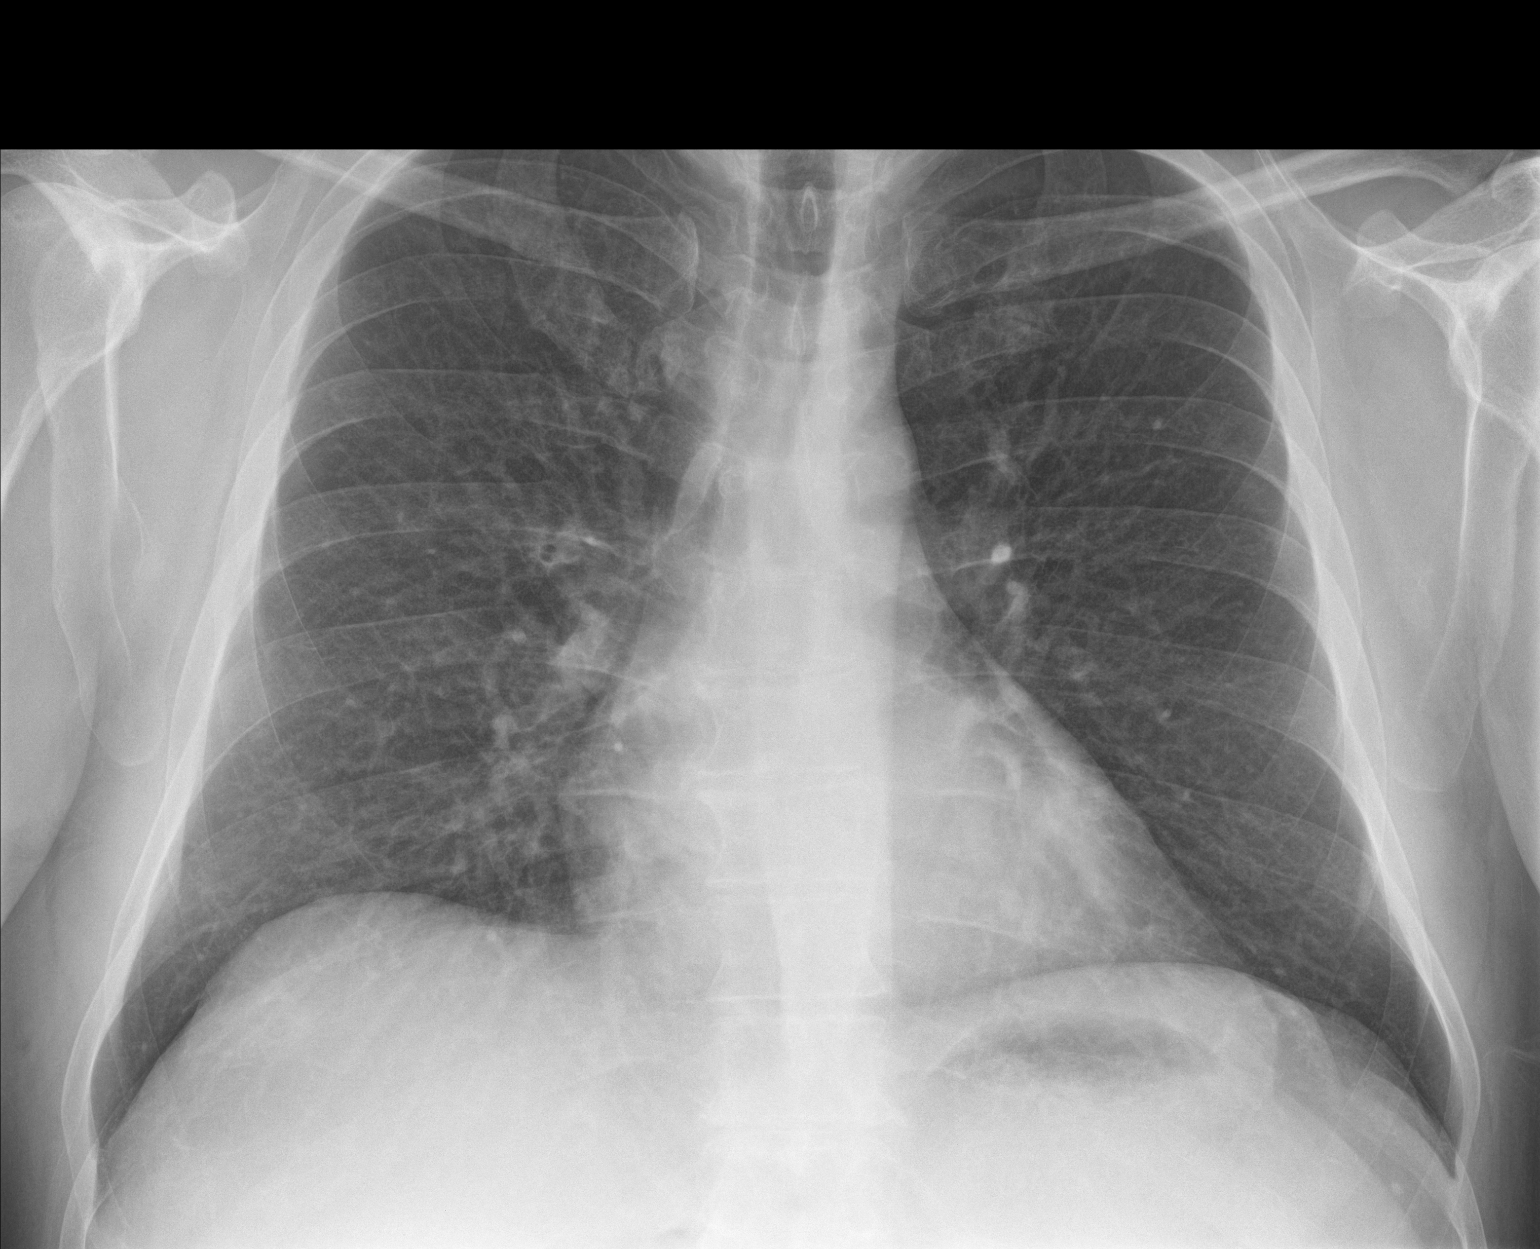

[chest lat]
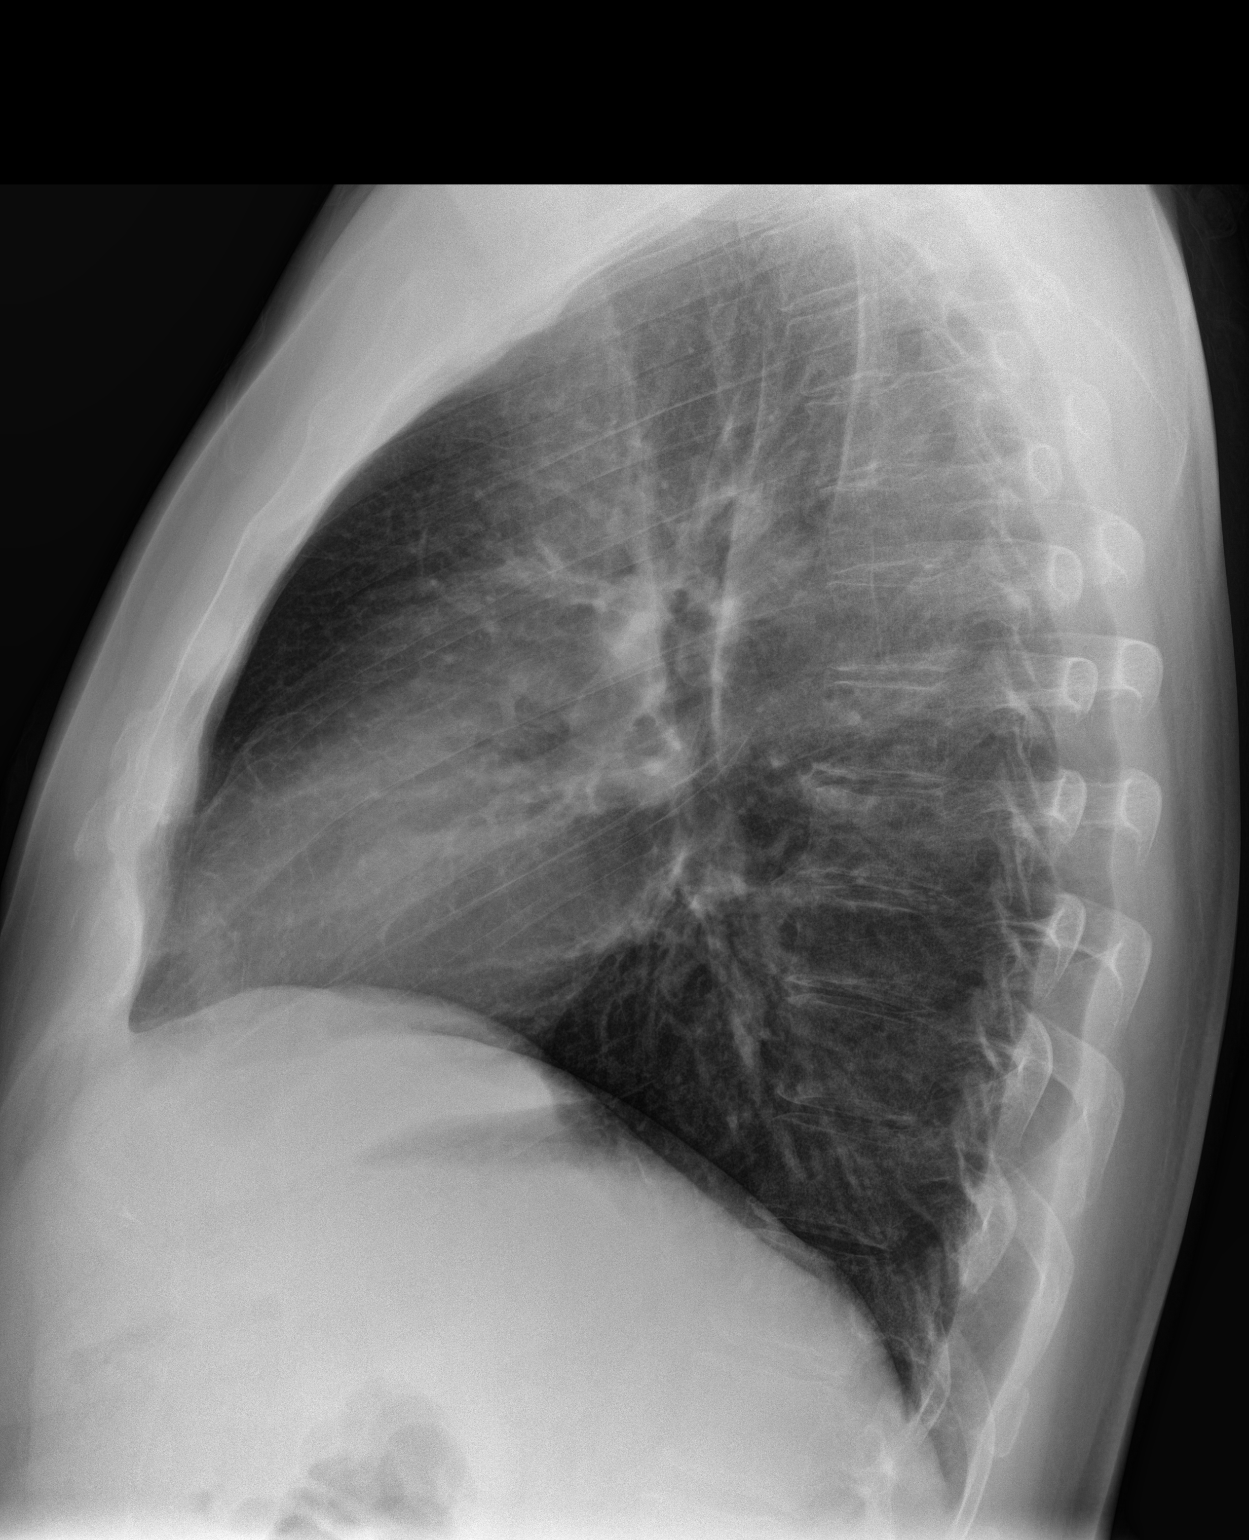

[2 of 2 positions shown; findings below may reference images not displayed]

FINDINGS: Cardiomediastinal silhouette is stable. No acute infiltrate or
pleural effusion. No pulmonary edema. Bony thorax is unremarkable.
IMPRESSION: No active cardiopulmonary disease.

## 2017-05-14 ENCOUNTER — Encounter (HOSPITAL_COMMUNITY): Payer: Self-pay | Admitting: Emergency Medicine

## 2017-05-14 ENCOUNTER — Emergency Department (HOSPITAL_COMMUNITY)
Admission: EM | Admit: 2017-05-14 | Discharge: 2017-05-14 | Disposition: A | Discharge status: Home / Self Care | Payer: Managed Care, Other (non HMO) | Attending: Emergency Medicine | Admitting: Emergency Medicine

## 2017-05-14 ENCOUNTER — Emergency Department (HOSPITAL_COMMUNITY): Payer: Managed Care, Other (non HMO)

## 2017-05-14 DIAGNOSIS — Z794 Long term (current) use of insulin: Secondary | ICD-10-CM | POA: Diagnosis not present

## 2017-05-14 DIAGNOSIS — N179 Acute kidney failure, unspecified: Secondary | ICD-10-CM | POA: Insufficient documentation

## 2017-05-14 DIAGNOSIS — R079 Chest pain, unspecified: Secondary | ICD-10-CM | POA: Diagnosis present

## 2017-05-14 DIAGNOSIS — E860 Dehydration: Secondary | ICD-10-CM | POA: Insufficient documentation

## 2017-05-14 DIAGNOSIS — E1165 Type 2 diabetes mellitus with hyperglycemia: Secondary | ICD-10-CM | POA: Diagnosis not present

## 2017-05-14 DIAGNOSIS — Z79899 Other long term (current) drug therapy: Secondary | ICD-10-CM | POA: Diagnosis not present

## 2017-05-14 DIAGNOSIS — R739 Hyperglycemia, unspecified: Secondary | ICD-10-CM

## 2017-05-14 DIAGNOSIS — R1013 Epigastric pain: Secondary | ICD-10-CM | POA: Diagnosis not present

## 2017-05-14 LAB — I-STAT VENOUS BLOOD GAS, ED
Acid-Base Excess: 9 mmol/L — ABNORMAL HIGH (ref 0.0–2.0)
Bicarbonate: 31.9 mmol/L — ABNORMAL HIGH (ref 20.0–28.0)
O2 SAT: 82 %
TCO2: 33 mmol/L (ref 0–100)
pCO2, Ven: 37.9 mmHg — ABNORMAL LOW (ref 44.0–60.0)
pH, Ven: 7.534 — ABNORMAL HIGH (ref 7.250–7.430)
pO2, Ven: 41 mmHg (ref 32.0–45.0)

## 2017-05-14 LAB — BLOOD GAS, VENOUS

## 2017-05-14 LAB — BASIC METABOLIC PANEL
ANION GAP: 14 (ref 5–15)
BUN: 47 mg/dL — ABNORMAL HIGH (ref 6–20)
CALCIUM: 8.7 mg/dL — AB (ref 8.9–10.3)
CHLORIDE: 84 mmol/L — AB (ref 101–111)
CO2: 27 mmol/L (ref 22–32)
Creatinine, Ser: 2.06 mg/dL — ABNORMAL HIGH (ref 0.61–1.24)
GFR calc non Af Amer: 37 mL/min — ABNORMAL LOW (ref >60)
GFR, EST AFRICAN AMERICAN: 43 mL/min — AB (ref >60)
Glucose, Bld: 449 mg/dL — ABNORMAL HIGH (ref 65–99)
Potassium: 3.7 mmol/L (ref 3.5–5.1)
SODIUM: 125 mmol/L — AB (ref 135–145)

## 2017-05-14 LAB — HEPATIC FUNCTION PANEL
ALBUMIN: 4.2 g/dL (ref 3.5–5.0)
ALK PHOS: 76 U/L (ref 38–126)
ALT: 30 U/L (ref 17–63)
AST: 33 U/L (ref 15–41)
BILIRUBIN TOTAL: 2.2 mg/dL — AB (ref 0.3–1.2)
Bilirubin, Direct: 0.3 mg/dL (ref 0.1–0.5)
Indirect Bilirubin: 1.9 mg/dL — ABNORMAL HIGH (ref 0.3–0.9)
Total Protein: 6.8 g/dL (ref 6.5–8.1)

## 2017-05-14 LAB — CBC
HCT: 37.5 % — ABNORMAL LOW (ref 39.0–52.0)
HEMOGLOBIN: 13 g/dL (ref 13.0–17.0)
MCH: 32.4 pg (ref 26.0–34.0)
MCHC: 34.7 g/dL (ref 30.0–36.0)
MCV: 93.5 fL (ref 78.0–100.0)
Platelets: 160 10*3/uL (ref 150–400)
RBC: 4.01 MIL/uL — AB (ref 4.22–5.81)
RDW: 12.1 % (ref 11.5–15.5)
WBC: 5.8 10*3/uL (ref 4.0–10.5)

## 2017-05-14 LAB — I-STAT TROPONIN, ED
Troponin i, poc: 0.01 ng/mL (ref 0.00–0.08)
Troponin i, poc: 0.01 ng/mL (ref 0.00–0.08)

## 2017-05-14 LAB — LIPASE, BLOOD: Lipase: 60 U/L — ABNORMAL HIGH (ref 11–51)

## 2017-05-14 MED ORDER — GI COCKTAIL ~~LOC~~
30.0000 mL | Freq: Once | ORAL | Status: AC
  Administered 2017-05-14: 30 mL via ORAL
  Filled 2017-05-14: qty 30

## 2017-05-14 MED ORDER — SODIUM CHLORIDE 0.9 % IV BOLUS (SEPSIS)
1000.0000 mL | Freq: Once | INTRAVENOUS | Status: AC
  Administered 2017-05-14: 1000 mL via INTRAVENOUS

## 2017-05-14 MED ORDER — INSULIN ASPART 100 UNIT/ML ~~LOC~~ SOLN
15.0000 [IU] | Freq: Once | SUBCUTANEOUS | Status: AC
  Administered 2017-05-14: 15 [IU] via INTRAVENOUS
  Filled 2017-05-14: qty 1

## 2017-05-14 NOTE — ED Provider Notes (Signed)
[facility] Provider Note   CSN: [id number] Arrival date & time: 05/14/17  1020     History   Chief Complaint Chief Complaint  Patient presents with  . Chest Pain  . Dizziness  . Emesis    HPI Franklin Douglas is a 46 y.o. male.  46 yo M with a chief complaints of chest pain. Is described as epigastric seems to come and go has moved to the left side as well as along the epigastrium. Nothing seems to make this better or worse. He also has been feeling a bit of a headache upon standing. Has not passed out. Has a history of an MI in the past and this feels different. History of hypertension hyperlipidemia diabetes. This been having difficulty controlling his blood sugar at home. Denies history of PE or DVT. Denies lower extremity edema recent surgery prolonged immobilization testosterone use.   The history is provided by the patient.  Chest Pain   This is a new problem. The current episode started 2 days ago. The problem occurs constantly. The problem has not changed since onset.The pain is present in the epigastric region. The pain is at a severity of 6/10. The pain is moderate. The quality of the pain is described as burning and brief. The pain does not radiate. Duration of episode(s) is 2 days. Associated symptoms include abdominal pain and nausea. Pertinent negatives include no dizziness, no fever, no headaches, no palpitations, no shortness of breath and no vomiting. He has tried nothing for the symptoms. The treatment provided no relief.  His past medical history is significant for diabetes, hyperlipidemia, hypertension and MI.  Pertinent negatives for past medical history include no DVT and no PE.  Dizziness  Associated symptoms: chest pain and nausea   Associated symptoms: no diarrhea, no headaches, no palpitations, no shortness of breath and no vomiting   Emesis   Associated symptoms include abdominal pain. Pertinent negatives include no arthralgias, no chills, no  diarrhea, no fever, no headaches and no myalgias.    Past Medical History:  Diagnosis Date  . Diabetes mellitus without complication (HCC)   . Diabetic neuropathy (HCC)   . Diverticula, colon   . Diverticulitis   . Sciatica     Patient Active Problem List   Diagnosis Date Noted  . AA (alcohol abuse) 12/02/2015  . 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 Diabetes mellitus (HCC) 05/12/2013  . BP (high blood pressure) 05/12/2013  . Pure hypercholesterolemia 05/12/2013    Past Surgical History:  Procedure Laterality Date  . CARDIAC SURGERY    . COLON SURGERY    . HERNIA REPAIR         Home Medications    Prior to Admission medications   Medication Sig Start Date End Date Taking? Authorizing Provider  atorvastatin (LIPITOR) 80 MG tablet Take 80 mg by mouth every evening.  05/27/16 05/27/17 Yes [provider]  carvedilol (COREG) 12.5 MG tablet Take 12.5 mg by mouth 2 (two) times daily with a meal.    Yes [provider]  clopidogrel (PLAVIX) 75 MG tablet Take 75 mg by mouth. 05/27/16 05/27/17 Yes [provider]  fluticasone (FLONASE) 50 MCG/ACT nasal spray Place 2 sprays into both nostrils daily.   Yes [provider]  insulin aspart (NOVOLOG) 100 UNIT/ML injection Inject 3-6 Units into the skin See admin instructions. Sliding scale 130-160=1unit, 161-190=2units, 191-220=3units...Marland KitchenMarland KitchenMarland Kitchenevery 30points for BS reading equal 1 unit 08/25/16  Yes [provider]  insulin detemir (LEVEMIR) 100 UNIT/ML  injection Inject 50 Units into the skin at bedtime.    Yes [provider]  lisinopril (PRINIVIL,ZESTRIL) 5 MG tablet TAKE 5mg  BY MOUTH ONCE DAILY in the evening 06/18/16  Yes [provider]  loratadine (CLARITIN) 10 MG tablet Take 10 mg by mouth daily.   Yes [provider]  pseudoephedrine (SUDAFED) 30 MG tablet Take 30 mg by mouth every 4 (four) hours as needed for congestion.   Yes [provider]    spironolactone (ALDACTONE) 25 MG tablet Take 12.5 mg by mouth. 05/01/16  Yes [provider]  triamcinolone cream (KENALOG) 0.1 % Apply 1 application topically 2 (two) times daily. Patient taking differently: Apply 1 application topically 2 (two) times daily as needed (irritation).  01/30/16  Yes Lutricia Feiloemer, William P, PA-C  albuterol (PROVENTIL HFA;VENTOLIN HFA) 108 (90 BASE) MCG/ACT inhaler Inhale 1-2 puffs into the lungs every 6 (six) hours as needed for wheezing or shortness of breath. 02/27/15   Payton Mccallumonty, [place], MD  benzonatate (TESSALON) 200 MG capsule Take 1 capsule (200 mg total) by mouth 3 (three) times daily as needed for cough. Patient not taking: Reported on 06/22/2016 02/27/15   Payton Mccallumonty, [place], MD  chlordiazePOXIDE (LIBRIUM) 25 MG capsule 50mg  PO TID x 1D, then 25-50mg  PO BID X 1D, then 25-50mg  PO QD X 1D Patient not taking: Reported on 06/22/2016 12/11/15   Pricilla LovelessGoldston, Scott, MD  cyclobenzaprine (FLEXERIL) 10 MG tablet Take 1 tablet (10 mg total) by mouth 2 (two) times daily as needed for muscle spasms. Do not drive while taking as can cause drowsiness. Patient not taking: Reported on 05/14/2017 02/08/17   Renford DillsMiller, Lindsey, NP  HYDROcodone-acetaminophen (NORCO/VICODIN) 5-325 MG tablet 1-2 tabs po bid prn Patient not taking: Reported on 05/14/2017 02/10/17   Payton Mccallumonty, [place], MD  meloxicam (MOBIC) 15 MG tablet Take 1 tablet (15 mg total) by mouth daily as needed. Patient not taking: Reported on 05/14/2017 02/08/17   Renford DillsMiller, Lindsey, NP    Family History Family History  Problem Relation Age of Onset  . Diabetes Mother   . Diabetes Brother     Social History Social History  Substance Use Topics  . Smoking status: Never Smoker  . Smokeless tobacco: Never Used  . Alcohol use 12.0 oz/week    20 Shots of liquor per week     Comment: 8 drinks/ day     Allergies   Sulfa antibiotics   Review of Systems Review of Systems  Constitutional: Negative for chills and fever.  HENT: Negative for  congestion and facial swelling.   Eyes: Negative for discharge and visual disturbance.  Respiratory: Negative for shortness of breath.   Cardiovascular: Positive for chest pain. Negative for palpitations.  Gastrointestinal: Positive for abdominal pain and nausea. Negative for diarrhea and vomiting.  Musculoskeletal: Negative for arthralgias and myalgias.  Skin: Negative for color change and rash.  Neurological: Positive for light-headedness. Negative for dizziness, tremors, syncope and headaches.  Psychiatric/Behavioral: Negative for confusion and dysphoric mood.     Physical Exam Updated Vital Signs BP 140/78   Pulse 92   Temp 98 F (36.7 C)   Resp 16   Ht 5\' 11"  (1.803 m)   Wt 95.3 kg (210 lb)   SpO2 97%   BMI 29.29 kg/m   Physical Exam  Constitutional: He is oriented to person, place, and time. He appears well-developed and well-nourished.  HENT:  Head: Normocephalic and atraumatic.  Eyes: Pupils are equal, round, and reactive to light. EOM are normal.  Neck:  Normal range of motion. Neck supple. No JVD present.  Cardiovascular: Normal rate and regular rhythm.  Exam reveals no gallop and no friction rub.   No murmur heard. Pulmonary/Chest: No respiratory distress. He has no wheezes.  Abdominal: He exhibits no distension and no mass. There is tenderness (epigastric). There is no rebound and no guarding.  Musculoskeletal: Normal range of motion.  Neurological: He is alert and oriented to person, place, and time.  Skin: No rash noted. No pallor.  Psychiatric: He has a normal mood and affect. His behavior is normal.  Nursing note and vitals reviewed.    ED Treatments / Results  Labs (all labs ordered are listed, but only abnormal results are displayed) Labs Reviewed  BASIC METABOLIC PANEL - Abnormal; Notable for the following:       Result Value   Sodium 125 (*)    Chloride 84 (*)    Glucose, Bld 449 (*)    BUN 47 (*)    Creatinine, Ser 2.06 (*)    Calcium 8.7 (*)     GFR calc non Af Amer 37 (*)    GFR calc Af Amer 43 (*)    All other components within normal limits  CBC - Abnormal; Notable for the following:    RBC 4.01 (*)    HCT 37.5 (*)    All other components within normal limits  LIPASE, BLOOD - Abnormal; Notable for the following:    Lipase 60 (*)    All other components within normal limits  HEPATIC FUNCTION PANEL - Abnormal; Notable for the following:    Total Bilirubin 2.2 (*)    Indirect Bilirubin 1.9 (*)    All other components within normal limits  I-STAT VENOUS BLOOD GAS, ED - Abnormal; Notable for the following:    pH, Ven 7.534 (*)    pCO2, Ven 37.9 (*)    Bicarbonate 31.9 (*)    Acid-Base Excess 9.0 (*)    All other components within normal limits  BLOOD GAS, VENOUS  URINALYSIS, ROUTINE W REFLEX MICROSCOPIC  I-STAT TROPONIN, ED  I-STAT TROPONIN, ED    EKG  EKG Interpretation  Date/Time:  Friday May 14 2017 10:23:00 EDT Ventricular Rate:  96 PR Interval:    QRS Duration: 99 QT Interval:  414 QTC Calculation: 524 R Axis:   69 Text Interpretation:  Sinus rhythm Probable left atrial enlargement Consider anterior infarct Prolonged QT interval No significant change since last tracing Confirmed by Melene Plan [phone]) on 05/14/2017 10:59:57 AM       Radiology Dg Chest 2 View  Result Date: 05/14/2017 CLINICAL DATA:  Chest pain for 2 days EXAM: CHEST  2 VIEW COMPARISON:  11/19/2015 FINDINGS: Cardiac shadow is within normal limits. The lungs are well aerated bilaterally. No focal infiltrate or sizable effusion is seen. Degenerative changes of the thoracic spine are noted. IMPRESSION: No active cardiopulmonary disease. Electronically Signed   By: Alcide Clever M.D.   On: 05/14/2017 10:56    Procedures Procedures (including critical care time)  Medications Ordered in ED Medications  insulin aspart (novoLOG) injection 15 Units (not administered)  sodium chloride 0.9 % bolus 1,000 mL (0 mLs Intravenous Stopped 05/14/17  1435)  gi cocktail (Maalox,Lidocaine,Donnatal) (30 mLs Oral Given 05/14/17 1344)  sodium chloride 0.9 % bolus 1,000 mL (0 mLs Intravenous Stopped 05/14/17 1539)     Initial Impression / Assessment and Plan / ED Course  I have reviewed the triage vital signs and the nursing notes.  Pertinent labs &  imaging results that were available during my care of the patient were reviewed by me and considered in my medical decision making (see chart for details).     46 yo M With a chief complaint of epigastric discomfort and lightheadedness. Clinically appears dehydrated labs consistent with the same as well as has an AKI. Given IV fluids.  The patient is electing for possible outpatient therapy. We'll give him 2 L of fluids and then discharge home for recheck within the week.  Delta trop negative.  D/c home.   4:20 PM:  I have discussed the diagnosis/risks/treatment options with the patient and family and believe the pt to be eligible for discharge home to follow-up with PCP. We also discussed returning to the ED immediately if new or worsening sx occur. We discussed the sx which are most concerning (e.g., sudden worsening pain, fever, inability to tolerate by mouth) that necessitate immediate return. Medications administered to the patient during their visit and any new prescriptions provided to the patient are listed below.  Medications given during this visit Medications  insulin aspart (novoLOG) injection 15 Units (not administered)  sodium chloride 0.9 % bolus 1,000 mL (0 mLs Intravenous Stopped 05/14/17 1435)  gi cocktail (Maalox,Lidocaine,Donnatal) (30 mLs Oral Given 05/14/17 1344)  sodium chloride 0.9 % bolus 1,000 mL (0 mLs Intravenous Stopped 05/14/17 1539)     The patient appears reasonably screen and/or stabilized for discharge and I doubt any other medical condition or other [facility] requiring further screening, evaluation, or treatment in the ED at this time prior to discharge.    Final  Clinical Impressions(s) / ED Diagnoses   Final diagnoses:  Epigastric abdominal pain  Hyperglycemia  Dehydration  AKI (acute kidney injury) [facility])    New Prescriptions New Prescriptions   No medications on file     Melene Plan, DO 05/14/17 1620

## 2017-05-14 NOTE — ED Triage Notes (Signed)
Arrived via EMS patient complaint of chest pain for 2 days and dizziness for 1. EMS CBG 468. Nausea and vomiting for 3-4 days.  Prior to arrival has aspirin 324mg  and 1 nitro.

## 2017-05-14 NOTE — Discharge Instructions (Signed)
Follow up with your PCP, they may want to change your insulin regimen.

## 2018-02-12 ENCOUNTER — Emergency Department: Payer: Managed Care, Other (non HMO)

## 2018-02-12 ENCOUNTER — Other Ambulatory Visit: Payer: Self-pay

## 2018-02-12 ENCOUNTER — Inpatient Hospital Stay
Admission: EM | Admit: 2018-02-12 | Discharge: 2018-02-13 | DRG: 378 | Discharge status: Against Medical Advice | Payer: Managed Care, Other (non HMO) | Attending: Internal Medicine | Admitting: Internal Medicine

## 2018-02-12 ENCOUNTER — Encounter: Payer: Self-pay | Admitting: Emergency Medicine

## 2018-02-12 DIAGNOSIS — I2510 Atherosclerotic heart disease of native coronary artery without angina pectoris: Secondary | ICD-10-CM | POA: Diagnosis present

## 2018-02-12 DIAGNOSIS — Z79899 Other long term (current) drug therapy: Secondary | ICD-10-CM

## 2018-02-12 DIAGNOSIS — F10239 Alcohol dependence with withdrawal, unspecified: Secondary | ICD-10-CM

## 2018-02-12 DIAGNOSIS — E860 Dehydration: Secondary | ICD-10-CM | POA: Diagnosis present

## 2018-02-12 DIAGNOSIS — E1140 Type 2 diabetes mellitus with diabetic neuropathy, unspecified: Secondary | ICD-10-CM | POA: Diagnosis present

## 2018-02-12 DIAGNOSIS — I10 Essential (primary) hypertension: Secondary | ICD-10-CM | POA: Diagnosis present

## 2018-02-12 DIAGNOSIS — Z5321 Procedure and treatment not carried out due to patient leaving prior to being seen by health care provider: Secondary | ICD-10-CM | POA: Diagnosis not present

## 2018-02-12 DIAGNOSIS — Z882 Allergy status to sulfonamides status: Secondary | ICD-10-CM

## 2018-02-12 DIAGNOSIS — R748 Abnormal levels of other serum enzymes: Secondary | ICD-10-CM

## 2018-02-12 DIAGNOSIS — K920 Hematemesis: Principal | ICD-10-CM | POA: Diagnosis present

## 2018-02-12 DIAGNOSIS — E876 Hypokalemia: Secondary | ICD-10-CM | POA: Diagnosis present

## 2018-02-12 DIAGNOSIS — Y903 Blood alcohol level of 60-79 mg/100 ml: Secondary | ICD-10-CM | POA: Diagnosis present

## 2018-02-12 DIAGNOSIS — F1010 Alcohol abuse, uncomplicated: Secondary | ICD-10-CM | POA: Diagnosis present

## 2018-02-12 DIAGNOSIS — N179 Acute kidney failure, unspecified: Secondary | ICD-10-CM | POA: Diagnosis present

## 2018-02-12 DIAGNOSIS — F10939 Alcohol use, unspecified with withdrawal, unspecified: Secondary | ICD-10-CM

## 2018-02-12 DIAGNOSIS — E861 Hypovolemia: Secondary | ICD-10-CM | POA: Diagnosis present

## 2018-02-12 DIAGNOSIS — K2921 Alcoholic gastritis with bleeding: Secondary | ICD-10-CM | POA: Diagnosis present

## 2018-02-12 DIAGNOSIS — Z794 Long term (current) use of insulin: Secondary | ICD-10-CM

## 2018-02-12 DIAGNOSIS — K226 Gastro-esophageal laceration-hemorrhage syndrome: Secondary | ICD-10-CM | POA: Diagnosis present

## 2018-02-12 DIAGNOSIS — E871 Hypo-osmolality and hyponatremia: Secondary | ICD-10-CM | POA: Diagnosis present

## 2018-02-12 MED ORDER — METOCLOPRAMIDE HCL 5 MG/ML IJ SOLN
10.0000 mg | Freq: Once | INTRAMUSCULAR | Status: AC
Start: 2018-02-12 — End: 2018-02-12
  Administered 2018-02-12: 10 mg via INTRAVENOUS
  Filled 2018-02-12: qty 2

## 2018-02-12 MED ORDER — SODIUM CHLORIDE 0.9 % IV BOLUS
1000.0000 mL | Freq: Once | INTRAVENOUS | Status: AC
  Administered 2018-02-12: 1000 mL via INTRAVENOUS

## 2018-02-12 MED ORDER — TRANEXAMIC ACID 1000 MG/10ML IV SOLN
1000.0000 mg | Freq: Once | INTRAVENOUS | Status: AC
  Administered 2018-02-13: 1000 mg via INTRAVENOUS
  Filled 2018-02-12: qty 10

## 2018-02-12 MED ORDER — PANTOPRAZOLE SODIUM 40 MG PO TBEC: 40.0000 mg | DELAYED_RELEASE_TABLET | Freq: Every day | ORAL | Status: DC

## 2018-02-12 MED ORDER — PANTOPRAZOLE SODIUM 40 MG PO TBEC
DELAYED_RELEASE_TABLET | ORAL | Status: AC
  Administered 2018-02-13: 40 mg via ORAL
  Filled 2018-02-12: qty 1

## 2018-02-12 MED ORDER — SODIUM CHLORIDE 0.9 % IV SOLN
1.0000 g | Freq: Once | INTRAVENOUS | Status: AC
  Administered 2018-02-13: 1 g via INTRAVENOUS
  Filled 2018-02-12: qty 10

## 2018-02-12 MED ORDER — PHENOBARBITAL SODIUM 130 MG/ML IJ SOLN
130.0000 mg | Freq: Once | INTRAMUSCULAR | Status: AC
  Administered 2018-02-13: 130 mg via INTRAVENOUS
  Filled 2018-02-12: qty 1

## 2018-02-12 NOTE — ED Notes (Signed)
ED Provider at bedside. 

## 2018-02-12 NOTE — ED Triage Notes (Signed)
Pt arrives via [facility] for emesis x 3-4 times. Pt has 18 gauge via EMS in left [facility]. Pt is in NAD.

## 2018-02-12 NOTE — ED Notes (Signed)
Assisted pt to standing beside wheelchair in triage to get blood pressure and HR while standing; pt was able to stand for about 15 seconds before reporting dizziness but was able to stand for BP to finish;

## 2018-02-12 NOTE — ED Provider Notes (Signed)
[facility] Emergency Department Provider Note  ____________________________________________   First MD Initiated Contact with Patient 02/12/18 2335     (approximate)  I have reviewed the triage vital signs and the nursing notes.   HISTORY  Chief Complaint Hematemesis   HPI Franklin Douglas is a 47 y.o. male who comes to the emergency department via EMS with 1 day of nausea and vomiting and about 1 hour of vomiting dark red blood.  He is an alcoholic who drinks heavily every day.  He has never vomited blood before.  His vomiting is associated with mild to moderate cramping upper abdominal discomfort that seems to be somewhat improved after vomiting.  He does report some mild chest pain at this time and a little bit of shortness of breath.  He has been unable to drink much alcohol today secondary to nausea.  He has a previous history of diverticulitis requiring exploratory laparotomy and colon resection.  His symptoms seem to be somewhat worse when trying to eat and somewhat improved with rest.  His nausea is mostly constant.  He has never had an endoscopy.  He does take Plavix for coronary artery disease.  No other blood thinning medications.  Past Medical History:  Diagnosis Date  . Diabetes mellitus without complication (HCC)   . Diabetic neuropathy (HCC)   . Diverticula, colon   . Diverticulitis   . Sciatica     Patient Active Problem List   Diagnosis Date Noted  . Hematemesis 02/13/2018  . AA (alcohol abuse) 12/02/2015  . 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 Diabetes mellitus (HCC) 05/12/2013  . BP (high blood pressure) 05/12/2013  . Pure hypercholesterolemia 05/12/2013    Past Surgical History:  Procedure Laterality Date  . CARDIAC SURGERY    . COLON SURGERY    . HERNIA REPAIR      Prior to Admission medications   Medication Sig Start Date End Date Taking? Authorizing Provider  aspirin EC 81 MG tablet Take 81 mg by mouth daily.   Yes  [provider]  atorvastatin (LIPITOR) 80 MG tablet Take 80 mg by mouth every evening.  05/27/16 02/13/18 Yes [provider]  carvedilol (COREG) 12.5 MG tablet Take 12.5 mg by mouth 2 (two) times daily with a meal.    Yes [provider]  fluticasone (FLONASE) 50 MCG/ACT nasal spray Place 2 sprays into both nostrils daily.   Yes [provider]  insulin aspart (NOVOLOG) 100 UNIT/ML injection Inject 3-6 Units into the skin See admin instructions. Sliding scale 130-160=1unit, 161-190=2units, 191-220=3units...Marland KitchenMarland KitchenMarland Kitchenevery 30points for BS reading equal 1 unit 08/25/16  Yes [provider]  lisinopril (PRINIVIL,ZESTRIL) 5 MG tablet TAKE  BY MOUTH ONCE DAILY in the evening 06/18/16  Yes [provider]  pseudoephedrine (SUDAFED) 30 MG tablet Take 30 mg by mouth every 4 (four) hours as needed for congestion.   Yes [provider]  albuterol (PROVENTIL HFA;VENTOLIN HFA) 108 (90 BASE) MCG/ACT inhaler Inhale 1-2 puffs into the lungs every 6 (six) hours as needed for wheezing or shortness of breath. Patient not taking: Reported on 02/13/2018 02/27/15   [name], MD  benzonatate (TESSALON) 200 MG capsule Take 1 capsule (200 mg total) by mouth 3 (three) times daily as needed for cough. Patient not taking: Reported on 06/22/2016 02/27/15   [name], MD  chlordiazePOXIDE (LIBRIUM) 25 MG capsule  PO TID x 1D, then 25-50mg  PO BID X 1D, then 25-50mg  PO QD X 1D Patient not taking: Reported  on 06/22/2016 12/11/15   Pricilla Loveless, MD  cyclobenzaprine (FLEXERIL) 10 MG tablet Take 1 tablet (10 mg total) by mouth 2 (two) times daily as needed for muscle spasms. Do not drive while taking as can cause drowsiness. Patient not taking: Reported on 05/14/2017 02/08/17   Renford Dills, NP  HYDROcodone-acetaminophen (NORCO/VICODIN) 5-325 MG tablet 1-2 tabs po bid prn Patient not taking: Reported on 05/14/2017 02/10/17   [name], MD  insulin detemir  (LEVEMIR) 100 UNIT/ML injection Inject 50 Units into the skin at bedtime.     [provider]  loratadine (CLARITIN) 10 MG tablet Take 10 mg by mouth daily.    [provider]  meloxicam (MOBIC) 15 MG tablet Take 1 tablet (15 mg total) by mouth daily as needed. Patient not taking: Reported on 05/14/2017 02/08/17   Renford Dills, NP  spironolactone (ALDACTONE) 25 MG tablet Take 12.5 mg by mouth. 05/01/16   [provider]  triamcinolone cream (KENALOG) 0.1 % Apply 1 application topically 2 (two) times daily. Patient not taking: Reported on 02/13/2018 01/30/16   Lutricia Feil, PA-C    Allergies Sulfa antibiotics  Family History  Problem Relation Age of Onset  . Diabetes Mother   . Diabetes Brother     Social History Social History   Tobacco Use  . Smoking status: Never Smoker  . Smokeless tobacco: Never Used  Substance Use Topics  . Alcohol use: Yes    Alcohol/week: 12.0 oz    Types: 20 Shots of liquor per week    Comment: 8 drinks/ day  . Drug use: No    Review of Systems Constitutional: No fever/chills Eyes: No visual changes. ENT: No sore throat. Cardiovascular: Positive for chest pain. Respiratory: Positive for shortness of breath. Gastrointestinal: Positive for abdominal pain.  Positive for nausea, positive for vomiting.  No diarrhea.  No constipation. Genitourinary: Negative for dysuria. Musculoskeletal: Negative for back pain. Skin: Negative for rash. Neurological: Negative for headaches, focal weakness or numbness.   ____________________________________________   PHYSICAL EXAM:  VITAL SIGNS: ED Triage Vitals [02/12/18 2325]  Enc Vitals Group     BP (!) 98/51     Pulse Rate (!) 115     Resp 18     Temp 97.7 F (36.5 C)     Temp src      SpO2 98 %     Weight 215 lb (97.5 kg)     Height 5' 11.5" (1.816 m)     Head Circumference      Peak Flow      Pain Score 0     Pain Loc      Pain Edu?      Excl. in GC?      Constitutional: Alert and oriented x4 appears obviously uncomfortable sitting upright in bed holding his upper abdomen Eyes: PERRL EOMI. Head: Atraumatic. Nose: No congestion/rhinnorhea. Mouth/Throat: No trismus Neck: No stridor.   Cardiovascular: Tachycardic rate, regular rhythm. Grossly normal heart sounds.  Good peripheral circulation. Respiratory: Increased respiratory effort.  No retractions. Lungs CTAB and moving good air Gastrointestinal: Distended abdomen mild diffuse tenderness with no focality no rebound or guarding no peritonitis Musculoskeletal: No lower extremity edema   Neurologic:  Normal speech and language. No gross focal neurologic deficits are appreciated. Skin:  Skin is warm, dry and intact. No rash noted. Psychiatric: Mood and affect are normal. Speech and behavior are normal.    ____________________________________________   DIFFERENTIAL includes but not limited to  Boerhaave syndrome, esophageal  varices, bleeding ulcer, Mallory-Weiss tear ____________________________________________   LABS (all labs ordered are listed, but only abnormal results are displayed)  Labs Reviewed  LIPASE, BLOOD - Abnormal; Notable for the following components:      Result Value   Lipase 70 (*)    All other components within normal limits  COMPREHENSIVE METABOLIC PANEL - Abnormal; Notable for the following components:   Sodium 132 (*)    Potassium 3.2 (*)    Chloride 89 (*)    BUN 56 (*)    Creatinine, Ser 2.51 (*)    Total Protein 8.8 (*)    Albumin 5.3 (*)    AST 51 (*)    GFR calc non Af Amer 29 (*)    GFR calc Af Amer 33 (*)    Anion gap 19 (*)    All other components within normal limits  CBC - Abnormal; Notable for the following components:   MCH 34.3 (*)    All other components within normal limits  ETHANOL - Abnormal; Notable for the following components:   Alcohol, Ethyl (B) 78 (*)    All other components within normal limits  PROTIME-INR  URINALYSIS,  COMPLETE (UACMP) WITH MICROSCOPIC  HIV ANTIBODY (ROUTINE TESTING)  BASIC METABOLIC PANEL  CBC  CBG MONITORING, ED    Lab work reviewed by me shows hypochloremic hyponatremia consistent with dehydration.  Elevated creatinine consistent with acute kidney injury __________________________________________  EKG   ____________________________________________  RADIOLOGY  Chest x-ray reviewed by me with no acute disease ____________________________________________   PROCEDURES  Procedure(s) performed: no  Procedures  Critical Care performed: no  Observation: no ____________________________________________   INITIAL IMPRESSION / ASSESSMENT AND PLAN / ED COURSE  Pertinent labs & imaging results that were available during my care of the patient were reviewed by me and considered in my medical decision making (see chart for details).  The patient arrives with borderline low blood pressure with hematemesis and a strong history of alcohol abuse.  I do not have any documented history of cirrhosis but I have high clinical suspicion so we will give a gram of ceftriaxone now along with fluids and Protonix.     The patient feels improved unfortunately his chest x-ray shows no evidence of Boerhaave syndrome.  He is clinically dehydrated with hemoconcentration and hyponatremic.  At this point patient requires inpatient admission for both alcohol withdrawal and evaluation of his upper GI bleed.  The patient verbalizes understanding and agree with plan.  I then discussed with the hospitalist who has graciously agreed to admit the patient to her service. ____________________________________________   FINAL CLINICAL IMPRESSION(S) / ED DIAGNOSES  Final diagnoses:  Hematemesis with nausea  Alcohol withdrawal syndrome with complication (HCC)  Dehydration  Acute kidney injury (HCC)      NEW MEDICATIONS STARTED DURING THIS VISIT:  Current Discharge Medication List       Note:  This  document was prepared using Dragon voice recognition software and may include unintentional dictation errors.     Merrily Brittle, MD 02/13/18 [phone]

## 2018-02-12 NOTE — ED Triage Notes (Addendum)
Pt says he's been vomiting since yesterday; about 1 hour ago he's vomited 3 times with "dark reddish brown" emesis; pt says he thinks he's vomiting blood; has never done this before; denies dark stools; reports muscle cramps and soreness to abd after vomiting; pt reports feeling dizzy with standing; pt admits to normally drinking about 8 drinks daily but has only had 3 drinks today;

## 2018-02-13 ENCOUNTER — Inpatient Hospital Stay: Payer: Managed Care, Other (non HMO)

## 2018-02-13 ENCOUNTER — Other Ambulatory Visit: Payer: Self-pay

## 2018-02-13 DIAGNOSIS — Z794 Long term (current) use of insulin: Secondary | ICD-10-CM | POA: Diagnosis not present

## 2018-02-13 DIAGNOSIS — E861 Hypovolemia: Secondary | ICD-10-CM | POA: Diagnosis present

## 2018-02-13 DIAGNOSIS — I10 Essential (primary) hypertension: Secondary | ICD-10-CM | POA: Diagnosis present

## 2018-02-13 DIAGNOSIS — Z5321 Procedure and treatment not carried out due to patient leaving prior to being seen by health care provider: Secondary | ICD-10-CM | POA: Diagnosis not present

## 2018-02-13 DIAGNOSIS — K920 Hematemesis: Secondary | ICD-10-CM | POA: Diagnosis present

## 2018-02-13 DIAGNOSIS — E1140 Type 2 diabetes mellitus with diabetic neuropathy, unspecified: Secondary | ICD-10-CM | POA: Diagnosis present

## 2018-02-13 DIAGNOSIS — E871 Hypo-osmolality and hyponatremia: Secondary | ICD-10-CM | POA: Diagnosis present

## 2018-02-13 DIAGNOSIS — Z79899 Other long term (current) drug therapy: Secondary | ICD-10-CM | POA: Diagnosis not present

## 2018-02-13 DIAGNOSIS — Y903 Blood alcohol level of 60-79 mg/100 ml: Secondary | ICD-10-CM | POA: Diagnosis present

## 2018-02-13 DIAGNOSIS — F1010 Alcohol abuse, uncomplicated: Secondary | ICD-10-CM | POA: Diagnosis present

## 2018-02-13 DIAGNOSIS — N179 Acute kidney failure, unspecified: Secondary | ICD-10-CM | POA: Diagnosis present

## 2018-02-13 DIAGNOSIS — K226 Gastro-esophageal laceration-hemorrhage syndrome: Secondary | ICD-10-CM | POA: Diagnosis present

## 2018-02-13 DIAGNOSIS — E876 Hypokalemia: Secondary | ICD-10-CM | POA: Diagnosis present

## 2018-02-13 DIAGNOSIS — Z882 Allergy status to sulfonamides status: Secondary | ICD-10-CM | POA: Diagnosis not present

## 2018-02-13 DIAGNOSIS — I2510 Atherosclerotic heart disease of native coronary artery without angina pectoris: Secondary | ICD-10-CM | POA: Diagnosis present

## 2018-02-13 DIAGNOSIS — E860 Dehydration: Secondary | ICD-10-CM | POA: Diagnosis present

## 2018-02-13 DIAGNOSIS — K2921 Alcoholic gastritis with bleeding: Secondary | ICD-10-CM | POA: Diagnosis present

## 2018-02-13 LAB — BASIC METABOLIC PANEL
Anion gap: 9 (ref 5–15)
BUN: 50 mg/dL — AB (ref 6–20)
CO2: 26 mmol/L (ref 22–32)
CREATININE: 1.63 mg/dL — AB (ref 0.61–1.24)
Calcium: 8.7 mg/dL — ABNORMAL LOW (ref 8.9–10.3)
Chloride: 98 mmol/L — ABNORMAL LOW (ref 101–111)
GFR calc Af Amer: 56 mL/min — ABNORMAL LOW (ref >60)
GFR, EST NON AFRICAN AMERICAN: 49 mL/min — AB (ref >60)
GLUCOSE: 64 mg/dL — AB (ref 65–99)
Potassium: 3.1 mmol/L — ABNORMAL LOW (ref 3.5–5.1)
SODIUM: 133 mmol/L — AB (ref 135–145)

## 2018-02-13 LAB — URINALYSIS, COMPLETE (UACMP) WITH MICROSCOPIC
BACTERIA UA: NONE SEEN
BILIRUBIN URINE: NEGATIVE
Glucose, UA: 150 mg/dL — AB
Ketones, ur: 20 mg/dL — AB
LEUKOCYTES UA: NEGATIVE
NITRITE: NEGATIVE
PH: 6 (ref 5.0–8.0)
Protein, ur: 100 mg/dL — AB
SQUAMOUS EPITHELIAL / LPF: NONE SEEN (ref 0–5)
Specific Gravity, Urine: 1.02 (ref 1.005–1.030)

## 2018-02-13 LAB — GLUCOSE, CAPILLARY
GLUCOSE-CAPILLARY: 137 mg/dL — AB (ref 65–99)
GLUCOSE-CAPILLARY: 63 mg/dL — AB (ref 65–99)
GLUCOSE-CAPILLARY: 219 mg/dL — AB (ref 65–99)
Glucose-Capillary: 104 mg/dL — ABNORMAL HIGH (ref 65–99)

## 2018-02-13 LAB — CBC
HCT: 45.5 % (ref 40.0–52.0)
HEMATOCRIT: 40.9 % (ref 40.0–52.0)
Hemoglobin: 14.3 g/dL (ref 13.0–18.0)
Hemoglobin: 16 g/dL (ref 13.0–18.0)
MCH: 34.3 pg — ABNORMAL HIGH (ref 26.0–34.0)
MCH: 34.4 pg — ABNORMAL HIGH (ref 26.0–34.0)
MCHC: 34.9 g/dL (ref 32.0–36.0)
MCHC: 35.2 g/dL (ref 32.0–36.0)
MCV: 97.6 fL (ref 80.0–100.0)
MCV: 98.5 fL (ref 80.0–100.0)
PLATELETS: 179 10*3/uL (ref 150–440)
PLATELETS: 197 10*3/uL (ref 150–440)
RBC: 4.16 MIL/uL — ABNORMAL LOW (ref 4.40–5.90)
RBC: 4.66 MIL/uL (ref 4.40–5.90)
RDW: 13.9 % (ref 11.5–14.5)
RDW: 13.2 % (ref 11.5–14.5)
WBC: 6.2 10*3/uL (ref 3.8–10.6)
WBC: 7 10*3/uL (ref 3.8–10.6)

## 2018-02-13 LAB — COMPREHENSIVE METABOLIC PANEL
ALK PHOS: 77 U/L (ref 38–126)
ALT: 47 U/L (ref 17–63)
AST: 51 U/L — AB (ref 15–41)
Albumin: 5.3 g/dL — ABNORMAL HIGH (ref 3.5–5.0)
Anion gap: 19 — ABNORMAL HIGH (ref 5–15)
BILIRUBIN TOTAL: 0.9 mg/dL (ref 0.3–1.2)
BUN: 56 mg/dL — AB (ref 6–20)
CALCIUM: 9.7 mg/dL (ref 8.9–10.3)
CO2: 24 mmol/L (ref 22–32)
Chloride: 89 mmol/L — ABNORMAL LOW (ref 101–111)
Creatinine, Ser: 2.51 mg/dL — ABNORMAL HIGH (ref 0.61–1.24)
GFR calc Af Amer: 33 mL/min — ABNORMAL LOW (ref >60)
GFR calc non Af Amer: 29 mL/min — ABNORMAL LOW (ref >60)
GLUCOSE: 99 mg/dL (ref 65–99)
Potassium: 3.2 mmol/L — ABNORMAL LOW (ref 3.5–5.1)
Sodium: 132 mmol/L — ABNORMAL LOW (ref 135–145)
TOTAL PROTEIN: 8.8 g/dL — AB (ref 6.5–8.1)

## 2018-02-13 LAB — LIPASE, BLOOD: Lipase: 70 U/L — ABNORMAL HIGH (ref 11–51)

## 2018-02-13 LAB — MAGNESIUM: Magnesium: 2.3 mg/dL (ref 1.7–2.4)

## 2018-02-13 LAB — ETHANOL: ALCOHOL ETHYL (B): 78 mg/dL — AB (ref <10)

## 2018-02-13 LAB — PROTIME-INR
INR: 0.87
PROTHROMBIN TIME: 11.8 s (ref 11.4–15.2)

## 2018-02-13 MED ORDER — ACETAMINOPHEN 325 MG PO TABS: 650.0000 mg | ORAL_TABLET | Freq: Four times a day (QID) | ORAL | Status: DC | PRN

## 2018-02-13 MED ORDER — ADULT MULTIVITAMIN W/MINERALS CH
1.0000 | ORAL_TABLET | Freq: Every day | ORAL | Status: DC
  Administered 2018-02-13: 1 via ORAL
  Filled 2018-02-13: qty 1

## 2018-02-13 MED ORDER — INSULIN ASPART 100 UNIT/ML ~~LOC~~ SOLN
0.0000 [IU] | Freq: Three times a day (TID) | SUBCUTANEOUS | Status: DC
  Administered 2018-02-13: 3 [IU] via SUBCUTANEOUS
  Filled 2018-02-13: qty 1

## 2018-02-13 MED ORDER — LORAZEPAM 2 MG/ML IJ SOLN: 0.0000 mg | Freq: Two times a day (BID) | INTRAMUSCULAR | Status: DC

## 2018-02-13 MED ORDER — HYDROCODONE-ACETAMINOPHEN 5-325 MG PO TABS
1.0000 | ORAL_TABLET | ORAL | Status: DC | PRN
Start: 2018-02-13 — End: 2018-02-13

## 2018-02-13 MED ORDER — DOCUSATE SODIUM 100 MG PO CAPS
100.0000 mg | ORAL_CAPSULE | Freq: Two times a day (BID) | ORAL | Status: DC
  Administered 2018-02-13: 100 mg via ORAL
  Filled 2018-02-13: qty 1

## 2018-02-13 MED ORDER — FOLIC ACID 1 MG PO TABS
1.0000 mg | ORAL_TABLET | Freq: Every day | ORAL | Status: DC
  Administered 2018-02-13: 1 mg via ORAL
  Filled 2018-02-13: qty 1

## 2018-02-13 MED ORDER — DEXTROSE 50 % IV SOLN
25.0000 mL | Freq: Once | INTRAVENOUS | Status: AC
  Administered 2018-02-13: 25 mL via INTRAVENOUS
  Filled 2018-02-13: qty 50

## 2018-02-13 MED ORDER — ONDANSETRON HCL 4 MG PO TABS: 4.0000 mg | ORAL_TABLET | Freq: Four times a day (QID) | ORAL | Status: DC | PRN

## 2018-02-13 MED ORDER — BISACODYL 5 MG PO TBEC: 5.0000 mg | DELAYED_RELEASE_TABLET | Freq: Every day | ORAL | Status: DC | PRN

## 2018-02-13 MED ORDER — ACETAMINOPHEN 650 MG RE SUPP: 650.0000 mg | Freq: Four times a day (QID) | RECTAL | Status: DC | PRN

## 2018-02-13 MED ORDER — LORAZEPAM 2 MG/ML IJ SOLN
0.0000 mg | Freq: Four times a day (QID) | INTRAMUSCULAR | Status: DC
  Administered 2018-02-13: 1 mg via INTRAVENOUS
  Filled 2018-02-13: qty 1

## 2018-02-13 MED ORDER — PANTOPRAZOLE SODIUM 40 MG PO TBEC
40.0000 mg | DELAYED_RELEASE_TABLET | Freq: Two times a day (BID) | ORAL | Status: DC
  Administered 2018-02-13: 40 mg via ORAL
  Filled 2018-02-13: qty 1

## 2018-02-13 MED ORDER — LORAZEPAM 1 MG PO TABS: 1.0000 mg | ORAL_TABLET | Freq: Four times a day (QID) | ORAL | Status: DC | PRN

## 2018-02-13 MED ORDER — LORAZEPAM 2 MG/ML IJ SOLN: 1.0000 mg | Freq: Four times a day (QID) | INTRAMUSCULAR | Status: DC | PRN

## 2018-02-13 MED ORDER — POTASSIUM CHLORIDE IN NACL 40-0.9 MEQ/L-% IV SOLN
INTRAVENOUS | Status: DC
  Administered 2018-02-13: 75 mL/h via INTRAVENOUS
  Filled 2018-02-13 (×3): qty 1000

## 2018-02-13 MED ORDER — VITAMIN B-1 100 MG PO TABS
100.0000 mg | ORAL_TABLET | Freq: Every day | ORAL | Status: DC
  Administered 2018-02-13: 100 mg via ORAL
  Filled 2018-02-13: qty 1

## 2018-02-13 MED ORDER — THIAMINE HCL 100 MG/ML IJ SOLN: 100.0000 mg | Freq: Every day | INTRAMUSCULAR | Status: DC

## 2018-02-13 MED ORDER — PANTOPRAZOLE SODIUM 40 MG PO TBEC: 40.0000 mg | DELAYED_RELEASE_TABLET | Freq: Two times a day (BID) | ORAL | Status: DC

## 2018-02-13 MED ORDER — ONDANSETRON HCL 4 MG/2ML IJ SOLN: 4.0000 mg | Freq: Four times a day (QID) | INTRAMUSCULAR | Status: DC | PRN

## 2018-02-13 MED ORDER — ATORVASTATIN CALCIUM 20 MG PO TABS: 80.0000 mg | ORAL_TABLET | Freq: Every evening | ORAL | Status: DC

## 2018-02-13 MED ORDER — PANTOPRAZOLE SODIUM 40 MG PO TBEC
40.0000 mg | DELAYED_RELEASE_TABLET | Freq: Once | ORAL | Status: AC
  Administered 2018-02-13: 40 mg via ORAL

## 2018-02-13 MED ORDER — HEPARIN SODIUM (PORCINE) 5000 UNIT/ML IJ SOLN
5000.0000 [IU] | Freq: Three times a day (TID) | INTRAMUSCULAR | Status: DC
  Administered 2018-02-13 (×2): 5000 [IU] via SUBCUTANEOUS
  Filled 2018-02-13 (×2): qty 1

## 2018-02-13 MED ORDER — LORATADINE 10 MG PO TABS
10.0000 mg | ORAL_TABLET | Freq: Every day | ORAL | Status: DC
  Administered 2018-02-13: 10 mg via ORAL
  Filled 2018-02-13: qty 1

## 2018-02-13 MED ORDER — SODIUM CHLORIDE 0.9 % IV SOLN
Freq: Once | INTRAVENOUS | Status: AC
  Administered 2018-02-13: 03:00:00 via INTRAVENOUS

## 2018-02-13 MED ORDER — INSULIN ASPART 100 UNIT/ML ~~LOC~~ SOLN: 0.0000 [IU] | Freq: Every day | SUBCUTANEOUS | Status: DC

## 2018-02-13 MED ORDER — SODIUM CHLORIDE 0.9 % IV SOLN
2.0000 g | INTRAVENOUS | Status: DC
  Filled 2018-02-13: qty 20

## 2018-02-13 MED ORDER — INSULIN DETEMIR 100 UNIT/ML ~~LOC~~ SOLN: 20.0000 [IU] | Freq: Every day | SUBCUTANEOUS | Status: DC

## 2018-02-13 NOTE — Progress Notes (Signed)
Pharmacy Electrolyte Monitoring Consult:  Pharmacy consulted to assist in monitoring and replacing electrolytes in this 47 y.o. male admitted on 02/12/2018 with Hematemesis   Labs:  Sodium (mmol/L)  Date Value  02/12/2018 132 (L)  11/15/2014 136 (A)  08/30/2013 122 (L)   Potassium (mmol/L)  Date Value  02/12/2018 3.2 (L)  08/30/2013 4.5   Calcium (mg/dL)  Date Value  16/07/9603 9.7   Calcium, Total (mg/dL)  Date Value  54/06/8118 9.1   Albumin (g/dL)  Date Value  14/78/2956 5.3 (H)  08/30/2013 4.1    Assessment/Plan: Patient is hyponatremic/hypokalemic w/ Na 132/K 3.2. Will replace via fluids: NS + 40 mEq KCl IV @ 75 ml/hr. Will recheck electrolytes w/ am labs. Checking Mag as will and will replace as needed.  Thomasene Ripple, PharmD, BCPS Clinical Pharmacist 02/13/2018

## 2018-02-13 NOTE — H&P (Addendum)
[facility] [place] at [facility]   PATIENT NAME: Franklin Douglas    MR#:  [id number]  DATE OF BIRTH:  20-Jun-1971  DATE OF ADMISSION:  02/12/2018  PRIMARY CARE PHYSICIAN: [facility]   REQUESTING/REFERRING PHYSICIAN:   CHIEF COMPLAINT:   Chief Complaint  Patient presents with  . Hematemesis    HISTORY OF PRESENT ILLNESS: Franklin Douglas  is a 47 y.o. male with a known history of diabetes type 2, diverticulitis, sciatica, alcohol abuse and dependence, coronary artery disease, on Plavix. Patient presented to emergency room for acute onset of nausea and vomiting blood going on for the past 24 hours.  He had a least 3 episodes at home before presenting to emergency room.  His symptoms are associated with moderate epigastric pain, denies 7 out of 10 cramps, without any radiation.  The epigastric pain improves after vomiting.  Patient denies episodes of vomiting blood before. Patient admits to heavy alcohol use on a daily basis.  He has been unable to drink well with the past 24 hours, due to nausea, vomiting and abdominal pain. At the arrival to emergency room, patient was noted to be hypotensive with blood pressure at 82/50. Blood test done emergency room show alcohol level is 78, sodium at 132, potassium 3.2, AST 51 ALT 47.  INR is 0.87.  Creatinine level is 2.51. Chest x-ray, reviewed by myself is unremarkable.   PAST MEDICAL HISTORY:   Past Medical History:  Diagnosis Date  . Diabetes mellitus without complication (HCC)   . Diabetic neuropathy (HCC)   . Diverticula, colon   . Diverticulitis   . Sciatica     PAST SURGICAL HISTORY:  Past Surgical History:  Procedure Laterality Date  . CARDIAC SURGERY    . COLON SURGERY    . HERNIA REPAIR      SOCIAL HISTORY:  Social History   Tobacco Use  . Smoking status: Never Smoker  . Smokeless tobacco: Never Used  Substance Use Topics  . Alcohol use: Yes    Alcohol/week: 12.0 oz    Types: 20  Shots of liquor per week    Comment: 8 drinks/ day    FAMILY HISTORY:  Family History  Problem Relation Age of Onset  . Diabetes Mother   . Diabetes Brother     DRUG ALLERGIES:  Allergies  Allergen Reactions  . Sulfa Antibiotics Hives, Shortness Of Breath, Rash and Anaphylaxis    REVIEW OF SYSTEMS:   CONSTITUTIONAL: No fever, fatigue or weakness.  EYES: No blurred or double vision.  EARS, NOSE, AND THROAT: No tinnitus or ear pain.  RESPIRATORY: No cough, shortness of breath, wheezing or hemoptysis.  CARDIOVASCULAR: No chest pain, orthopnea, edema.  GASTROINTESTINAL: Positive for nausea and vomiting blood; no diarrhea or abdominal pain.  GENITOURINARY: No dysuria, hematuria.  ENDOCRINE: No polyuria, nocturia,  HEMATOLOGY: Positive for vomiting blood SKIN: No rash or lesion. MUSCULOSKELETAL: No joint pain.   NEUROLOGIC: No focal weakness.  PSYCHIATRY: No anxiety or depression.   MEDICATIONS AT HOME:  Prior to Admission medications   Medication Sig Start Date End Date Taking? Authorizing Provider  aspirin EC 81 MG tablet Take 81 mg by mouth daily.   Yes [provider]  atorvastatin (LIPITOR) 80 MG tablet Take 80 mg by mouth every evening.  05/27/16 02/13/18 Yes [provider]  carvedilol (COREG) 12.5 MG tablet Take 12.5 mg by mouth 2 (two) times daily with a meal.    Yes [provider]  fluticasone (  FLONASE) 50 MCG/ACT nasal spray Place 2 sprays into both nostrils daily.   Yes [provider]  insulin aspart (NOVOLOG) 100 UNIT/ML injection Inject 3-6 Units into the skin See admin instructions. Sliding scale 130-160=1unit, 161-190=2units, 191-220=3units...Marland KitchenMarland KitchenMarland Kitchenevery 30points for BS reading equal 1 unit 08/25/16  Yes [provider]  lisinopril (PRINIVIL,ZESTRIL) 5 MG tablet TAKE  BY MOUTH ONCE DAILY in the evening 06/18/16  Yes [provider]  pseudoephedrine (SUDAFED) 30 MG tablet Take 30 mg by mouth every 4 (four) hours  as needed for congestion.   Yes [provider]  albuterol (PROVENTIL HFA;VENTOLIN HFA) 108 (90 BASE) MCG/ACT inhaler Inhale 1-2 puffs into the lungs every 6 (six) hours as needed for wheezing or shortness of breath. Patient not taking: Reported on 02/13/2018 02/27/15   [name], MD  benzonatate (TESSALON) 200 MG capsule Take 1 capsule (200 mg total) by mouth 3 (three) times daily as needed for cough. Patient not taking: Reported on 06/22/2016 02/27/15   [name], MD  chlordiazePOXIDE (LIBRIUM) 25 MG capsule  PO TID x 1D, then 25-50mg  PO BID X 1D, then 25-50mg  PO QD X 1D Patient not taking: Reported on 06/22/2016 12/11/15   Pricilla Loveless, MD  cyclobenzaprine (FLEXERIL) 10 MG tablet Take 1 tablet (10 mg total) by mouth 2 (two) times daily as needed for muscle spasms. Do not drive while taking as can cause drowsiness. Patient not taking: Reported on 05/14/2017 02/08/17   Renford Dills, NP  HYDROcodone-acetaminophen (NORCO/VICODIN) 5-325 MG tablet 1-2 tabs po bid prn Patient not taking: Reported on 05/14/2017 02/10/17   [name], MD  insulin detemir (LEVEMIR) 100 UNIT/ML injection Inject 50 Units into the skin at bedtime.     [provider]  loratadine (CLARITIN) 10 MG tablet Take 10 mg by mouth daily.    [provider]  meloxicam (MOBIC) 15 MG tablet Take 1 tablet (15 mg total) by mouth daily as needed. Patient not taking: Reported on 05/14/2017 02/08/17   Renford Dills, NP  spironolactone (ALDACTONE) 25 MG tablet Take 12.5 mg by mouth. 05/01/16   [provider]  triamcinolone cream (KENALOG) 0.1 % Apply 1 application topically 2 (two) times daily. Patient not taking: Reported on 02/13/2018 01/30/16   Lutricia Feil, PA-C      PHYSICAL EXAMINATION:   VITAL SIGNS: Blood pressure 113/81, pulse (!) 115, temperature 98.5 F (36.9 C), temperature source Oral, resp. rate 18, height  (1.803 m), weight 94.6 kg (208 lb 8.9 oz), SpO2 100  %.  GENERAL:  47 y.o.-year-old patient lying in the bed, currently with no acute distress.  EYES: Pupils equal, round, reactive to light and accommodation. No scleral icterus. Extraocular muscles intact.  HEENT: Head atraumatic, normocephalic. Oropharynx and nasopharynx clear.  NECK:  Supple, no jugular venous distention. No thyroid enlargement, no tenderness.  LUNGS: Normal breath sounds bilaterally, no wheezing, rales,rhonchi or crepitation. No use of accessory muscles of respiration.  CARDIOVASCULAR: S1, S2 normal. No murmurs, rubs, or gallops.  ABDOMEN: Mildly distended, but otherwise no tenderness, no rebound/guarding. Bowel sounds present.  EXTREMITIES: No pedal edema, cyanosis, or clubbing.  NEUROLOGIC: No focal weakness. PSYCHIATRIC: The patient is alert and oriented x 3.  SKIN: No obvious rash, lesion, or ulcer.   LABORATORY PANEL:   CBC Recent Labs  Lab 02/12/18 2353  WBC 7.0  HGB 16.0  HCT 45.5  PLT 197  MCV 97.6  MCH 34.3*  MCHC 35.2  RDW 13.2   ------------------------------------------------------------------------------------------------------------------  Chemistries  Recent  Labs  Lab 02/12/18 2353  NA 132*  K 3.2*  CL 89*  CO2 24  GLUCOSE 99  BUN 56*  CREATININE 2.51*  CALCIUM 9.7  AST 51*  ALT 47  ALKPHOS 77  BILITOT 0.9   ------------------------------------------------------------------------------------------------------------------ estimated creatinine clearance is 42.7 mL/min (A) (by C-G formula based on SCr of 2.51 mg/dL (H)). ------------------------------------------------------------------------------------------------------------------ No results for input(s): TSH, T4TOTAL, T3FREE, THYROIDAB in the last 72 hours.  Invalid input(s): FREET3   Coagulation profile Recent Labs  Lab 02/12/18 2353  INR 0.87   ------------------------------------------------------------------------------------------------------------------- No results  for input(s): DDIMER in the last 72 hours. -------------------------------------------------------------------------------------------------------------------  Cardiac Enzymes No results for input(s): CKMB, TROPONINI, MYOGLOBIN in the last 168 hours.  Invalid input(s): CK ------------------------------------------------------------------------------------------------------------------ Invalid input(s): POCBNP  ---------------------------------------------------------------------------------------------------------------  Urinalysis    Component Value Date/Time   COLORURINE YELLOW 12/11/2015 1948   APPEARANCEUR CLEAR 12/11/2015 1948   APPEARANCEUR Clear 08/21/2013 1631   LABSPEC 1.024 12/11/2015 1948   LABSPEC 1.033 08/21/2013 1631   PHURINE 6.5 12/11/2015 1948   GLUCOSEU >1000 (A) 12/11/2015 1948   GLUCOSEU >=500 08/21/2013 1631   HGBUR MODERATE (A) 12/11/2015 1948   BILIRUBINUR NEGATIVE 12/11/2015 1948   BILIRUBINUR Negative 08/21/2013 1631   KETONESUR 15 (A) 12/11/2015 1948   PROTEINUR 100 (A) 12/11/2015 1948   NITRITE NEGATIVE 12/11/2015 1948   LEUKOCYTESUR NEGATIVE 12/11/2015 1948   LEUKOCYTESUR Negative 08/21/2013 1631     RADIOLOGY: Dg Chest Port 1 View  Result Date: 02/13/2018 CLINICAL DATA:  Onset of weakness and dizziness EXAM: PORTABLE CHEST 1 VIEW COMPARISON:  05/14/2017 FINDINGS: The heart size and mediastinal contours are within normal limits. Both lungs are clear. The visualized skeletal structures are unremarkable. IMPRESSION: No active disease. Electronically Signed   By: Tollie Eth M.D.   On: 02/13/2018 00:22    EKG: Orders placed or performed during the hospital encounter of 05/14/17  . ED EKG within 10 minutes  . ED EKG within 10 minutes  . EKG 12-Lead  . EKG 12-Lead  . EKG    IMPRESSION AND PLAN:  1.  Hematemesis.  This could be related to esophageal varices, bleeding ulcer, or Mallory-Weiss tear.  We will keep patient n.p.o. for now.  Will  start PPI treatment with Protonix.  We will start IV fluids.  Will monitor hemoglobin level closely.  Will hold any anticoagulants.  Will add ceftriaxone IV, in case the bleeding is from esophageal varices.  GI is consulted for further evaluation and treatment. 2.  Acute renal failure, likely prerenal, due to GI bleed and poor p.o. intake.  Level is elevated 2.5.  We will start IV fluids and continue to monitor kidney function closely.  Avoid nephrotoxic medications. 3.  Elevated liver function test.  There is high suspicion for alcoholic liver cirrhosis.  Will check liver ultrasound. 4.  Alcohol abuse and dependence.  We will start patient on CIWA protocol, which includes vitamins replacement as well.  I had a long discussion with the patient about all the complications from alcoholism and I encouraged him to quit alcohol use. 6.  Hyponatremia, likely in the setting of hypovolemia.  We will start IV fluids of normal saline and continue to monitor sodium level closely. 7.  Hypokalemia, will replace potassium per protocol. 8.  Diabetes type 2.  Will monitor blood sugars before meals and at bedtime and use insulin treatment during the hospital stay. 9.  CAD, stable continue medical management, except for aspirin and Plavix. 10.  Hypotension, due to hypovolemia  from GI blood loss and poor p.o. Intake.  We will start IV fluids and hold BP meds.  Continue to monitor closely.   All the records are reviewed and case discussed with ED provider. Management plans discussed with the patient, and he is in agreement.  CODE STATUS: FULL    Code Status Orders  (From admission, onward)        Start     Ordered   02/13/18 0223  Full code  Continuous     02/13/18 0222    Code Status History    This patient has a current code status but no historical code status.       TOTAL TIME TAKING CARE OF THIS PATIENT: 45 minutes.    Cammy Copa M.D on 02/13/2018 at 4:02 AM  Between 7am to 6pm - Pager -  [phone]  After 6pm go to [email or web address] - password [email or web address] [facility]  [facility]  Office  [phone]  CC: Primary care physician; [facility]

## 2018-02-13 NOTE — Consult Note (Signed)
Franklin Minium, MD [facility]  [address] [place], [place] [address] Phone: [phone] Fax : [phone]  Consultation  Referring Provider:     Dr. Renae Gloss Primary Care Physician:  [facility] Primary Gastroenterologist: Gentry Fitz         Reason for Consultation:     Hematemesis  Date of Admission:  02/12/2018 Date of Consultation:  02/13/2018         HPI:   Franklin Douglas is a 47 y.o. male who has a history of recurrent nausea and vomiting with an admission to [facility] ER last year for the same symptoms.  The Douglas states that he has been drinking for many years but much more heavily in the last 3 years.  The Douglas states that he drinks approximately 6 whiskeys per day and was trying to cut back on Friday which resulted in him having a lot of vomiting.  The Douglas states he felt better on Saturday until up approximately 10:00 at night whereupon he started vomiting again.  The Douglas states that with the vomiting his vomitus had blood in it.  He reported to be bright red blood.  The Douglas denies any black stools or bloody stools.  He also denies any recurrence of the vomiting today.  The Douglas states he is actually hungry.  There is no report of any nausea.  The Douglas has not seen a liver specialist or a gastroneurologist in the past except for having a colonoscopy due to diverticulitis.  He reports that that was done in [place].  The Douglas's hemoglobin on admission was 16 and the repeat this morning was 14.3.  Past Medical History:  Diagnosis Date  . Diabetes mellitus without complication (HCC)   . Diabetic neuropathy (HCC)   . Diverticula, colon   . Diverticulitis   . Sciatica     Past Surgical History:  Procedure Laterality Date  . CARDIAC SURGERY    . COLON SURGERY    . HERNIA REPAIR      Prior to Admission medications   Medication Sig Start Date End Date Taking? Authorizing Provider  aspirin EC 81 MG tablet Take 81 mg by mouth daily.   Yes  [provider]  atorvastatin (LIPITOR) 80 MG tablet Take 80 mg by mouth every evening.  05/27/16 02/13/18 Yes [provider]  carvedilol (COREG) 12.5 MG tablet Take 12.5 mg by mouth 2 (two) times daily with a meal.    Yes [provider]  fluticasone (FLONASE) 50 MCG/ACT nasal spray Place 2 sprays into both nostrils daily.   Yes [provider]  insulin aspart (NOVOLOG) 100 UNIT/ML injection Inject 3-6 Units into the skin See admin instructions. Sliding scale 130-160=1unit, 161-190=2units, 191-220=3units...Marland KitchenMarland KitchenMarland Kitchenevery 30points for BS reading equal 1 unit 08/25/16  Yes [provider]  lisinopril (PRINIVIL,ZESTRIL) 5 MG tablet TAKE  BY MOUTH ONCE DAILY in the evening 06/18/16  Yes [provider]  pseudoephedrine (SUDAFED) 30 MG tablet Take 30 mg by mouth every 4 (four) hours as needed for congestion.   Yes [provider]  albuterol (PROVENTIL HFA;VENTOLIN HFA) 108 (90 BASE) MCG/ACT inhaler Inhale 1-2 puffs into the lungs every 6 (six) hours as needed for wheezing or shortness of breath. Douglas not taking: Reported on 02/13/2018 02/27/15   Franklin Mccallum, MD  benzonatate (TESSALON) 200 MG capsule Take 1 capsule (200 mg total) by mouth 3 (three) times daily as needed for cough. Douglas not taking: Reported on 06/22/2016 02/27/15   Franklin Mccallum,  MD  chlordiazePOXIDE (LIBRIUM) 25 MG capsule  PO TID x 1D, then 25-50mg  PO BID X 1D, then 25-50mg  PO QD X 1D Douglas not taking: Reported on 06/22/2016 12/11/15   Pricilla Loveless, MD  cyclobenzaprine (FLEXERIL) 10 MG tablet Take 1 tablet (10 mg total) by mouth 2 (two) times daily as needed for muscle spasms. Do not drive while taking as can cause drowsiness. Douglas not taking: Reported on 05/14/2017 02/08/17   Renford Dills, NP  HYDROcodone-acetaminophen (NORCO/VICODIN) 5-325 MG tablet 1-2 tabs po bid prn Douglas not taking: Reported on 05/14/2017 02/10/17   Franklin Mccallum, MD  insulin detemir  (LEVEMIR) 100 UNIT/ML injection Inject 50 Units into the skin at bedtime.     [provider]  loratadine (CLARITIN) 10 MG tablet Take 10 mg by mouth daily.    [provider]  meloxicam (MOBIC) 15 MG tablet Take 1 tablet (15 mg total) by mouth daily as needed. Douglas not taking: Reported on 05/14/2017 02/08/17   Renford Dills, NP  spironolactone (ALDACTONE) 25 MG tablet Take 12.5 mg by mouth. 05/01/16   [provider]  triamcinolone cream (KENALOG) 0.1 % Apply 1 application topically 2 (two) times daily. Douglas not taking: Reported on 02/13/2018 01/30/16   Lutricia Feil, PA-C    Family History  Problem Relation Age of Onset  . Diabetes Mother   . Diabetes Brother      Social History   Tobacco Use  . Smoking status: Never Smoker  . Smokeless tobacco: Never Used  Substance Use Topics  . Alcohol use: Yes    Alcohol/week: 12.0 oz    Types: 20 Shots of liquor per week    Comment: 8 drinks/ day  . Drug use: No    Allergies as of 02/12/2018 - Review Complete 02/12/2018  Allergen Reaction Noted  . Sulfa antibiotics Hives, Shortness Of Breath, Rash, and Anaphylaxis 02/02/2013    Review of Systems:    All systems reviewed and negative except where noted in HPI.   Physical Exam:  Vital signs in last 24 hours: Temp:  [97.7 F (36.5 C)-98.5 F (36.9 C)] 98.5 F (36.9 C) (05/12 0223) Pulse Rate:  [108-127] 115 (05/12 0223) Resp:  [18-22] 18 (05/12 0223) BP: (82-135)/(50-83) 113/81 (05/12 0223) SpO2:  [95 %-100 %] 100 % (05/12 0223) Weight:  [208 lb 8.9 oz (94.6 kg)-215 lb (97.5 kg)] 208 lb 8.9 oz (94.6 kg) (05/12 0223) Last BM Date: 02/12/18 General:   Pleasant, cooperative in NAD Head:  Normocephalic and atraumatic. Eyes:   No icterus.   Conjunctiva pink. PERRLA. Ears:  Normal auditory acuity. Neck:  Supple; no masses or thyroidomegaly Lungs: Respirations even and unlabored. Lungs clear to auscultation bilaterally.   No wheezes, crackles, or  rhonchi.  Heart:  Regular rate and rhythm;  Without murmur, clicks, rubs or gallops Abdomen:  Soft, nondistended, nontender. Normal bowel sounds. No appreciable masses or hepatomegaly.  No rebound or guarding.  Rectal:  Not performed. Msk:  Symmetrical without gross deformities.    Extremities:  Without edema, cyanosis or clubbing. Neurologic:  Alert and oriented x3;  grossly normal neurologically. Skin:  Intact without significant lesions or rashes. Cervical Nodes:  No significant cervical adenopathy. Psych:  Alert and cooperative. Normal affect.  LAB RESULTS: Recent Labs    02/12/18 2353 02/13/18 0509  WBC 7.0 6.2  HGB 16.0 14.3  HCT 45.5 40.9  PLT 197 179   BMET Recent Labs    02/12/18 2353 02/13/18 0509  NA 132* 133*  K 3.2* 3.1*  CL 89* 98*  CO2 24 26  GLUCOSE 99 64*  BUN 56* 50*  CREATININE 2.51* 1.63*  CALCIUM 9.7 8.7*   LFT Recent Labs    02/12/18 2353  PROT 8.8*  ALBUMIN 5.3*  AST 51*  ALT 47  ALKPHOS 77  BILITOT 0.9   PT/INR Recent Labs    02/12/18 2353  LABPROT 11.8  INR 0.87    STUDIES: Dg Chest Port 1 View  Result Date: 02/13/2018 CLINICAL DATA:  Onset of weakness and dizziness EXAM: PORTABLE CHEST 1 VIEW COMPARISON:  05/14/2017 FINDINGS: The heart size and mediastinal contours are within normal limits. Both lungs are clear. The visualized skeletal structures are unremarkable. IMPRESSION: No active disease. Electronically Signed   By: Tollie Eth M.D.   On: 02/13/2018 00:22      Impression / Plan:   Assessment: Active Problems:   Hematemesis Alcohol abuse  CHOZEN LATULIPPE is a 47 y.o. y/o male with a long history of alcohol abuse and hematemesis yesterday.  The Douglas has had no further episodes of vomiting blood in his hemoglobin is stable.  The Douglas now reports that he would like to start eating.  There is been no further vomiting overnight.  Plan:  The Douglas will be started on a clear liquid diet and advance as tolerated.   The Douglas's hemoglobin and lack of black stools indicate that this Douglas probably had a Mallory-Weiss tear versus alcoholic gastritis.  It is unlikely that the Douglas has a bleeding peptic ulcer.  The Douglas has been told to seek treatment for alcohol abuse.  Is also been told to abstain from any further drinking.  Nothing further to do from a GI point of view while this Douglas is in the hospital.   Thank you for involving me in the care of this Douglas.      LOS: 0 days   Franklin Minium, MD  02/13/2018, 9:11 AM    Note: This dictation was prepared with Dragon dictation along with smaller phrase technology. Any transcriptional errors that result from this process are unintentional.

## 2018-02-13 NOTE — Discharge Summary (Signed)
[facility] [place] [facility]   PATIENT NAME: Franklin Douglas    MR#:  [id number]  DATE OF BIRTH:  08-Nov-1970  DATE OF ADMISSION:  02/12/2018 ADMITTING PHYSICIAN: Cammy Copa, MD  DATE OF signing out AGAINST MEDICAL ADVICE: 02/13/2018  PRIMARY CARE PHYSICIAN: [facility]    ADMISSION DIAGNOSIS:  Dehydration [E86.0] Acute kidney injury (HCC) [N17.9] Alcohol withdrawal syndrome with complication (HCC) [F10.239] Hematemesis with nausea [K92.0]  DISCHARGE DIAGNOSIS:  Active Problems:   Hematemesis   SECONDARY DIAGNOSIS:   Past Medical History:  Diagnosis Date  . Diabetes mellitus without complication (HCC)   . Diabetic neuropathy (HCC)   . Diverticula, colon   . Diverticulitis   . Sciatica     HOSPITAL COURSE:   1.  Hematemesis.  Patient was seen by gastroenterology and they decided to watch things at this point.  Patient takes aspirin and Plavix at home.  I advised him to stop these medications. 2.  Acute kidney injury.  I would like to watch the patient another day with IV fluid hydration. 3.  Hypokalemia.  Replaced potassium and IV fluids.  Level still low upon signing out AMA.  I would have like to watch the potassium another day. 4.  History of hypertension.  On lisinopril.  This medication was held. 5.  Type 2 diabetes mellitus.  All sugars were good except for the last sugar.  Patient on Levemir and sliding scale at home. 6.  Alcohol abuse.  Patient interested in stopping drinking.  Currently not in any signs of withdrawal at this point.  DISCHARGE CONDITIONS:   Patient signed out AMA  CONSULTS OBTAINED:  Treatment Team:  [name], MD  DRUG ALLERGIES:   Allergies  Allergen Reactions  . Sulfa Antibiotics Hives, Shortness Of Breath, Rash and Anaphylaxis    DISCHARGE MEDICATIONS:   Allergies as of 02/13/2018      Reactions   Sulfa Antibiotics Hives, Shortness Of Breath, Rash, Anaphylaxis      Medication List    STOP  taking these medications   albuterol 108 (90 Base) MCG/ACT inhaler Commonly known as:  PROVENTIL HFA;VENTOLIN HFA   aspirin EC 81 MG tablet   atorvastatin 80 MG tablet Commonly known as:  LIPITOR   benzonatate 200 MG capsule Commonly known as:  TESSALON   carvedilol 12.5 MG tablet Commonly known as:  COREG   chlordiazePOXIDE 25 MG capsule Commonly known as:  LIBRIUM   cyclobenzaprine 10 MG tablet Commonly known as:  FLEXERIL   HYDROcodone-acetaminophen 5-325 MG tablet Commonly known as:  NORCO/VICODIN   lisinopril 5 MG tablet Commonly known as:  PRINIVIL,ZESTRIL   meloxicam 15 MG tablet Commonly known as:  MOBIC   pseudoephedrine 30 MG tablet Commonly known as:  SUDAFED   spironolactone 25 MG tablet Commonly known as:  ALDACTONE   triamcinolone cream 0.1 % Commonly known as:  KENALOG     TAKE these medications   fluticasone 50 MCG/ACT nasal spray Commonly known as:  FLONASE Place 2 sprays into both nostrils daily.   insulin detemir 100 UNIT/ML injection Commonly known as:  LEVEMIR Inject 50 Units into the skin at bedtime.   loratadine 10 MG tablet Commonly known as:  CLARITIN Take 10 mg by mouth daily.   NOVOLOG 100 UNIT/ML injection Generic drug:  insulin aspart Inject 3-6 Units into the skin See admin instructions. Sliding scale 130-160=1unit, 161-190=2units, 191-220=3units...Marland KitchenMarland KitchenMarland Kitchenevery 30points for BS reading equal 1 unit        DISCHARGE INSTRUCTIONS:  Patient signed out AGAINST MEDICAL ADVICE  If you experience worsening of your admission symptoms, develop shortness of breath, life threatening emergency, suicidal or homicidal thoughts you must seek medical attention immediately by calling 911 or calling your MD immediately  if symptoms less severe.   Today   CHIEF COMPLAINT:   Chief Complaint  Patient presents with  . Hematemesis    HISTORY OF PRESENT ILLNESS:  Franklin Douglas  is a 47 y.o. male came in with hematemesis   VITAL  SIGNS:  Blood pressure 138/87, pulse (!) 101, temperature 98.3 F (36.8 C), temperature source Oral, resp. rate 18, height  (1.803 m), weight 94.6 kg (208 lb 8.9 oz), SpO2 100 %.    PHYSICAL EXAMINATION:  GENERAL:  47 y.o.-year-old patient lying in the bed with no acute distress.  EYES: Pupils equal, round, reactive to light and accommodation. No scleral icterus. Extraocular muscles intact.  HEENT: Head atraumatic, normocephalic. Oropharynx and nasopharynx clear.  NECK:  Supple, no jugular venous distention. No thyroid enlargement, no tenderness.  LUNGS: Normal breath sounds bilaterally, no wheezing, rales,rhonchi or crepitation. No use of accessory muscles of respiration.  CARDIOVASCULAR: S1, S2 normal. No murmurs, rubs, or gallops.  ABDOMEN: Soft, non-tender, non-distended. Bowel sounds present. No organomegaly or mass.  EXTREMITIES: No pedal edema, cyanosis, or clubbing.  NEUROLOGIC: Cranial nerves II through XII are intact. Muscle strength 5/5 in all extremities. Sensation intact. Gait not checked.  PSYCHIATRIC: The patient is alert and oriented x 3.  SKIN: No obvious rash, lesion, or ulcer.   DATA REVIEW:   CBC Recent Labs  Lab 02/13/18 0509  WBC 6.2  HGB 14.3  HCT 40.9  PLT 179    Chemistries  Recent Labs  Lab 02/12/18 2353 02/13/18 0509  NA 132* 133*  K 3.2* 3.1*  CL 89* 98*  CO2 24 26  GLUCOSE 99 64*  BUN 56* 50*  CREATININE 2.51* 1.63*  CALCIUM 9.7 8.7*  MG  --  2.3  AST 51*  --   ALT 47  --   ALKPHOS 77  --   BILITOT 0.9  --     RADIOLOGY:  Dg Chest Port 1 View  Result Date: 02/13/2018 CLINICAL DATA:  Onset of weakness and dizziness EXAM: PORTABLE CHEST 1 VIEW COMPARISON:  05/14/2017 FINDINGS: The heart size and mediastinal contours are within normal limits. Both lungs are clear. The visualized skeletal structures are unremarkable. IMPRESSION: No active disease. Electronically Signed   By: Tollie Eth M.D.   On: 02/13/2018 00:22   US Abdomen  Limited Ruq  Result Date: 02/13/2018 CLINICAL DATA:  47 year old male with a history of transaminitis EXAM: ULTRASOUND ABDOMEN LIMITED RIGHT UPPER QUADRANT COMPARISON:  None. FINDINGS: Gallbladder: No gallstones or wall thickening visualized. No sonographic Murphy sign noted by sonographer. Common bile duct: Diameter: 4 mm-5 mm Liver: No nodularity. Mildly coarsened echotexture of the liver parenchyma. Portal vein is patent on color Doppler imaging with normal direction of blood flow towards the liver. IMPRESSION: No cholelithiasis. Increased echogenicity of liver parenchyma is nonspecific, potentially representing steatosis or other chronic medical liver disease. Electronically Signed   By: Gilmer Mor D.O.   On: 02/13/2018 09:26      Management plans discussed with the patient, and I thought he was in agreement until he signed out AGAINST MEDICAL ADVICE  CODE STATUS:     Code Status Orders  (From admission, onward)        Start     Ordered  02/13/18 0223  Full code  Continuous     02/13/18 0222    Code Status History    This patient has a current code status but no historical code status.      TOTAL TIME TAKING CARE OF THIS PATIENT: 32 minutes.    Alford Highland M.D on 02/13/2018 at 4:06 PM  Between 7am to 6pm - Pager - [phone]  After 6pm go to [email or web address] - Social research officer, government  [employer] [facility] Office  [phone]  CC: Primary care physician; [facility]

## 2018-02-14 LAB — HIV ANTIBODY (ROUTINE TESTING W REFLEX): HIV SCREEN 4TH GENERATION: NONREACTIVE

## 2018-02-14 NOTE — Progress Notes (Signed)
Pt was given instructions that Dr. Renae Gloss wanted him to stay one more day to see another creatinine due to dehydration. He was not willing to stay. I advised him by leaving AMA his bill would be his responsible.

## 2018-03-18 ENCOUNTER — Other Ambulatory Visit: Payer: Self-pay

## 2018-03-18 ENCOUNTER — Ambulatory Visit
Admission: EM | Admit: 2018-03-18 | Discharge: 2018-03-18 | Disposition: A | Discharge status: Home / Self Care | Payer: Managed Care, Other (non HMO) | Attending: Registered Nurse | Admitting: Registered Nurse

## 2018-03-18 ENCOUNTER — Encounter: Payer: Self-pay | Admitting: Emergency Medicine

## 2018-03-18 DIAGNOSIS — J069 Acute upper respiratory infection, unspecified: Secondary | ICD-10-CM | POA: Diagnosis not present

## 2018-03-18 DIAGNOSIS — B9789 Other viral agents as the cause of diseases classified elsewhere: Secondary | ICD-10-CM

## 2018-03-18 MED ORDER — BENZONATATE 200 MG PO CAPS: 200.0000 mg | ORAL_CAPSULE | Freq: Three times a day (TID) | ORAL | 0 refills | Status: AC | PRN

## 2018-03-18 MED ORDER — FEXOFENADINE HCL 180 MG PO TABS: 180.0000 mg | ORAL_TABLET | Freq: Every day | ORAL | 0 refills | Status: DC

## 2018-03-18 MED ORDER — PSEUDOEPHEDRINE HCL ER 120 MG PO TB12: 120.0000 mg | ORAL_TABLET | Freq: Two times a day (BID) | ORAL | Status: DC

## 2018-03-18 MED ORDER — SALINE SPRAY 0.65 % NA SOLN: 2.0000 | NASAL | 0 refills | Status: DC

## 2018-03-18 MED ORDER — ALBUTEROL SULFATE HFA 108 (90 BASE) MCG/ACT IN AERS: 1.0000 | INHALATION_SPRAY | RESPIRATORY_TRACT | 0 refills | Status: DC | PRN

## 2018-03-18 MED ORDER — FLUTICASONE PROPIONATE 50 MCG/ACT NA SUSP: 1.0000 | Freq: Two times a day (BID) | NASAL | 0 refills | Status: DC

## 2018-03-18 NOTE — ED Triage Notes (Signed)
Patient c/o cough and chest congestion for couple weeks.  Patient denies fevers.

## 2018-03-18 NOTE — ED Provider Notes (Signed)
[facility]    CSN: [id number] Arrival date & time: 03/18/18  1478     History   Chief Complaint Chief Complaint  Patient presents with  . Cough    HPI Franklin Douglas is a 47 y.o. male.   47y/o caucasian male established patient with nasal congestion, cough, sore throat, intermittent left ear pain, runny nose for two weeks.  Has tried Nyquil, Sudafed 2- 12 hr tabs and flonase prn; denied use of saline, tylenol, motrin.  Work [employer] Others sick at work and girlfriend sick similar symptoms.  Has used claritin and allegra sometimes both in one day for PMHx seasonal allergies claritin or allegra or both Yellow to green phlegm a couple days ago now clear; intermittent cough usually worse am and bedtime.  Has inhalers at home from previous illness not using.     Past Medical History:  Diagnosis Date  . Diabetes mellitus without complication (HCC)   . Diabetic neuropathy (HCC)   . Diverticula, colon   . Diverticulitis   . Sciatica     Patient Active Problem List   Diagnosis Date Noted  . Hematemesis 02/13/2018  . AA (alcohol abuse) 12/02/2015  . 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 Diabetes mellitus (HCC) 05/12/2013  . BP (high blood pressure) 05/12/2013  . Pure hypercholesterolemia 05/12/2013    Past Surgical History:  Procedure Laterality Date  . CARDIAC SURGERY    . COLON SURGERY    . HERNIA REPAIR         Home Medications    Prior to Admission medications   Medication Sig Start Date End Date Taking? Authorizing Provider  aspirin EC 81 MG tablet Take by mouth.   Yes [provider]  clopidogrel (PLAVIX) 75 MG tablet Take 75 mg by mouth daily. 01/12/18  Yes [provider]  insulin aspart (NOVOLOG) 100 UNIT/ML injection Inject 3-6 Units into the skin See admin instructions. Sliding scale 130-160=1unit, 161-190=2units, 191-220=3units...Marland KitchenMarland KitchenMarland Kitchenevery 30points for BS reading equal 1 unit 08/25/16  Yes [provider]  insulin detemir (LEVEMIR) 100 UNIT/ML injection Inject 50 Units into the skin at bedtime.    Yes [provider]  lisinopril (PRINIVIL,ZESTRIL) 10 MG tablet Take by mouth. 09/06/17 09/06/18 Yes [provider]  albuterol (PROVENTIL HFA;VENTOLIN HFA) 108 (90 Base) MCG/ACT inhaler Inhale 1-2 puffs into the lungs every 4 (four) hours as needed for wheezing or shortness of breath. 03/18/18   Allissa Albright, Jarold Song, NP  benzonatate (TESSALON) 200 MG capsule Take 1 capsule (200 mg total) by mouth 3 (three) times daily as needed for up to 10 days for cough. 03/18/18 03/28/18  Kaysea Raya, Jarold Song, NP  fexofenadine (ALLEGRA ALLERGY) 180 MG tablet Take 1 tablet (180 mg total) by mouth daily. 03/18/18 04/17/18  Addelynn Batte, Jarold Song, NP  fluticasone (FLONASE) 50 MCG/ACT nasal spray Place 1 spray into both nostrils 2 (two) times daily. 03/18/18 05/17/18  Jovon Streetman, Jarold Song, NP  pseudoephedrine (SUDAFED 12 HOUR) 120 MG 12 hr tablet Take 1 tablet (120 mg total) by mouth 2 (two) times daily. 03/18/18   Kimmie Berggren, Jarold Song, NP  sodium chloride (OCEAN) 0.65 % SOLN nasal spray Place 2 sprays into both nostrils every 2 (two) hours while awake. 03/18/18 04/17/18  Rolonda Pontarelli, Jarold Song, NP    Family History Family History  Problem Relation Age of Onset  . Diabetes Mother   . Diabetes Brother     Social History Social History   Tobacco Use  . Smoking status: Never  Smoker  . Smokeless tobacco: Never Used  Substance Use Topics  . Alcohol use: Yes    Alcohol/week: 12.0 oz    Types: 20 Shots of liquor per week    Comment: 8 drinks/ day  . Drug use: No     Allergies   Sulfa antibiotics   Review of Systems Review of Systems  Constitutional: Negative for activity change, appetite change, chills, diaphoresis, fatigue, fever and unexpected weight change.  HENT: Positive for congestion, ear pain, postnasal drip, rhinorrhea and sore throat. Negative for dental problem, drooling, ear discharge,  facial swelling, hearing loss, mouth sores, nosebleeds, sinus pressure, sinus pain, sneezing, tinnitus, trouble swallowing and voice change.   Eyes: Negative for photophobia, pain, discharge, redness, itching and visual disturbance.  Respiratory: Positive for cough. Negative for choking, chest tightness, shortness of breath, wheezing and stridor.   Cardiovascular: Negative for chest pain, palpitations and leg swelling.  Gastrointestinal: Negative for abdominal distention, abdominal pain, blood in stool, constipation, diarrhea, nausea and vomiting.  Endocrine: Negative for cold intolerance and heat intolerance.  Genitourinary: Negative for dysuria.  Musculoskeletal: Negative for arthralgias, back pain, gait problem, joint swelling, myalgias, neck pain and neck stiffness.  Skin: Negative for color change, pallor, rash and wound.  Allergic/Immunologic: Positive for environmental allergies. Negative for food allergies and immunocompromised state.  Neurological: Negative for dizziness, tremors, seizures, syncope, facial asymmetry, speech difficulty, weakness, light-headedness, numbness and headaches.  Hematological: Negative for adenopathy. Does not bruise/bleed easily.  Psychiatric/Behavioral: Negative for agitation, confusion and sleep disturbance.     Physical Exam Triage Vital Signs ED Triage Vitals  Enc Vitals Group     BP 03/18/18 0827 (!) 155/97     Pulse Rate 03/18/18 0827 (!) 112     Resp 03/18/18 0827 16     Temp 03/18/18 0827 98.2 F (36.8 C)     Temp Source 03/18/18 0827 Oral     SpO2 03/18/18 0827 98 %     Weight 03/18/18 0824 215 lb (97.5 kg)     Height 03/18/18 0824 5\' 11"  (1.803 m)     Head Circumference --      Peak Flow --      Pain Score 03/18/18 0824 0     Pain Loc --      Pain Edu? --      Excl. in GC? --    No data found.  Updated Vital Signs BP (!) 155/97 (BP Location: Left Arm)   Pulse (!) 112   Temp 98.2 F (36.8 C) (Oral)   Resp 16   Ht 5\' 11"  (1.803  m)   Wt 215 lb (97.5 kg)   SpO2 98%   BMI 29.99 kg/m   Physical Exam  Constitutional: He is oriented to person, place, and time. Vital signs are normal. He appears well-developed and well-nourished. He is active and cooperative.  Non-toxic appearance. He does not have a sickly appearance. He appears ill. No distress.  HENT:  Head: Normocephalic and atraumatic.  Right Ear: External ear and ear canal normal. A middle ear effusion is present.  Left Ear: Hearing, external ear and ear canal normal. A middle ear effusion is present.  Nose: Mucosal edema and rhinorrhea present. No nose lacerations, sinus tenderness, nasal deformity, septal deviation or nasal septal hematoma. No epistaxis.  No foreign bodies. Right sinus exhibits maxillary sinus tenderness. Right sinus exhibits no frontal sinus tenderness. Left sinus exhibits maxillary sinus tenderness. Left sinus exhibits no frontal sinus tenderness.  Mouth/Throat: Uvula is midline  and mucous membranes are normal. Mucous membranes are not pale, not dry and not cyanotic. He does not have dentures. No oral lesions. No trismus in the jaw. Normal dentition. No dental abscesses, uvula swelling, lacerations or dental caries. Posterior oropharyngeal edema and posterior oropharyngeal erythema present. No oropharyngeal exudate or tonsillar abscesses.  Bilateral TMs air fluid level clear; right TM vasculature inflamed; cobblestoning posterior pharynx; bilateral allergic shiners; clear discharge bilateral nares  Eyes: Pupils are equal, round, and reactive to light. Conjunctivae, EOM and lids are normal. Right eye exhibits no chemosis, no discharge, no exudate and no hordeolum. No foreign body present in the right eye. Left eye exhibits no chemosis, no discharge, no exudate and no hordeolum. No foreign body present in the left eye. Right conjunctiva is not injected. Right conjunctiva has no hemorrhage. Left conjunctiva is not injected. Left conjunctiva has no  hemorrhage. No scleral icterus. Right eye exhibits normal extraocular motion and no nystagmus. Left eye exhibits normal extraocular motion and no nystagmus. Right pupil is round and reactive. Left pupil is round and reactive. Pupils are equal.  Neck: Trachea normal, normal range of motion and phonation normal. Neck supple. No tracheal tenderness, no spinous process tenderness and no muscular tenderness present. No neck rigidity. No tracheal deviation, no edema, no erythema and normal range of motion present. No thyroid mass and no thyromegaly present.  Cardiovascular: Regular rhythm, S1 normal, S2 normal, normal heart sounds and intact distal pulses. Tachycardia present. PMI is not displaced. Exam reveals no gallop and no friction rub.  No murmur heard. Pulmonary/Chest: Effort normal and breath sounds normal. No stridor. No respiratory distress. He has no decreased breath sounds. He has no wheezes. He has no rhonchi. He has no rales.  No cough observed in exam room; spoke full sentences without difficulty  Abdominal: Soft. Normal appearance. He exhibits no distension.  Musculoskeletal: Normal range of motion. He exhibits no edema or tenderness.       Right shoulder: Normal.       Left shoulder: Normal.       Right elbow: Normal.      Left elbow: Normal.       Right hip: Normal.       Left hip: Normal.       Right knee: Normal.       Left knee: Normal.       Cervical back: Normal.       Thoracic back: Normal.       Lumbar back: Normal.       Right hand: Normal.       Left hand: Normal.  Lymphadenopathy:       Head (right side): No submental, no submandibular, no tonsillar, no preauricular, no posterior auricular and no occipital adenopathy present.       Head (left side): No submental, no submandibular, no tonsillar, no preauricular, no posterior auricular and no occipital adenopathy present.    He has no cervical adenopathy.       Right cervical: No superficial cervical, no deep cervical and  no posterior cervical adenopathy present.      Left cervical: No superficial cervical, no deep cervical and no posterior cervical adenopathy present.  Neurological: He is alert and oriented to person, place, and time. He has normal strength. He is not disoriented. He displays no atrophy and no tremor. No cranial nerve deficit or sensory deficit. He exhibits normal muscle tone. He displays no seizure activity. Coordination and gait normal. GCS eye subscore is 4.  GCS verbal subscore is 5. GCS motor subscore is 6.  On/off exam table without difficulty; gait sure and steady in hallway  Skin: Skin is warm, dry and intact. Capillary refill takes less than 2 seconds. No abrasion, no bruising, no burn, no ecchymosis, no laceration, no lesion, no petechiae and no rash noted. He is not diaphoretic. No cyanosis or erythema. No pallor. Nails show no clubbing.  Psychiatric: He has a normal mood and affect. His speech is normal and behavior is normal. Judgment and thought content normal. He is not actively hallucinating. Cognition and memory are normal. He is attentive.  Nursing note and vitals reviewed.    UC Treatments / Results  Labs (all labs ordered are listed, but only abnormal results are displayed) Labs Reviewed - No data to display  EKG None  Radiology No results found.  Procedures Procedures (including critical care time)  Medications Ordered in UC Medications - No data to display  Initial Impression / Assessment and Plan / UC Course  I have reviewed the triage vital signs and the nursing notes.  Pertinent labs & imaging results that were available during my care of the patient were reviewed by me and considered in my medical decision making (see chart for details).    Patient may use normal saline nasal spray 2 sprays each nostril q2h wa as needed. flonase 1 spray each nostril BID #1 RF0.Patient denied personal or family history of ENT cancer.  Discussed not to take allegra and claritin  recommend allegra 180mg  po daily as longer lasting/half life.  May continue sudafed 120mg  po BID max 240 mg per 24 hours.  Tessalon pearles 200mg  po TID prn cough #30 Rf0 electronic Rx to his pharmacy of choice.  Has albuterol inhaler at home reviewed use e.g. If chest tightness, recurrent cough, wheezing 1-2 puffs po q4-6h.  Consistent use of his nose sprays and allergy medications and am/qhs shower while ill.  Avoid sleeping with windows open.   Avoid triggers if possible.  Shower prior to bedtime if exposed to triggers.  If allergic dust/dust mites recommend mattress/pillow covers/encasements; washing linens, vacuuming, sweeping, dusting weekly.  Call or return to clinic as needed if these symptoms worsen or fail to improve as anticipated.   Exitcare handout on viral uri, how to use inhaler and sinus rinse given to patient.  Patient verbalized understanding of instructions, agreed with plan of care and had no further questions at this time.  P2:  Avoidance and hand washing.  Supportive treatment.   No evidence of invasive bacterial infection, non toxic and well hydrated.  This is most likely self limiting viral infection.  I do not see where any further testing or imaging is necessary at this time.   I will suggest supportive care, rest, good hygiene and encourage the patient to take adequate fluids.  The patient is to return to clinic or EMERGENCY ROOM if symptoms worsen or change significantly e.g. ear pain, fever, purulent discharge from ears or bleeding.  Exitcare handout on eustachian tube dysfunction given to patient.  Patient verbalized agreement and understanding of treatment plan.    Final Clinical Impressions(s) / UC Diagnoses   Final diagnoses:  Viral URI with cough   Discharge Instructions   None    ED Prescriptions    Medication Sig Dispense Auth. Provider   fluticasone (FLONASE) 50 MCG/ACT nasal spray Place 1 spray into both nostrils 2 (two) times daily. 16 g Boniface Goffe A, NP     sodium chloride (  OCEAN) 0.65 % SOLN nasal spray Place 2 sprays into both nostrils every 2 (two) hours while awake.  Damyn Weitzel, Jarold Song, NP   fexofenadine (ALLEGRA ALLERGY) 180 MG tablet Take 1 tablet (180 mg total) by mouth daily. 30 tablet Darrio Bade A, NP   pseudoephedrine (SUDAFED 12 HOUR) 120 MG 12 hr tablet Take 1 tablet (120 mg total) by mouth 2 (two) times daily.  Yurianna Tusing, Jarold Song, NP   benzonatate (TESSALON) 200 MG capsule Take 1 capsule (200 mg total) by mouth 3 (three) times daily as needed for up to 10 days for cough. 30 capsule Ashton Sabine A, NP   albuterol (PROVENTIL HFA;VENTOLIN HFA) 108 (90 Base) MCG/ACT inhaler Inhale 1-2 puffs into the lungs every 4 (four) hours as needed for wheezing or shortness of breath. 1 Inhaler Ruchama Kubicek, Jarold Song, NP     Controlled Substance Prescriptions [place] Controlled Substance Registry consulted? Yes, I have consulted the [place] Controlled Substances Registry for this patient, and feel the risk/benefit ratio today is favorable for proceeding with this prescription for a controlled substance.   Barbaraann Barthel, NP 03/18/18 1333

## 2018-05-31 IMAGING — US US ABDOMEN LIMITED
1 series · 14 of 25 positions shown · non-contrast
Comparison: None.

CLINICAL DATA: 46-year-old male with a history of transaminitis

EXAM:
ULTRASOUND ABDOMEN LIMITED RIGHT UPPER QUADRANT

[Series 1: us abdomen limited · 0.26mm/px · 14 of 40 slices shown]
[im 1/40]
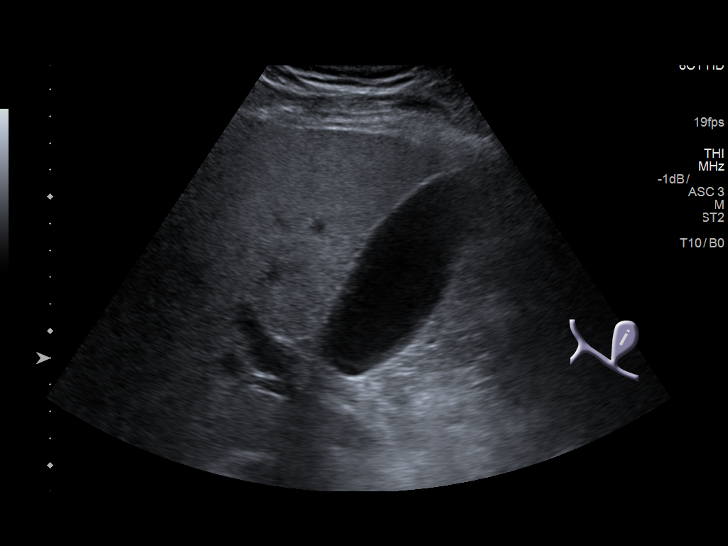
[im 4/40]
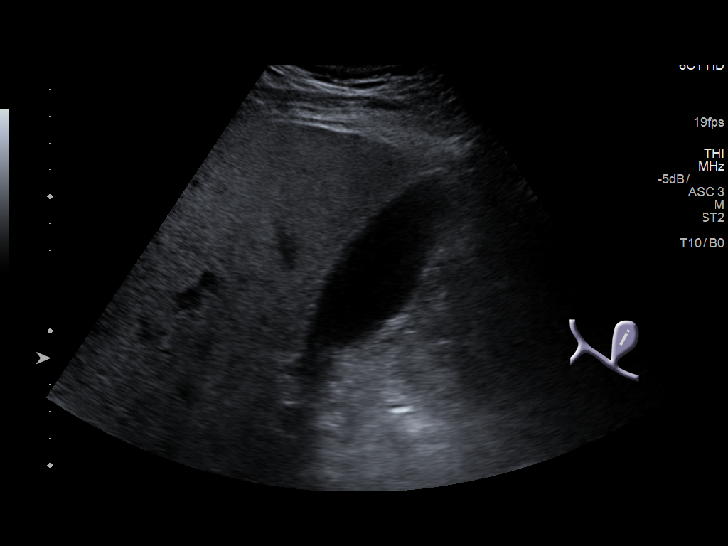
[im 7/40]
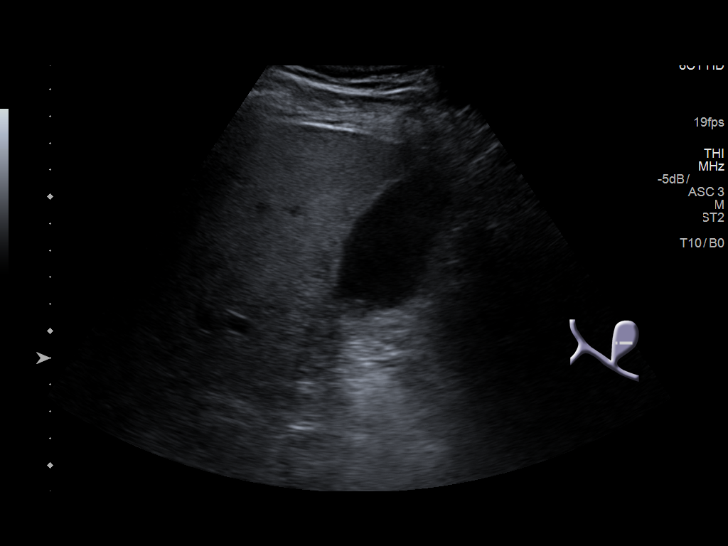
[im 10/40]
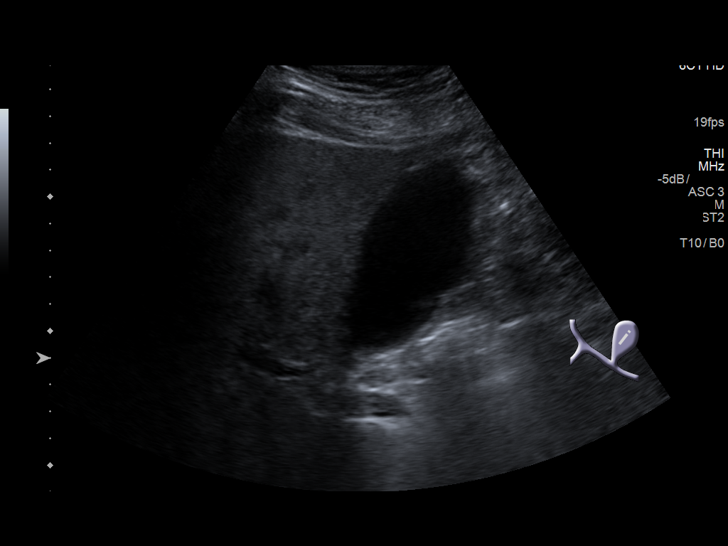
[im 14/40]
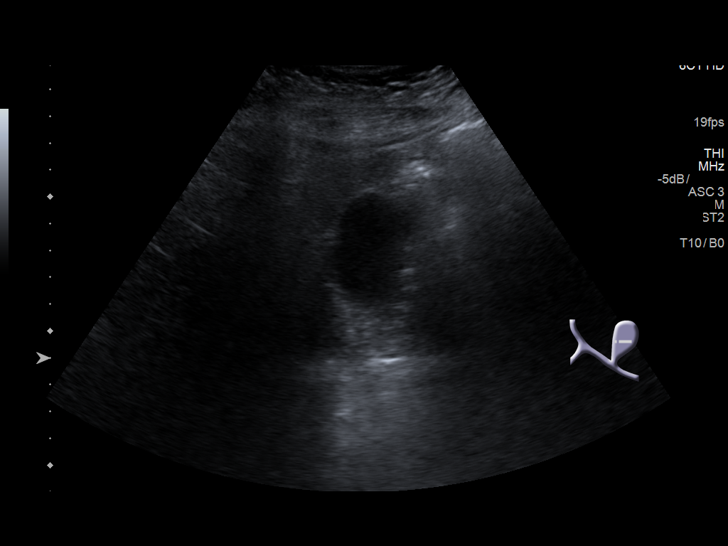
[im 15/40]
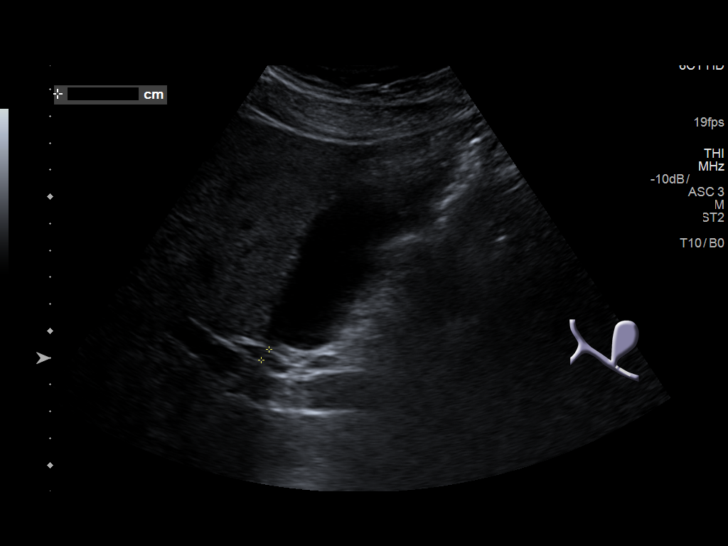
[im 18/40]
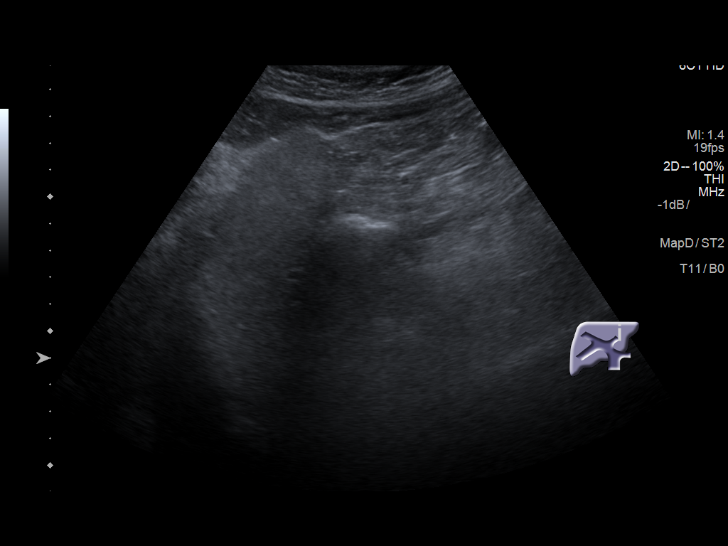
[im 22/40]
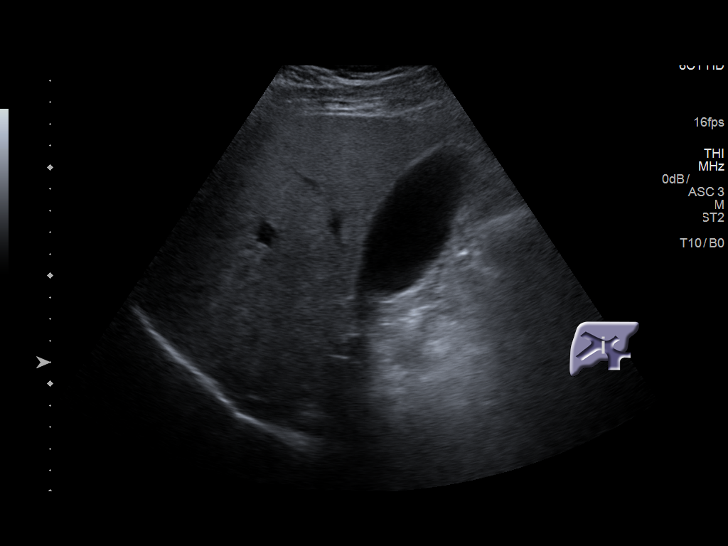
[im 25/40]
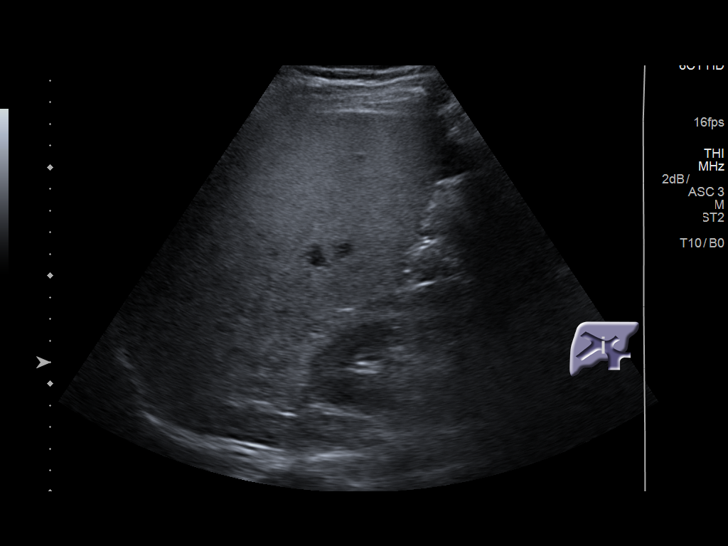
[im 27/40]
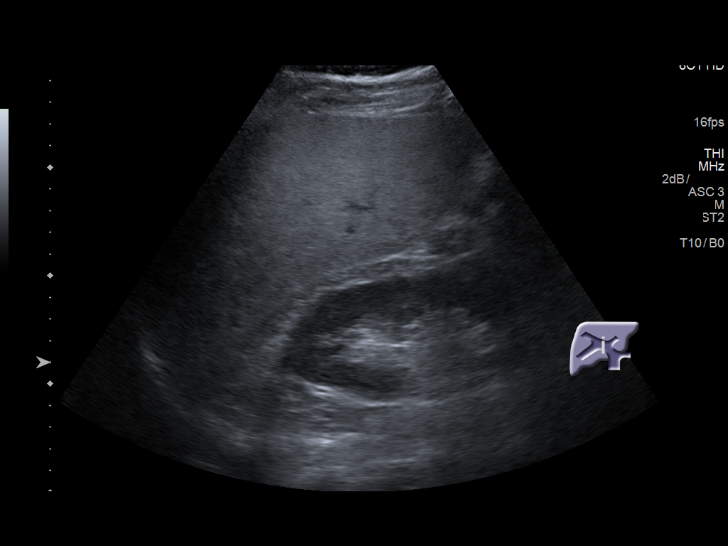
[im 30/40]
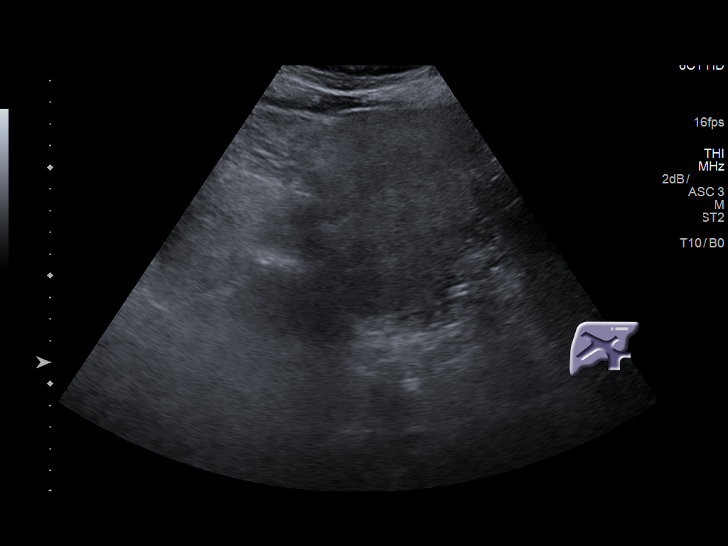
[im 33/40]
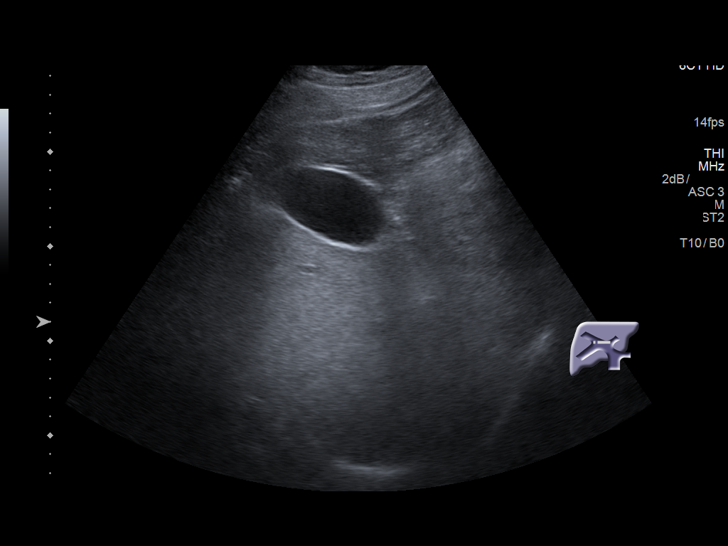
[im 36/40]
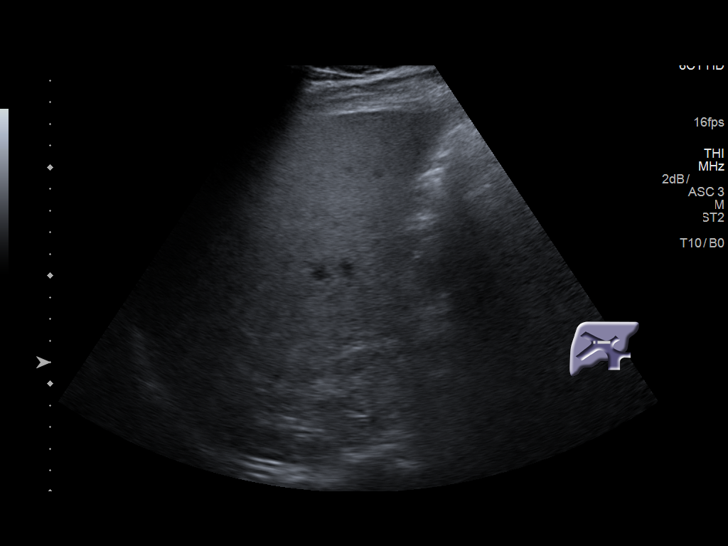
[im 40/40]
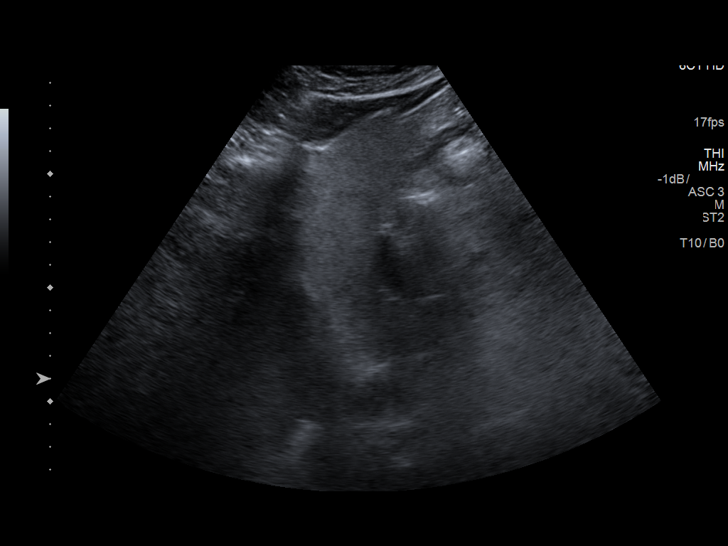

[14 of 25 positions shown; findings below may reference images not displayed]

FINDINGS: Gallbladder:

No gallstones or wall thickening visualized. No sonographic Murphy
sign noted by sonographer.

Common bile duct:

Diameter: 4 mm-5 mm

Liver:

No nodularity. Mildly coarsened echotexture of the liver parenchyma.
Portal vein is patent on color Doppler imaging with normal direction
of blood flow towards the liver.
IMPRESSION: No cholelithiasis.

Increased echogenicity of liver parenchyma is nonspecific,
potentially representing steatosis or other chronic medical liver
disease.

## 2019-01-02 ENCOUNTER — Encounter: Payer: Self-pay | Admitting: Emergency Medicine

## 2019-01-02 ENCOUNTER — Other Ambulatory Visit: Payer: Self-pay

## 2019-01-02 ENCOUNTER — Ambulatory Visit
Admission: EM | Admit: 2019-01-02 | Discharge: 2019-01-02 | Disposition: A | Discharge status: Home / Self Care | Payer: Self-pay | Attending: Family Medicine | Admitting: Family Medicine

## 2019-01-02 DIAGNOSIS — R05 Cough: Secondary | ICD-10-CM

## 2019-01-02 DIAGNOSIS — R059 Cough, unspecified: Secondary | ICD-10-CM

## 2019-01-02 DIAGNOSIS — R112 Nausea with vomiting, unspecified: Secondary | ICD-10-CM

## 2019-01-02 MED ORDER — HYDROCOD POLST-CPM POLST ER 10-8 MG/5ML PO SUER: 5.0000 mL | Freq: Two times a day (BID) | ORAL | 0 refills | Status: DC | PRN

## 2019-01-02 MED ORDER — ONDANSETRON 8 MG PO TBDP: 8.0000 mg | ORAL_TABLET | Freq: Three times a day (TID) | ORAL | 0 refills | Status: DC | PRN

## 2019-01-02 NOTE — ED Triage Notes (Signed)
Pt c/o cough and vomiting. Started about 3 days ago. Denies fever, chills, unusual body aches or SOB. He states that he is feeling better than he was when it started.

## 2019-01-02 NOTE — Discharge Instructions (Signed)
Recommend self quarantine per [place] DHHS guidelines (copy given)

## 2019-01-02 NOTE — ED Provider Notes (Signed)
[facility]    CSN: [id number] Arrival date & time: 01/02/19  0901     History   Chief Complaint Chief Complaint  Patient presents with  . Cough  . Emesis    HPI Franklin Douglas is a 48 y.o. male.   The history is provided by the patient.  Cough  Associated symptoms: no fever and no wheezing   Emesis  Associated symptoms: cough and URI   Associated symptoms: no fever   URI  Presenting symptoms: cough   Presenting symptoms: no congestion, no fatigue and no fever   Severity:  Moderate Onset quality:  Sudden Duration:  3 days Timing:  Constant Progression:  Improving Chronicity:  New Relieved by:  None tried Ineffective treatments:  None tried Associated symptoms: no wheezing   Associated symptoms comment:  Denies shortness of breath Risk factors: not elderly, no chronic cardiac disease, no chronic kidney disease, no chronic respiratory disease, no diabetes mellitus, no immunosuppression, no recent illness and no recent travel     Past Medical History:  Diagnosis Date  . Diabetes mellitus without complication (HCC)   . Diabetic neuropathy (HCC)   . Diverticula, colon   . Diverticulitis   . Sciatica     Patient Active Problem List   Diagnosis Date Noted  . Hematemesis 02/13/2018  . AA (alcohol abuse) 12/02/2015  . 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 Diabetes mellitus (HCC) 05/12/2013  . BP (high blood pressure) 05/12/2013  . Pure hypercholesterolemia 05/12/2013    Past Surgical History:  Procedure Laterality Date  . CARDIAC SURGERY    . COLON SURGERY    . HERNIA REPAIR         Home Medications    Prior to Admission medications   Medication Sig Start Date End Date Taking? Authorizing Provider  clopidogrel (PLAVIX) 75 MG tablet Take 75 mg by mouth daily. 01/12/18  Yes [provider]  fexofenadine (ALLEGRA ALLERGY) 180 MG tablet Take 1 tablet (180 mg total) by mouth daily. 03/18/18 01/02/19 Yes Betancourt, Jarold Song, NP   fluticasone (FLONASE) 50 MCG/ACT nasal spray Place 1 spray into both nostrils 2 (two) times daily. 03/18/18 01/02/19 Yes Betancourt, Jarold Song, NP  insulin aspart (NOVOLOG) 100 UNIT/ML injection Inject 3-6 Units into the skin See admin instructions. Sliding scale 130-160=1unit, 161-190=2units, 191-220=3units...Marland KitchenMarland KitchenMarland Kitchenevery 30points for BS reading equal 1 unit 08/25/16  Yes [provider]  insulin detemir (LEVEMIR) 100 UNIT/ML injection Inject 50 Units into the skin at bedtime.    Yes [provider]  pseudoephedrine (SUDAFED 12 HOUR) 120 MG 12 hr tablet Take 1 tablet (120 mg total) by mouth 2 (two) times daily. 03/18/18  Yes Betancourt, Jarold Song, NP  sodium chloride (OCEAN) 0.65 % SOLN nasal spray Place 2 sprays into both nostrils every 2 (two) hours while awake. 03/18/18 01/02/19 Yes Betancourt, Jarold Song, NP  albuterol (PROVENTIL HFA;VENTOLIN HFA) 108 (90 Base) MCG/ACT inhaler Inhale 1-2 puffs into the lungs every 4 (four) hours as needed for wheezing or shortness of breath. 03/18/18   Betancourt, Jarold Song, NP  aspirin EC 81 MG tablet Take by mouth.    [provider]  chlorpheniramine-HYDROcodone (TUSSIONEX PENNKINETIC ER) 10-8 MG/5ML SUER Take 5 mLs by mouth every 12 (twelve) hours as needed. 01/02/19   [name], MD  ondansetron (ZOFRAN ODT) 8 MG disintegrating tablet Take 1 tablet (8 mg total) by mouth every 8 (eight) hours as needed. 01/02/19   [name], MD    Family History Family History  Problem Relation Age of Onset  . Diabetes Mother   . Diabetes Brother     Social History Social History   Tobacco Use  . Smoking status: Never Smoker  . Smokeless tobacco: Never Used  Substance Use Topics  . Alcohol use: Yes    Alcohol/week: 20.0 standard drinks    Types: 20 Shots of liquor per week    Comment: 8 drinks/ day  . Drug use: No     Allergies   Sulfa antibiotics   Review of Systems Review of Systems  Constitutional: Negative for fatigue and fever.  HENT:  Negative for congestion.   Respiratory: Positive for cough. Negative for wheezing.   Gastrointestinal: Positive for vomiting.     Physical Exam Triage Vital Signs ED Triage Vitals  Enc Vitals Group     BP 01/02/19 0917 (!) 144/99     Pulse Rate 01/02/19 0917 (!) 106     Resp 01/02/19 0917 18     Temp 01/02/19 0917 98.5 F (36.9 C)     Temp Source 01/02/19 0917 Oral     SpO2 01/02/19 0917 99 %     Weight 01/02/19 0913 225 lb (102.1 kg)     Height 01/02/19 0913 5\' 11"  (1.803 m)     Head Circumference --      Peak Flow --      Pain Score 01/02/19 0913 2     Pain Loc --      Pain Edu? --      Excl. in GC? --    No data found.  Updated Vital Signs BP (!) 144/99 (BP Location: Left Arm)   Pulse (!) 106   Temp 98.5 F (36.9 C) (Oral)   Resp 18   Ht 5\' 11"  (1.803 m)   Wt 102.1 kg   SpO2 99%   BMI 31.38 kg/m   Visual Acuity Right Eye Distance:   Left Eye Distance:   Bilateral Distance:    Right Eye Near:   Left Eye Near:    Bilateral Near:     Physical Exam Vitals signs and nursing note reviewed.  Constitutional:      General: He is not in acute distress.    Appearance: Normal appearance. He is well-developed. He is not toxic-appearing or diaphoretic.  HENT:     Head: Normocephalic and atraumatic.     Mouth/Throat:     Pharynx: Uvula midline.     Tonsils: No tonsillar abscesses.  Neck:     Musculoskeletal: Normal range of motion and neck supple.     Thyroid: No thyromegaly.     Trachea: No tracheal deviation.  Cardiovascular:     Rate and Rhythm: Normal rate.  Pulmonary:     Effort: Pulmonary effort is normal. No respiratory distress.  Lymphadenopathy:     Cervical: No cervical adenopathy.  Skin:    General: Skin is warm and dry.     Findings: No rash.  Neurological:     Mental Status: He is alert.      UC Treatments / Results  Labs (all labs ordered are listed, but only abnormal results are displayed) Labs Reviewed - No data to display  EKG  None  Radiology No results found.  Procedures Procedures (including critical care time)  Medications Ordered in UC Medications - No data to display  Initial Impression / Assessment and Plan / UC Course  I have reviewed the triage vital signs and the nursing notes.  Pertinent labs & imaging results that  were available during my care of the patient were reviewed by me and considered in my medical decision making (see chart for details).      Final Clinical Impressions(s) / UC Diagnoses   Final diagnoses:  Cough  Non-intractable vomiting with nausea, unspecified vomiting type     Discharge Instructions     Recommend self quarantine per [place] DHHS guidelines (copy given)    ED Prescriptions    Medication Sig Dispense Auth. Provider   ondansetron (ZOFRAN ODT) 8 MG disintegrating tablet Take 1 tablet (8 mg total) by mouth every 8 (eight) hours as needed. 6 tablet [name], MD   chlorpheniramine-HYDROcodone (TUSSIONEX PENNKINETIC ER) 10-8 MG/5ML SUER Take 5 mLs by mouth every 12 (twelve) hours as needed. 60 mL [name], MD     1. diagnosis reviewed with patient 2. rx as per orders above; reviewed possible side effects, interactions, risks and benefits  3. Recommend supportive treatment with rest, fluids, otc meds prn 4. Follow-up prn if symptoms worsen or don't improve  Controlled Substance Prescriptions [place] Controlled Substance Registry consulted? Not Applicable   [name], MD 01/02/19 [phone]

## 2019-01-12 ENCOUNTER — Inpatient Hospital Stay
Admission: EM | Admit: 2019-01-12 | Discharge: 2019-01-14 | DRG: 897 | Disposition: A | Discharge status: Home / Self Care | Payer: Self-pay | Attending: Internal Medicine | Admitting: Internal Medicine

## 2019-01-12 ENCOUNTER — Emergency Department: Payer: Self-pay

## 2019-01-12 ENCOUNTER — Other Ambulatory Visit: Payer: Self-pay

## 2019-01-12 DIAGNOSIS — Z79899 Other long term (current) drug therapy: Secondary | ICD-10-CM

## 2019-01-12 DIAGNOSIS — R05 Cough: Secondary | ICD-10-CM | POA: Diagnosis present

## 2019-01-12 DIAGNOSIS — K209 Esophagitis, unspecified: Secondary | ICD-10-CM | POA: Diagnosis present

## 2019-01-12 DIAGNOSIS — K529 Noninfective gastroenteritis and colitis, unspecified: Secondary | ICD-10-CM | POA: Diagnosis present

## 2019-01-12 DIAGNOSIS — Z7141 Alcohol abuse counseling and surveillance of alcoholic: Secondary | ICD-10-CM

## 2019-01-12 DIAGNOSIS — Z833 Family history of diabetes mellitus: Secondary | ICD-10-CM

## 2019-01-12 DIAGNOSIS — Z20828 Contact with and (suspected) exposure to other viral communicable diseases: Secondary | ICD-10-CM | POA: Diagnosis present

## 2019-01-12 DIAGNOSIS — E876 Hypokalemia: Secondary | ICD-10-CM | POA: Diagnosis present

## 2019-01-12 DIAGNOSIS — E1140 Type 2 diabetes mellitus with diabetic neuropathy, unspecified: Secondary | ICD-10-CM | POA: Diagnosis present

## 2019-01-12 DIAGNOSIS — Z7902 Long term (current) use of antithrombotics/antiplatelets: Secondary | ICD-10-CM

## 2019-01-12 DIAGNOSIS — Z7982 Long term (current) use of aspirin: Secondary | ICD-10-CM

## 2019-01-12 DIAGNOSIS — E8729 Other acidosis: Secondary | ICD-10-CM | POA: Diagnosis present

## 2019-01-12 DIAGNOSIS — Z794 Long term (current) use of insulin: Secondary | ICD-10-CM

## 2019-01-12 DIAGNOSIS — I10 Essential (primary) hypertension: Secondary | ICD-10-CM | POA: Diagnosis present

## 2019-01-12 DIAGNOSIS — Z03818 Encounter for observation for suspected exposure to other biological agents ruled out: Secondary | ICD-10-CM

## 2019-01-12 DIAGNOSIS — Z7951 Long term (current) use of inhaled steroids: Secondary | ICD-10-CM

## 2019-01-12 DIAGNOSIS — Z882 Allergy status to sulfonamides status: Secondary | ICD-10-CM

## 2019-01-12 DIAGNOSIS — K760 Fatty (change of) liver, not elsewhere classified: Secondary | ICD-10-CM | POA: Diagnosis present

## 2019-01-12 DIAGNOSIS — F10239 Alcohol dependence with withdrawal, unspecified: Principal | ICD-10-CM | POA: Diagnosis present

## 2019-01-12 DIAGNOSIS — E878 Other disorders of electrolyte and fluid balance, not elsewhere classified: Secondary | ICD-10-CM | POA: Diagnosis present

## 2019-01-12 DIAGNOSIS — E872 Acidosis: Secondary | ICD-10-CM | POA: Diagnosis present

## 2019-01-12 LAB — GLUCOSE, CAPILLARY
Glucose-Capillary: 216 mg/dL — ABNORMAL HIGH (ref 70–99)
Glucose-Capillary: 234 mg/dL — ABNORMAL HIGH (ref 70–99)
Glucose-Capillary: 273 mg/dL — ABNORMAL HIGH (ref 70–99)
Glucose-Capillary: 273 mg/dL — ABNORMAL HIGH (ref 70–99)
Glucose-Capillary: 246 mg/dL — ABNORMAL HIGH (ref 70–99)

## 2019-01-12 LAB — CBC WITH DIFFERENTIAL/PLATELET
Abs Immature Granulocytes: 0.01 10*3/uL (ref 0.00–0.07)
Basophils Absolute: 0 10*3/uL (ref 0.0–0.1)
Basophils Relative: 0 %
Eosinophils Absolute: 0 10*3/uL (ref 0.0–0.5)
Eosinophils Relative: 0 %
HCT: 44.4 % (ref 39.0–52.0)
Hemoglobin: 15.3 g/dL (ref 13.0–17.0)
Immature Granulocytes: 0 %
Lymphocytes Relative: 11 %
Lymphs Abs: 0.6 10*3/uL — ABNORMAL LOW (ref 0.7–4.0)
MCH: 32.2 pg (ref 26.0–34.0)
MCHC: 34.5 g/dL (ref 30.0–36.0)
MCV: 93.5 fL (ref 80.0–100.0)
Monocytes Absolute: 0.5 10*3/uL (ref 0.1–1.0)
Monocytes Relative: 9 %
Neutro Abs: 4.8 10*3/uL (ref 1.7–7.7)
Neutrophils Relative %: 80 %
Platelets: 170 10*3/uL (ref 150–400)
RBC: 4.75 MIL/uL (ref 4.22–5.81)
RDW: 12.2 % (ref 11.5–15.5)
WBC: 6 10*3/uL (ref 4.0–10.5)
nRBC: 0 % (ref 0.0–0.2)

## 2019-01-12 LAB — COMPREHENSIVE METABOLIC PANEL
ALT: 43 U/L (ref 0–44)
AST: 40 U/L (ref 15–41)
Albumin: 4.7 g/dL (ref 3.5–5.0)
Alkaline Phosphatase: 87 U/L (ref 38–126)
Anion gap: 24 — ABNORMAL HIGH (ref 5–15)
BUN: 22 mg/dL — ABNORMAL HIGH (ref 6–20)
CO2: 23 mmol/L (ref 22–32)
Calcium: 9 mg/dL (ref 8.9–10.3)
Chloride: 88 mmol/L — ABNORMAL LOW (ref 98–111)
Creatinine, Ser: 1.22 mg/dL (ref 0.61–1.24)
GFR calc Af Amer: >60 mL/min (ref >60)
GFR calc non Af Amer: >60 mL/min (ref >60)
Glucose, Bld: 286 mg/dL — ABNORMAL HIGH (ref 70–99)
Potassium: 3.5 mmol/L (ref 3.5–5.1)
Sodium: 135 mmol/L (ref 135–145)
Total Bilirubin: 2.4 mg/dL — ABNORMAL HIGH (ref 0.3–1.2)
Total Protein: 8.3 g/dL — ABNORMAL HIGH (ref 6.5–8.1)

## 2019-01-12 LAB — BLOOD GAS, VENOUS
Acid-Base Excess: 3.2 mmol/L — ABNORMAL HIGH (ref 0.0–2.0)
Bicarbonate: 27.9 mmol/L (ref 20.0–28.0)
O2 Saturation: 83 %
Patient temperature: 37
pCO2, Ven: 42 mmHg — ABNORMAL LOW (ref 44.0–60.0)
pH, Ven: 7.43 (ref 7.250–7.430)
pO2, Ven: 46 mmHg — ABNORMAL HIGH (ref 32.0–45.0)

## 2019-01-12 LAB — MRSA PCR SCREENING: MRSA by PCR: NEGATIVE

## 2019-01-12 MED ORDER — PANTOPRAZOLE SODIUM 40 MG IV SOLR
40.0000 mg | Freq: Once | INTRAVENOUS | Status: AC
  Administered 2019-01-12: 07:00:00 40 mg via INTRAVENOUS
  Filled 2019-01-12: qty 40

## 2019-01-12 MED ORDER — LORAZEPAM 2 MG/ML IJ SOLN
1.0000 mg | Freq: Once | INTRAMUSCULAR | Status: AC
  Administered 2019-01-12: 06:00:00 1 mg via INTRAVENOUS
  Filled 2019-01-12: qty 1

## 2019-01-12 MED ORDER — INSULIN ASPART 100 UNIT/ML ~~LOC~~ SOLN
0.0000 [IU] | Freq: Three times a day (TID) | SUBCUTANEOUS | Status: DC
  Administered 2019-01-12: 17:00:00 3 [IU] via SUBCUTANEOUS
  Administered 2019-01-12: 16:00:00 5 [IU] via SUBCUTANEOUS
  Administered 2019-01-13: 2 [IU] via SUBCUTANEOUS
  Administered 2019-01-13: 3 [IU] via SUBCUTANEOUS
  Administered 2019-01-13: 17:00:00 5 [IU] via SUBCUTANEOUS
  Administered 2019-01-14: 1 [IU] via SUBCUTANEOUS
  Filled 2019-01-12 (×2): qty 1
  Filled 2019-01-12: qty 0.09
  Filled 2019-01-12 (×5): qty 1

## 2019-01-12 MED ORDER — ONDANSETRON HCL 4 MG/2ML IJ SOLN
4.0000 mg | Freq: Once | INTRAMUSCULAR | Status: AC
  Administered 2019-01-12: 4 mg via INTRAVENOUS
  Filled 2019-01-12: qty 2

## 2019-01-12 MED ORDER — INSULIN ASPART 100 UNIT/ML ~~LOC~~ SOLN
0.0000 [IU] | Freq: Every day | SUBCUTANEOUS | Status: DC
  Administered 2019-01-12 – 2019-01-13 (×2): 2 [IU] via SUBCUTANEOUS
  Filled 2019-01-12: qty 0.05
  Filled 2019-01-12 (×2): qty 1

## 2019-01-12 MED ORDER — ONDANSETRON HCL 4 MG PO TABS
4.0000 mg | ORAL_TABLET | Freq: Four times a day (QID) | ORAL | Status: DC | PRN
  Filled 2019-01-12: qty 1

## 2019-01-12 MED ORDER — SODIUM CHLORIDE 0.9 % IV BOLUS
1000.0000 mL | Freq: Once | INTRAVENOUS | Status: AC
  Administered 2019-01-12: 06:00:00 1000 mL via INTRAVENOUS

## 2019-01-12 MED ORDER — SENNA 8.6 MG PO TABS
1.0000 | ORAL_TABLET | Freq: Two times a day (BID) | ORAL | Status: DC
  Filled 2019-01-12 (×5): qty 1

## 2019-01-12 MED ORDER — IOHEXOL 300 MG/ML  SOLN
100.0000 mL | Freq: Once | INTRAMUSCULAR | Status: AC | PRN
  Administered 2019-01-12: 08:00:00 100 mL via INTRAVENOUS
  Filled 2019-01-12: qty 100

## 2019-01-12 MED ORDER — ONDANSETRON HCL 4 MG/2ML IJ SOLN: 4.0000 mg | Freq: Four times a day (QID) | INTRAMUSCULAR | Status: DC | PRN

## 2019-01-12 MED ORDER — ENOXAPARIN SODIUM 40 MG/0.4ML ~~LOC~~ SOLN
40.0000 mg | SUBCUTANEOUS | Status: DC
  Administered 2019-01-12 – 2019-01-13 (×2): 40 mg via SUBCUTANEOUS
  Filled 2019-01-12 (×3): qty 0.4

## 2019-01-12 MED ORDER — THIAMINE HCL 100 MG/ML IJ SOLN
INTRAVENOUS | Status: AC
  Administered 2019-01-12: 16:00:00 via INTRAVENOUS
  Filled 2019-01-12 (×4): qty 1000

## 2019-01-12 MED ORDER — HYDRALAZINE HCL 20 MG/ML IJ SOLN: 10.0000 mg | Freq: Four times a day (QID) | INTRAMUSCULAR | Status: DC | PRN

## 2019-01-12 NOTE — ED Notes (Signed)
Dr. Amado Coe at bedside at this time

## 2019-01-12 NOTE — Progress Notes (Signed)
Family Meeting Note  Advance Directive:yes  Today a meeting took place with the Patient.    The following clinical team members were present during this meeting:MD  The following were discussed:Patient's diagnosis: Acute gastroenteritis, hypochloremic anion gap acidosis, alcohol abuse, history of diabetes mellitus, treatment plan of care discussed in detail with the patient.  Patient is tested for covid as he is reporting fever and cough for 2 weeks.  Chest x-ray negative.  Treatment plan of care discussed in detail with the patient.  He verbalized understanding of the plan   patient's progosis: Unable to determine and Goals for treatment: Full Code  [name] girlfriend is the healthcare POA  Additional follow-up to be provided: Hospitalist  Time spent during discussion:17 min  Ramonita Lab, MD

## 2019-01-12 NOTE — Progress Notes (Signed)
Patient admitted to unit 2A from the emergency department and is now under the care of this writer. A&O x4, vital signs within admission baseline, respirations even and unlabored, no apparent distress noted. Droplet and Contact Isolation maintained. Skin clean/dry/intact (see chart for full assessment of skin, etc.). Patient resting in bed; no additional questions or concerns voiced at this time. Will cont to monitor.   Christen Butter, RN

## 2019-01-12 NOTE — ED Provider Notes (Signed)
-----------------------------------------   9:37 AM on 01/12/2019 -----------------------------------------   CT negative for acute intra-abdominal pathology.  Case discussed with the hospitalist for further management of alcohol withdrawal and hypochloremic anion gap metabolic acidosis.  Possible COVID status discussed.   Sharman Cheek, MD 01/12/19 [phone]

## 2019-01-12 NOTE — ED Provider Notes (Signed)
[facility] Emergency Department Provider Note    First MD Initiated Contact with Patient 01/12/19 [phone]     (approximate)  I have reviewed the triage vital signs and the nursing notes.   HISTORY  Chief Complaint Nausea and Cough    HPI Franklin Douglas is a 48 y.o. male with medical history as listed below with below list of chronic medical conditions including diabetes mellitus hypertension and alcohol abuse presents to the emergency department with cough congestion loose stools x2 weeks.  Patient states that he went to [facility] and was instructed to go to the [facility] testing site for cover testing however he states when he arrived there it was closed.  Patient states that his symptoms have been improving until 3 days ago when he started developing nausea and vomiting with inability to tolerate p.o. with accompanying cough.  Patient denies any fever afebrile on presentation.  Of note the patient states that he does drink heavily daily and stopped drinking 3 days ago secondary to not feeling well.  Patient does admit to previous history of alcohol withdrawal.  Patient states that he did note streaks of blood today with vomiting.   Past Medical History:  Diagnosis Date   Diabetes mellitus without complication (HCC)    Diabetic neuropathy (HCC)    Diverticula, colon    Diverticulitis    Sciatica     Patient Active Problem List   Diagnosis Date Noted   Hematemesis 02/13/2018   AA (alcohol abuse) 12/02/2015   Peripheral neuropathic pain 12/02/2015   Diabetes mellitus (HCC) 05/12/2013   BP (high blood pressure) 05/12/2013   Pure hypercholesterolemia 05/12/2013    Past Surgical History:  Procedure Laterality Date   CARDIAC SURGERY     COLON SURGERY     HERNIA REPAIR      Prior to Admission medications   Medication Sig Start Date End Date Taking? Authorizing Provider  albuterol (PROVENTIL HFA;VENTOLIN HFA) 108 (90 Base)  MCG/ACT inhaler Inhale 1-2 puffs into the lungs every 4 (four) hours as needed for wheezing or shortness of breath. 03/18/18   Betancourt, Jarold Song, NP  aspirin EC 81 MG tablet Take by mouth.    [provider]  chlorpheniramine-HYDROcodone (TUSSIONEX PENNKINETIC ER) 10-8 MG/5ML SUER Take 5 mLs by mouth every 12 (twelve) hours as needed. 01/02/19   [name], MD  clopidogrel (PLAVIX) 75 MG tablet Take 75 mg by mouth daily. 01/12/18   [provider]  fexofenadine (ALLEGRA ALLERGY) 180 MG tablet Take 1 tablet (180 mg total) by mouth daily. 03/18/18 01/02/19  Betancourt, Jarold Song, NP  fluticasone (FLONASE) 50 MCG/ACT nasal spray Place 1 spray into both nostrils 2 (two) times daily. 03/18/18 01/02/19  Betancourt, Jarold Song, NP  insulin aspart (NOVOLOG) 100 UNIT/ML injection Inject 3-6 Units into the skin See admin instructions. Sliding scale 130-160=1unit, 161-190=2units, 191-220=3units...Marland KitchenMarland KitchenMarland Kitchenevery 30points for BS reading equal 1 unit 08/25/16   [provider]  insulin detemir (LEVEMIR) 100 UNIT/ML injection Inject 50 Units into the skin at bedtime.     [provider]  ondansetron (ZOFRAN ODT) 8 MG disintegrating tablet Take 1 tablet (8 mg total) by mouth every 8 (eight) hours as needed. 01/02/19   [name], MD  pseudoephedrine (SUDAFED 12 HOUR) 120 MG 12 hr tablet Take 1 tablet (120 mg total) by mouth 2 (two) times daily. 03/18/18   Betancourt, Jarold Song, NP  sodium chloride (OCEAN) 0.65 % SOLN nasal spray Place 2 sprays into  both nostrils every 2 (two) hours while awake. 03/18/18 01/02/19  Betancourt, Jarold Song, NP    Allergies Sulfa antibiotics  Family History  Problem Relation Age of Onset   Diabetes Mother    Diabetes Brother     Social History Social History   Tobacco Use   Smoking status: Never Smoker   Smokeless tobacco: Never Used  Substance Use Topics   Alcohol use: Yes    Alcohol/week: 20.0 standard drinks    Types: 20 Shots of liquor per week     Comment: 8 drinks/ day   Drug use: No    Review of Systems Constitutional: No fever/chills Eyes: No visual changes. ENT: No sore throat. Cardiovascular: Denies chest pain. Respiratory: Denies shortness of breath. Gastrointestinal: No abdominal pain.  Positive for nausea vomiting cough Genitourinary: Negative for dysuria. Musculoskeletal: Negative for neck pain.  Negative for back pain. Integumentary: Negative for rash. Neurological: Negative for headaches, focal weakness or numbness.  ____________________________________________   PHYSICAL EXAM:  VITAL SIGNS: ED Triage Vitals  Enc Vitals Group     BP 01/12/19 0419 (!) 159/100     Pulse Rate 01/12/19 0419 (!) 119     Resp 01/12/19 0419 19     Temp 01/12/19 0419 98.2 F (36.8 C)     Temp Source 01/12/19 0419 Oral     SpO2 01/12/19 0419 97 %     Weight 01/12/19 0447 102.1 kg (225 lb 1.4 oz)     Height 01/12/19 0447 1.803 m ( )     Head Circumference --      Peak Flow --      Pain Score 01/12/19 0447 0     Pain Loc --      Pain Edu? --      Excl. in GC? --     Constitutional: Alert and oriented. Well appearing and in no acute distress. Eyes: Conjunctivae are normal. PERRL. EOMI. Head: Atraumatic. Mouth/Throat: Mucous membranes are moist.  Oropharynx non-erythematous. Neck: No stridor.   Cardiovascular: Tachycardia, regular rhythm. Good peripheral circulation. Grossly normal heart sounds. Respiratory: Normal respiratory effort.  No retractions. Lungs CTAB. Gastrointestinal: Soft and nontender. No distention.  Musculoskeletal: No lower extremity tenderness nor edema. No gross deformities of extremities. Neurologic:  Normal speech and language. No gross focal neurologic deficits are appreciated.  Skin:  Skin is warm, dry and intact. No rash noted. Psychiatric: Mood and affect are normal. Speech and behavior are normal.  ____________________________________________   LABS (all labs ordered are listed, but only  abnormal results are displayed)  Labs Reviewed  CBC WITH DIFFERENTIAL/PLATELET - Abnormal; Notable for the following components:      Result Value   Lymphs Abs 0.6 (*)    All other components within normal limits  COMPREHENSIVE METABOLIC PANEL - Abnormal; Notable for the following components:   Chloride 88 (*)    Glucose, Bld 286 (*)    BUN 22 (*)    Total Protein 8.3 (*)    Total Bilirubin 2.4 (*)    Anion gap 24 (*)    All other components within normal limits  NOVEL CORONAVIRUS, NAA (HOSPITAL ORDER, SEND-OUT TO REF LAB)     RADIOLOGY I, [place] Briel Gallicchio, personally viewed and evaluated these images (plain radiographs) as part of my medical decision making, as well as reviewing the written report by the radiologist.  ED MD interpretation: Negative chest x-ray per radiologist  Official radiology report(s): Dg Chest Port 1 View  Result Date: 01/12/2019 CLINICAL DATA:  Cough.  Flu like symptoms EXAM: PORTABLE CHEST 1 VIEW COMPARISON:  02/12/2018 FINDINGS: Normal heart size and mediastinal contours. No acute infiltrate or edema. No effusion or pneumothorax. No acute osseous findings. IMPRESSION: Negative chest. Electronically Signed   By: Marnee SpringJonathon  Watts M.D.   On: 01/12/2019 05:32      Procedures   ____________________________________________   INITIAL IMPRESSION / MDM / ASSESSMENT AND PLAN / ED COURSE  As part of my medical decision making, I reviewed the following data within the electronic MEDICAL RECORD NUMBER    Verne CarrowDerek C Schul was evaluated in Emergency Department on 01/12/2019 for the symptoms described in the history of present illness. He was evaluated in the context of the global COVID-19 pandemic, which necessitated consideration that the patient might be at risk for infection with the SARS-CoV-2 virus that causes COVID-19. Institutional protocols and algorithms that pertain to the evaluation of patients at risk for COVID-19 are in a state of rapid change based on  information released by regulatory bodies including the CDC and federal and state organizations. These policies and algorithms were followed during the patient's care in the ED.      48 year old male presenting with above-stated history and physical exam concerning for possible upper GI bleed in the setting of alcohol abuse and possible recurrent alcohol withdrawal.  Patient given 1 mg IV Ativan 2 L IV normal saline with improvement of heart rate.  Regarding the patient's cough and 2-week history of symptoms concerning for possible bronchitis pneumonia and COVID-19 and as such chest x-ray was performed which revealed no evidence of pneumonia or bronchitis.  COVID-19 testing was performed.  Patient's laboratory data notable for anion gap of 24 with a chloride of 88. CT scan of the abdomen pelvis was performed and is pending at this time.  Patient's care transferred to Dr. Scotty CourtStafford with anticipation of hospital admission. ____________________________________________  FINAL CLINICAL IMPRESSION(S) / ED DIAGNOSES  Final diagnoses:  High anion gap metabolic acidosis  Alcohol dependence with withdrawal with complication (HCC)     MEDICATIONS GIVEN DURING THIS VISIT:  Medications  sodium chloride 0.9 % bolus 1,000 mL (has no administration in time range)  sodium chloride 0.9 % bolus 1,000 mL (1,000 mLs Intravenous New Bag/Given 01/12/19 0601)  ondansetron (ZOFRAN) injection 4 mg (4 mg Intravenous Given 01/12/19 0559)  LORazepam (ATIVAN) injection 1 mg (1 mg Intravenous Given 01/12/19 0559)     ED Discharge Orders    None       Note:  This document was prepared using Dragon voice recognition software and may include unintentional dictation errors.   Darci CurrentBrown, [place] N, MD 01/12/19 [phone]

## 2019-01-12 NOTE — ED Notes (Signed)
ED TO INPATIENT HANDOFF REPORT  ED Nurse Name and Phone #: [place] RN [phone]  S Name/Age/Gender Franklin Douglas 48 y.o. male Room/Bed: ED34A/ED34A  Code Status   Code Status: Full Code  Home/SNF/Other Home Patient oriented to: self, place, time and situation Is this baseline? Yes   Triage Complete: Triage complete  Chief Complaint [facility] - Flu like symptoms  Triage Note Patient came in [facility] for flu like symptoms with cough, nausea, vomiting, diarrhea x3 days. Denies fever. Vitals for EMS 167/100 HR 122, 97% RA, 278 CBG, 99.3 F     Patient states has been seen at [facility] two weeks ago for possible COVID19 symptoms. Patient states at that time was having some diarrhea and cough. Was not given COVID19 test and was told to quirintine at home for 14 day. Patient drove self to [facility] to be tested at testing site. Testing site was closed. Patient states he started feeling better and then three days ago started having nausea and vomiting. Patient states unable to hold anything down for three days and since has started having cough. Patient states having a sore throat but thinks its due to coughing.    Allergies Allergies  Allergen Reactions  . Sulfa Antibiotics Hives, Shortness Of Breath, Rash and Anaphylaxis  . Ace Inhibitors     Cough    Level of Care/Admitting Diagnosis ED Disposition    ED Disposition Condition Comment   Admit  Hospital Area: [facility] [[id number]]  Level of Care: Telemetry [5]  Diagnosis: Metabolic acidosis, increased anion gap (IAG) [375436]  Admitting Physician: Ramonita Lab [[id number]]  Attending Physician: Ramonita Lab [[id number]]  Estimated length of stay: 3 - 4 days  Certification:: I certify this patient will need inpatient services for at least 2 midnights  Bed request comments: 2 a   covid suspected, low - droplet  PT Class (Do Not Modify): Inpatient [101]  PT Acc Code (Do Not Modify): Private [1]       B Medical/Surgery  History Past Medical History:  Diagnosis Date  . Diabetes mellitus without complication (HCC)   . Diabetic neuropathy (HCC)   . Diverticula, colon   . Diverticulitis   . Sciatica    Past Surgical History:  Procedure Laterality Date  . CARDIAC SURGERY    . COLON SURGERY    . HERNIA REPAIR       A IV Location/Drains/Wounds Patient Lines/Drains/Airways Status   Active Line/Drains/Airways    Name:   Placement date:   Placement time:   Site:   Days:   Peripheral IV 02/12/18 Left Antecubital   02/12/18    2300    Antecubital   334   Peripheral IV 01/12/19 Left Antecubital   01/12/19    0459    Antecubital   less than 1          Intake/Output Last 24 hours No intake or output data in the 24 hours ending 01/12/19 1231  Labs/Imaging Results for orders placed or performed during the hospital encounter of 01/12/19 (from the past 48 hour(s))  CBC with Differential     Status: Abnormal   Collection Time: 01/12/19  4:30 AM  Result Value Ref Range   WBC 6.0 4.0 - 10.5 K/uL   RBC 4.75 4.22 - 5.81 MIL/uL   Hemoglobin 15.3 13.0 - 17.0 g/dL   HCT 06.7 70.3 - 40.3 %   MCV 93.5 80.0 - 100.0 fL   MCH 32.2 26.0 - 34.0 pg  MCHC 34.5 30.0 - 36.0 g/dL   RDW 16.112.2 09.611.5 - 04.515.5 %   Platelets 170 150 - 400 K/uL   nRBC 0.0 0.0 - 0.2 %   Neutrophils Relative % 80 %   Neutro Abs 4.8 1.7 - 7.7 K/uL   Lymphocytes Relative 11 %   Lymphs Abs 0.6 (L) 0.7 - 4.0 K/uL   Monocytes Relative 9 %   Monocytes Absolute 0.5 0.1 - 1.0 K/uL   Eosinophils Relative 0 %   Eosinophils Absolute 0.0 0.0 - 0.5 K/uL   Basophils Relative 0 %   Basophils Absolute 0.0 0.0 - 0.1 K/uL   Immature Granulocytes 0 %   Abs Immature Granulocytes 0.01 0.00 - 0.07 K/uL    Comment: Performed at [facility], [address] [place], [place] [address]  Comprehensive metabolic panel     Status: Abnormal   Collection Time: 01/12/19  4:30 AM  Result Value Ref Range   Sodium 135 135 - 145 mmol/L    Comment: LYTES  REPEATED SMA   Potassium 3.5 3.5 - 5.1 mmol/L   Chloride 88 (L) 98 - 111 mmol/L   CO2 23 22 - 32 mmol/L   Glucose, Bld 286 (H) 70 - 99 mg/dL   BUN 22 (H) 6 - 20 mg/dL   Creatinine, Ser 1.911.22 0.61 - 1.24 mg/dL   Calcium 9.0 8.9 - 47.810.3 mg/dL   Total Protein 8.3 (H) 6.5 - 8.1 g/dL   Albumin 4.7 3.5 - 5.0 g/dL   AST 40 15 - 41 U/L   ALT 43 0 - 44 U/L   Alkaline Phosphatase 87 38 - 126 U/L   Total Bilirubin 2.4 (H) 0.3 - 1.2 mg/dL   GFR calc non Af Amer >60 >60 mL/min   GFR calc Af Amer >60 >60 mL/min   Anion gap 24 (H) 5 - 15    Comment: Performed at [facility], [address] [place], [place] [address]  Blood gas, venous     Status: Abnormal   Collection Time: 01/12/19  6:38 AM  Result Value Ref Range   pH, Ven 7.43 7.250 - 7.430   pCO2, Ven 42 (L) 44.0 - 60.0 mmHg   pO2, Ven 46.0 (H) 32.0 - 45.0 mmHg   Bicarbonate 27.9 20.0 - 28.0 mmol/L   Acid-Base Excess 3.2 (H) 0.0 - 2.0 mmol/L   O2 Saturation 83.0 %   Patient temperature 37.0    Collection site VEIN    Sample type VEIN     Comment: Performed at [facility], [address] [place], [place] [address]   Ct Abdomen Pelvis W Contrast  Result Date: 01/12/2019 CLINICAL DATA:  Nausea, vomiting EXAM: CT ABDOMEN AND PELVIS WITH CONTRAST TECHNIQUE: Multidetector CT imaging of the abdomen and pelvis was performed using the standard protocol following bolus administration of intravenous contrast. CONTRAST:  100mL OMNIPAQUE IOHEXOL 300 MG/ML  SOLN COMPARISON:  Ultrasound 02/13/2018 FINDINGS: Lower chest: Distal esophagus is thickened which may reflect esophagitis. Lung bases clear. No effusions. Heart is normal size. Hepatobiliary: Diffuse fatty infarcts crash that Diffuse low-density throughout the liver compatible with fatty infiltration. No focal abnormality. Gallbladder unremarkable. Pancreas: No focal abnormality or ductal dilatation. Spleen: No focal abnormality.  Normal size. Adrenals/Urinary Tract: No adrenal  abnormality. No focal renal abnormality. No stones or hydronephrosis. Urinary bladder is unremarkable. 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 No complicating feature. No evidence  of volvulus or obstruction. Vascular/Lymphatic: No evidence of aneurysm or adenopathy. Retroaortic left renal vein noted. Reproductive: No visible focal abnormality. Other: No free fluid or free air. Musculoskeletal: No acute bony abnormality. IMPRESSION: Fatty infiltration of the liver. Circumferential wall thickening in the visualized distal esophagus may reflect esophagitis. No acute findings in the abdomen or pelvis. Electronically Signed   By: Charlett Nose M.D.   On: 01/12/2019 08:12   Dg Chest Port 1 View  Result Date: 01/12/2019 CLINICAL DATA:  Cough.  Flu like symptoms EXAM: PORTABLE CHEST 1 VIEW COMPARISON:  02/12/2018 FINDINGS: Normal heart size and mediastinal contours. No acute infiltrate or edema. No effusion or pneumothorax. No acute osseous findings. IMPRESSION: Negative chest. Electronically Signed   By: Marnee Spring M.D.   On: 01/12/2019 05:32    Pending Labs Unresulted Labs (From admission, onward)    Start     Ordered   01/19/19 0500  Creatinine, serum  (enoxaparin (LOVENOX)    CrCl >/= 30 ml/min)  Weekly,   STAT    Comments:  while on enoxaparin therapy    01/12/19 1002   01/13/19 0500  Hemoglobin A1c  Tomorrow morning,   STAT     01/12/19 1002   01/13/19 0500  Comprehensive metabolic panel  Tomorrow morning,   STAT     01/12/19 1002   01/13/19 0500  CBC  Tomorrow morning,   STAT     01/12/19 1002   01/13/19 0500  Magnesium  Tomorrow morning,   STAT     01/12/19 1002   01/12/19 1001  Culture, sputum-assessment  Once,   STAT     01/12/19 1002   01/12/19 0556  Novel Coronavirus, NAA (hospital order; send-out to ref lab)  (Novel Coronavirus, NAA [facility] Order; send-out to ref lab) with precautions panel)   Once,   STAT    Question Answer Comment  Current symptoms Fever and Cough   Excluded other viral illnesses Yes   Exposure Risk None   Patient immune status Normal      01/12/19 0556          Vitals/Pain Today's Vitals   01/12/19 0447 01/12/19 0713 01/12/19 0756 01/12/19 1034  BP:   (!) 143/86   Pulse:  (!) 105  95  Resp:  20    Temp:      TempSrc:      SpO2:  99%  98%  Weight: 102.1 kg     Height:  (1.803 m)     PainSc: 0-No pain       Isolation Precautions Droplet and Contact precautions  Medications Medications  enoxaparin (LOVENOX) injection 40 mg (has no administration in time range)  ondansetron (ZOFRAN) tablet 4 mg (has no administration in time range)    Or  ondansetron (ZOFRAN) injection 4 mg (has no administration in time range)  senna (SENOKOT) tablet 8.6 mg (has no administration in time range)  sodium chloride 0.9 % 1,000 mL with thiamine 100 mg, folic acid 1 mg, multivitamins adult 10 mL infusion (has no administration in time range)  insulin aspart (novoLOG) injection 0-9 Units (has no administration in time range)  insulin aspart (novoLOG) injection 0-5 Units (has no administration in time range)  sodium chloride 0.9 % bolus 1,000 mL (0 mLs Intravenous Stopped 01/12/19 0752)  sodium chloride 0.9 % bolus 1,000 mL (0 mLs Intravenous Stopped 01/12/19 0633)  ondansetron (ZOFRAN) injection 4 mg (4 mg Intravenous Given 01/12/19 0559)  LORazepam (ATIVAN) injection 1 mg (1  mg Intravenous Given 01/12/19 0559)  pantoprazole (PROTONIX) injection 40 mg (40 mg Intravenous Given 01/12/19 0654)  iohexol (OMNIPAQUE) 300 MG/ML solution 100 mL (100 mLs Intravenous Contrast Given 01/12/19 0743)    Mobility walks Low fall risk   Focused Assessments    R Recommendations: See Admitting Provider Note  Report given to:   Additional Notes:

## 2019-01-12 NOTE — ED Triage Notes (Signed)
Patient came in [facility] for flu like symptoms with cough, nausea, vomiting, diarrhea x3 days. Denies fever. Vitals for EMS 167/100 HR 122, 97% RA, 278 CBG, 99.3 F

## 2019-01-12 NOTE — ED Notes (Signed)
Pt comfortable, denies any needs at this time

## 2019-01-12 NOTE — ED Notes (Signed)
Patient states vomit appears brown in color like coffee grounds.

## 2019-01-12 NOTE — ED Notes (Signed)
Patient transported to CT 

## 2019-01-12 NOTE — ED Notes (Signed)
Receiving nurse called ready to receive patient.

## 2019-01-12 NOTE — ED Triage Notes (Signed)
Patient states has been seen at [facility] two weeks ago for possible COVID19 symptoms. Patient states at that time was having some diarrhea and cough. Was not given COVID19 test and was told to quirintine at home for 14 day. Patient drove self to [facility] to be tested at testing site. Testing site was closed. Patient states he started feeling better and then three days ago started having nausea and vomiting. Patient states unable to hold anything down for three days and since has started having cough. Patient states having a sore throat but thinks its due to coughing.

## 2019-01-12 NOTE — H&P (Signed)
[facility] - [place] at [facility]   PATIENT NAME: Franklin Douglas    MR#:  [id number]  DATE OF BIRTH:  Jul 10, 1971  DATE OF ADMISSION:  01/12/2019  PRIMARY CARE PHYSICIAN: [facility]   REQUESTING/REFERRING PHYSICIAN: Scotty Court  CHIEF COMPLAINT:   Cough, nausea vomiting and diarrhea for 3 days HISTORY OF PRESENT ILLNESS:  Franklin Douglas  is a 48 y.o. male with a known history of diabetes mellitus, diabetic neuropathy, history of diverticulosis alcohol abuse is presented to the ED with a chief complaint of cough nausea vomiting diarrhea for 3 days.  The abdomen is negative.  Anion gap is a 24  patient had a covid testing in view of cough for 2 weeks as well his team is called to admit the patient.  Patient admits drinking whiskey a day before yesterday  PAST MEDICAL HISTORY:   Past Medical History:  Diagnosis Date  . Diabetes mellitus without complication (HCC)   . Diabetic neuropathy (HCC)   . Diverticula, colon   . Diverticulitis   . Sciatica     PAST SURGICAL HISTOIRY:   Past Surgical History:  Procedure Laterality Date  . CARDIAC SURGERY    . COLON SURGERY    . HERNIA REPAIR      SOCIAL HISTORY:   Social History   Tobacco Use  . Smoking status: Never Smoker  . Smokeless tobacco: Never Used  Substance Use Topics  . Alcohol use: Yes    Alcohol/week: 20.0 standard drinks    Types: 20 Shots of liquor per week    Comment: 8 drinks/ day    FAMILY HISTORY:   Family History  Problem Relation Age of Onset  . Diabetes Mother   . Diabetes Brother     DRUG ALLERGIES:   Allergies  Allergen Reactions  . Sulfa Antibiotics Hives, Shortness Of Breath, Rash and Anaphylaxis  . Ace Inhibitors     Cough    REVIEW OF SYSTEMS:  CONSTITUTIONAL: No fever, fatigue or weakness.  EYES: No blurred or double vision.  EARS, NOSE, AND THROAT: No tinnitus or ear pain.  RESPIRATORY: Reports cough, denies shortness of breath, wheezing or  hemoptysis.  CARDIOVASCULAR: No chest pain, orthopnea, edema.  GASTROINTESTINAL: admitting nausea, vomiting, denies diarrhea or abdominal pain.  GENITOURINARY: No dysuria, hematuria.  ENDOCRINE: No polyuria, nocturia,  HEMATOLOGY: No anemia, easy bruising or bleeding SKIN: No rash or lesion. MUSCULOSKELETAL: No joint pain or arthritis.   NEUROLOGIC: No tingling, numbness, weakness.  PSYCHIATRY: No anxiety or depression.   MEDICATIONS AT HOME:   Prior to Admission medications   Medication Sig Start Date End Date Taking? Authorizing Provider  aspirin EC 81 MG tablet Take 81 mg by mouth daily.    Yes [provider]  atorvastatin (LIPITOR) 80 MG tablet Take 80 mg by mouth daily. 11/22/18  Yes [provider]  carvedilol (COREG) 12.5 MG tablet Take 12.5 mg by mouth 2 (two) times daily. 11/16/18  Yes [provider]  clopidogrel (PLAVIX) 75 MG tablet Take 75 mg by mouth daily. 01/12/18  Yes [provider]  furosemide (LASIX) 20 MG tablet Take 20 mg by mouth 2 (two) times daily. 11/13/18  Yes [provider]  gabapentin (NEURONTIN) 300 MG capsule Take 300 mg by mouth 2 (two) times daily.   Yes [provider]  insulin aspart (NOVOLOG) 100 UNIT/ML injection Inject 3-6 Units into the skin See admin instructions. Sliding scale 130-160=1unit, 161-190=2units, 191-220=3units...Marland KitchenMarland KitchenMarland Kitchenevery 30points for BS reading equal 1 unit  08/25/16  Yes [provider]  insulin detemir (LEVEMIR) 100 UNIT/ML injection Inject 50 Units into the skin at bedtime.    Yes [provider]  losartan (COZAAR) 50 MG tablet Take 50 mg by mouth daily. 11/16/18  Yes [provider]  sildenafil (VIAGRA) 100 MG tablet Take 50 mg by mouth as directed. 05/27/18  Yes [provider]  traZODone (DESYREL) 50 MG tablet Take 50-150 mg by mouth at bedtime as needed for sleep. 09/13/18  Yes [provider]  albuterol (PROVENTIL HFA;VENTOLIN HFA) 108 (90  Base) MCG/ACT inhaler Inhale 1-2 puffs into the lungs every 4 (four) hours as needed for wheezing or shortness of breath. Patient not taking: Reported on 01/12/2019 03/18/18   Barbaraann Barthel, NP  chlorpheniramine-HYDROcodone (TUSSIONEX PENNKINETIC ER) 10-8 MG/5ML SUER Take 5 mLs by mouth every 12 (twelve) hours as needed. Patient not taking: Reported on 01/12/2019 01/02/19   [name], MD  fexofenadine [facility] ALLERGY) 180 MG tablet Take 1 tablet (180 mg total) by mouth daily. 03/18/18 01/02/19  Betancourt, Jarold Song, NP  fluticasone (FLONASE) 50 MCG/ACT nasal spray Place 1 spray into both nostrils 2 (two) times daily. 03/18/18 01/02/19  Betancourt, Jarold Song, NP  ondansetron (ZOFRAN ODT) 8 MG disintegrating tablet Take 1 tablet (8 mg total) by mouth every 8 (eight) hours as needed. 01/02/19   [name], MD  pseudoephedrine (SUDAFED 12 HOUR) 120 MG 12 hr tablet Take 1 tablet (120 mg total) by mouth 2 (two) times daily. Patient not taking: Reported on 01/12/2019 03/18/18   Albina Billet A, NP  sodium chloride (OCEAN) 0.65 % SOLN nasal spray Place 2 sprays into both nostrils every 2 (two) hours while awake. 03/18/18 01/02/19  Betancourt, Jarold Song, NP      VITAL SIGNS:  Blood pressure (!) 156/89, pulse 98, temperature 98.1 F (36.7 C), temperature source Oral, resp. rate (!) 22, height  (1.803 m), weight 102.1 kg, SpO2 96 %.  PHYSICAL EXAMINATION:  GENERAL:  49 y.o.-year-old patient lying in the bed with no acute distress.  EYES: Pupils equal, round, reactive to light and accommodation. No scleral icterus. Extraocular muscles intact.  HEENT: Head atraumatic, normocephalic. Oropharynx and nasopharynx clear.  NECK:  Supple, no jugular venous distention. No thyroid enlargement, no tenderness.  LUNGS: Normal breath sounds bilaterally, no wheezing, rales,rhonchi or crepitation. No use of accessory muscles of respiration.  CARDIOVASCULAR: S1, S2 normal. No murmurs, rubs, or gallops.  ABDOMEN: Soft,  nontender, nondistended. Bowel sounds present.   EXTREMITIES: No pedal edema, cyanosis, or clubbing.  NEUROLOGIC: Awake, alert and oriented x3 sensation intact. Gait not checked.  PSYCHIATRIC: The patient is alert and oriented x 3.  SKIN: No obvious rash, lesion, or ulcer.   LABORATORY PANEL:   CBC Recent Labs  Lab 01/12/19 0430  WBC 6.0  HGB 15.3  HCT 44.4  PLT 170   ------------------------------------------------------------------------------------------------------------------  Chemistries  Recent Labs  Lab 01/12/19 0430  NA 135  K 3.5  CL 88*  CO2 23  GLUCOSE 286*  BUN 22*  CREATININE 1.22  CALCIUM 9.0  AST 40  ALT 43  ALKPHOS 87  BILITOT 2.4*   ------------------------------------------------------------------------------------------------------------------  Cardiac Enzymes No results for input(s): TROPONINI in the last 168 hours. ------------------------------------------------------------------------------------------------------------------  RADIOLOGY:  Ct Abdomen Pelvis W Contrast  Result Date: 01/12/2019 CLINICAL DATA:  Nausea, vomiting EXAM: CT ABDOMEN AND PELVIS WITH CONTRAST TECHNIQUE: Multidetector CT imaging of the abdomen and pelvis was performed using the standard protocol following bolus administration of  intravenous contrast. CONTRAST:  100mL OMNIPAQUE IOHEXOL 300 MG/ML  SOLN COMPARISON:  Ultrasound 02/13/2018 FINDINGS: Lower chest: Distal esophagus is thickened which may reflect esophagitis. Lung bases clear. No effusions. Heart is normal size. Hepatobiliary: Diffuse fatty infarcts crash that Diffuse low-density throughout the liver compatible with fatty infiltration. No focal abnormality. Gallbladder unremarkable. Pancreas: No focal abnormality or ductal dilatation. Spleen: No focal abnormality.  Normal size. Adrenals/Urinary Tract: No adrenal abnormality. No focal renal abnormality. No stones or hydronephrosis. Urinary bladder is unremarkable.  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 No complicating feature. No evidence of volvulus or obstruction. Vascular/Lymphatic: No evidence of aneurysm or adenopathy. Retroaortic left renal vein noted. Reproductive: No visible focal abnormality. Other: No free fluid or free air. Musculoskeletal: No acute bony abnormality. IMPRESSION: Fatty infiltration of the liver. Circumferential wall thickening in the visualized distal esophagus may reflect esophagitis. No acute findings in the abdomen or pelvis. Electronically Signed   By: Charlett NoseKevin  Dover M.D.   On: 01/12/2019 08:12   Dg Chest Port 1 View  Result Date: 01/12/2019 CLINICAL DATA:  Cough.  Flu like symptoms EXAM: PORTABLE CHEST 1 VIEW COMPARISON:  02/12/2018 FINDINGS: Normal heart size and mediastinal contours. No acute infiltrate or edema. No effusion or pneumothorax. No acute osseous findings. IMPRESSION: Negative chest. Electronically Signed   By: Marnee SpringJonathon  Watts M.D.   On: 01/12/2019 05:32    EKG:   Orders placed or performed during the hospital encounter of 05/14/17  . ED EKG within 10 minutes  . ED EKG within 10 minutes  . EKG 12-Lead  . EKG 12-Lead  . EKG    IMPRESSION AND PLAN:     #Acute gastroenteritis Admit to telemetry Hydrate with IV fluids Supportive treatment Protonix CT abdomen is negative  #Hypochloremic anion gap acidosis secondary to alcohol abuse ciwa IV fluids with multivitamin, thiamine and folic acid A.m. labs Outpatient alcohol Anonymous  #Diabetes mellitus Carb modified diet and sliding scale insulin  #History of fever and cough for 2 weeks covid test is done results pending Droplet precautions Chest x-ray negative    All the records are reviewed and case discussed with ED provider. Management plans discussed with the patient, he is in agreement.  CODE STATUS: fc  TOTAL TIME TAKING CARE OF THIS PATIENT: 45  minutes.   Note: This dictation was prepared with Dragon dictation along with smaller phrase technology. Any transcriptional errors that result from this process are unintentional.  Ramonita LabAruna Lillianne Eick M.D on 01/12/2019 at 3:33 PM  Between 7am to 6pm - Pager - [phone]  After 6pm go to [email or web address] - password [email or web address] [facility]  [facility]  Office  [phone]  CC: Primary care physician; [facility]

## 2019-01-13 LAB — HEMOGLOBIN A1C
Hgb A1c MFr Bld: 7.6 % — ABNORMAL HIGH (ref 4.8–5.6)
Mean Plasma Glucose: 171.42 mg/dL

## 2019-01-13 LAB — CBC
HCT: 38.7 % — ABNORMAL LOW (ref 39.0–52.0)
Hemoglobin: 13.1 g/dL (ref 13.0–17.0)
MCH: 32.2 pg (ref 26.0–34.0)
MCHC: 33.9 g/dL (ref 30.0–36.0)
MCV: 95.1 fL (ref 80.0–100.0)
Platelets: 126 10*3/uL — ABNORMAL LOW (ref 150–400)
RBC: 4.07 MIL/uL — ABNORMAL LOW (ref 4.22–5.81)
RDW: 11.8 % (ref 11.5–15.5)
WBC: 3.4 10*3/uL — ABNORMAL LOW (ref 4.0–10.5)
nRBC: 0 % (ref 0.0–0.2)

## 2019-01-13 LAB — COMPREHENSIVE METABOLIC PANEL
ALT: 47 U/L — ABNORMAL HIGH (ref 0–44)
AST: 57 U/L — ABNORMAL HIGH (ref 15–41)
Albumin: 3.8 g/dL (ref 3.5–5.0)
Alkaline Phosphatase: 70 U/L (ref 38–126)
Anion gap: 10 (ref 5–15)
BUN: 12 mg/dL (ref 6–20)
CO2: 28 mmol/L (ref 22–32)
Calcium: 8.6 mg/dL — ABNORMAL LOW (ref 8.9–10.3)
Chloride: 98 mmol/L (ref 98–111)
Creatinine, Ser: 0.79 mg/dL (ref 0.61–1.24)
GFR calc Af Amer: >60 mL/min (ref >60)
GFR calc non Af Amer: >60 mL/min (ref >60)
Glucose, Bld: 206 mg/dL — ABNORMAL HIGH (ref 70–99)
Potassium: 3.4 mmol/L — ABNORMAL LOW (ref 3.5–5.1)
Sodium: 136 mmol/L (ref 135–145)
Total Bilirubin: 1.2 mg/dL (ref 0.3–1.2)
Total Protein: 6.7 g/dL (ref 6.5–8.1)

## 2019-01-13 LAB — LIPID PANEL
Cholesterol: 166 mg/dL (ref 0–200)
HDL: 44 mg/dL (ref >40)
LDL Cholesterol: 54 mg/dL (ref 0–99)
Total CHOL/HDL Ratio: 3.8 RATIO
Triglycerides: 340 mg/dL — ABNORMAL HIGH (ref <150)
VLDL: 68 mg/dL — ABNORMAL HIGH (ref 0–40)

## 2019-01-13 LAB — GLUCOSE, CAPILLARY
Glucose-Capillary: 229 mg/dL — ABNORMAL HIGH (ref 70–99)
Glucose-Capillary: 272 mg/dL — ABNORMAL HIGH (ref 70–99)
Glucose-Capillary: 236 mg/dL — ABNORMAL HIGH (ref 70–99)

## 2019-01-13 LAB — MAGNESIUM: Magnesium: 1.9 mg/dL (ref 1.7–2.4)

## 2019-01-13 MED ORDER — INSULIN DETEMIR 100 UNIT/ML ~~LOC~~ SOLN
15.0000 [IU] | Freq: Every day | SUBCUTANEOUS | Status: DC
  Administered 2019-01-13: 15 [IU] via SUBCUTANEOUS
  Filled 2019-01-13 (×2): qty 0.15

## 2019-01-13 MED ORDER — HYDROCOD POLST-CPM POLST ER 10-8 MG/5ML PO SUER: 5.0000 mL | Freq: Two times a day (BID) | ORAL | Status: DC | PRN

## 2019-01-13 MED ORDER — LOSARTAN POTASSIUM 50 MG PO TABS
50.0000 mg | ORAL_TABLET | Freq: Every day | ORAL | Status: DC
  Administered 2019-01-13 – 2019-01-14 (×2): 50 mg via ORAL
  Filled 2019-01-13 (×2): qty 1

## 2019-01-13 MED ORDER — CARVEDILOL 12.5 MG PO TABS
12.5000 mg | ORAL_TABLET | Freq: Two times a day (BID) | ORAL | Status: DC
  Administered 2019-01-13 (×2): 12.5 mg via ORAL
  Filled 2019-01-13 (×2): qty 1

## 2019-01-13 MED ORDER — POTASSIUM CHLORIDE CRYS ER 20 MEQ PO TBCR
40.0000 meq | EXTENDED_RELEASE_TABLET | Freq: Once | ORAL | Status: AC
  Administered 2019-01-13: 40 meq via ORAL
  Filled 2019-01-13: qty 2

## 2019-01-13 MED ORDER — SODIUM CHLORIDE 0.9 % IV SOLN
INTRAVENOUS | Status: DC
  Administered 2019-01-13 – 2019-01-14 (×2): via INTRAVENOUS

## 2019-01-13 MED ORDER — ASPIRIN EC 81 MG PO TBEC
81.0000 mg | DELAYED_RELEASE_TABLET | Freq: Every day | ORAL | Status: DC
  Administered 2019-01-13 – 2019-01-14 (×2): 81 mg via ORAL
  Filled 2019-01-13 (×2): qty 1

## 2019-01-13 MED ORDER — GABAPENTIN 300 MG PO CAPS
300.0000 mg | ORAL_CAPSULE | Freq: Two times a day (BID) | ORAL | Status: DC
  Filled 2019-01-13 (×3): qty 1

## 2019-01-13 MED ORDER — TRAZODONE HCL 50 MG PO TABS
50.0000 mg | ORAL_TABLET | Freq: Every evening | ORAL | Status: DC | PRN
  Administered 2019-01-13: 50 mg via ORAL
  Filled 2019-01-13: qty 1

## 2019-01-13 NOTE — Progress Notes (Signed)
Pt feels well. Up and about in room. ciwa score 0. No complaints of pain. No gastro intestinal upset. Tele nsr. req coverage for fsbs. At each meal

## 2019-01-13 NOTE — Progress Notes (Signed)
[facility] -  at [facility]   PATIENT NAME: Franklin BullocksDerek Joos    MR#:  [id number]  DATE OF BIRTH:  01/28/1971  SUBJECTIVE:  CHIEF COMPLAINT:   Chief Complaint  Patient presents with   Nausea   Cough  Patient  presents w/ 3 d hx of of cough, chills, diarrhea, nausea and vomiting.  Patient denies any new episodes of fever, chills,  diarrhea. He however still reports lingering non-productive cough. COVID 19 pending  REVIEW OF SYSTEMS:  Review of Systems  Constitutional: Negative for chills and fever.  HENT: Negative for sore throat.   Respiratory: Positive for cough. Negative for shortness of breath and wheezing.   Cardiovascular: Negative for chest pain and leg swelling.  Gastrointestinal: Positive for nausea. Negative for abdominal pain, diarrhea and vomiting.  Genitourinary: Negative for dysuria.  Musculoskeletal: Negative for back pain and myalgias.  Neurological: Negative for dizziness, sensory change, speech change, focal weakness and headaches.   DRUG ALLERGIES:   Allergies  Allergen Reactions   Sulfa Antibiotics Hives, Shortness Of Breath, Rash and Anaphylaxis   Ace Inhibitors     Cough    VITALS:  Blood pressure (!) 162/94, pulse 85, temperature 98.3 F (36.8 C), resp. rate 16, height 5\' 11"  (1.803 m), weight 102.1 kg, SpO2 95 %.  PHYSICAL EXAMINATION:  Physical Exam  GENERAL:  48 y.o.-year-old patient lying in the bed with no acute distress.  EYES: Pupils equal, round, reactive to light and accommodation. No scleral icterus. Extraocular muscles intact.  HEENT: Head atraumatic, normocephalic. Oropharynx and nasopharynx clear.  NECK:  Supple, no jugular venous distention. No thyroid enlargement, no tenderness.  LUNGS: Normal breath sounds bilaterally, no wheezing, rales,rhonchi or crepitation. No use of accessory muscles of respiration.  CARDIOVASCULAR: S1, S2 normal. No murmurs, rubs, or gallops.  ABDOMEN: Soft, nontender,  nondistended. Bowel sounds present. No organomegaly or mass.  EXTREMITIES: No pedal edema, cyanosis, or clubbing.  NEUROLOGIC: Cranial nerves II through XII are intact. Muscle strength 5/5 in all extremities. Sensation intact. Gait not checked.  PSYCHIATRIC: The patient is alert and oriented x 3.  SKIN: No obvious rash, lesion, or ulcer.   LABORATORY PANEL:   CBC Recent Labs  Lab 01/13/19 0458  WBC 3.4*  HGB 13.1  HCT 38.7*  PLT 126*   ------------------------------------------------------------------------------------------------------------------  Chemistries  Recent Labs  Lab 01/13/19 0458  NA 136  K 3.4*  CL 98  CO2 28  GLUCOSE 206*  BUN 12  CREATININE 0.79  CALCIUM 8.6*  MG 1.9  AST 57*  ALT 47*  ALKPHOS 70  BILITOT 1.2   ------------------------------------------------------------------------------------------------------------------  Cardiac Enzymes No results for input(s): TROPONINI in the last 168 hours. ------------------------------------------------------------------------------------------------------------------  RADIOLOGY:  Ct Abdomen Pelvis W Contrast  Result Date: 01/12/2019 CLINICAL DATA:  Nausea, vomiting EXAM: CT ABDOMEN AND PELVIS WITH CONTRAST TECHNIQUE: Multidetector CT imaging of the abdomen and pelvis was performed using the standard protocol following bolus administration of intravenous contrast. CONTRAST:  100mL OMNIPAQUE IOHEXOL 300 MG/ML  SOLN COMPARISON:  Ultrasound 02/13/2018 FINDINGS: Lower chest: Distal esophagus is thickened which may reflect esophagitis. Lung bases clear. No effusions. Heart is normal size. Hepatobiliary: Diffuse fatty infarcts crash that Diffuse low-density throughout the liver compatible with fatty infiltration. No focal abnormality. Gallbladder unremarkable. Pancreas: No focal abnormality or ductal dilatation. Spleen: No focal abnormality.  Normal size. Adrenals/Urinary Tract: No adrenal abnormality. No focal renal  abnormality. No stones or hydronephrosis. Urinary bladder is unremarkable. Stomach/Bowel: A component of malrotation noted with  the small bowel in the right abdomen and large bowel in the left abdomen. No complicating feature. No evidence of volvulus or obstruction. Vascular/Lymphatic: No evidence of aneurysm or adenopathy. Retroaortic left renal vein noted. Reproductive: No visible focal abnormality. Other: No free fluid or free air. Musculoskeletal: No acute bony abnormality. IMPRESSION: Fatty infiltration of the liver. Circumferential wall thickening in the visualized distal esophagus may reflect esophagitis. No acute findings in the abdomen or pelvis. Electronically Signed   By: Charlett Nose M.D.   On: 01/12/2019 08:12   Dg Chest Port 1 View  Result Date: 01/12/2019 CLINICAL DATA:  Cough.  Flu like symptoms EXAM: PORTABLE CHEST 1 VIEW COMPARISON:  02/12/2018 FINDINGS: Normal heart size and mediastinal contours. No acute infiltrate or edema. No effusion or pneumothorax. No acute osseous findings. IMPRESSION: Negative chest. Electronically Signed   By: Marnee Spring M.D.   On: 01/12/2019 05:32    EKG:   Orders placed or performed during the hospital encounter of 05/14/17   ED EKG within 10 minutes   ED EKG within 10 minutes   EKG 12-Lead   EKG 12-Lead   EKG    ASSESSMENT AND PLAN:   48 y.o. male with PMH of DM, HTN, CAD, alcohol abuse, and diverticulosis who presents w/ 3 d hx of of cough, chills, diarrhea, nausea and vomiting. COVID test pending.   #1. Acute gastroenteritis- - Supportive treatment- much improved now - Diet advancing as tolerated - IVF maintenance  -  IV/PO PRN pain  management - Ondansetron PRN nausea/vomiting - Protonix GI prophylaxis - since SARS2- corona virus can present with GI symptoms- COVID 19 ordered and is pending  2. Hepatic steatosis -likely alcohol induced in a patient with history of alcohol abuse. - CT abdomen and pelvis shows fatty infiltration  of the liver and esophagitis - Check lipid panel to exclude hyper-triglyceridemia  3. ETOH abuse - EtOH Cessation counseling as appropriate - Daily Thiamine, Folate, MVI replacement - CIWA +/- Standing Protocol  4. Diabetes mellitus- on levemir and sliding scale  -adjust doses as sugars elevated -Hemoglobin A1c 7.6   5. Hypokalemia-on replacement   6. Elevated liver function test-secondary to alcohol abuse, but also ruling out COVID  7. History of fever and cough for 2 weeks -Covid test is done results pending -Droplet precautions -Chest x-ray negative  8. DVT prophylaxis - Lovenox subcutaneous   All the records are reviewed and case discussed with Care Management/Social Workerr. Management plans discussed with the patient, family and they are in agreement.  CODE STATUS: FULL   TOTAL TIME TAKING CARE OF THIS PATIENT: 38 minutes.   POSSIBLE D/C IN 1-2 DAYS, DEPENDING ON CLINICAL CONDITION.   Loraine Leriche M.D on 01/13/2019 at 8:30 AM  Between 7am to 6pm - Pager - [phone]  After 6pm go to [email or web address] - Social research officer, government  [facility] [place] [facility]  Office  [phone]  CC: Primary care physician; [facility] [place] [facility]

## 2019-01-13 NOTE — Progress Notes (Signed)
Inpatient Diabetes Program Recommendations  AACE/ADA: New Consensus Statement on Inpatient Glycemic Control  Target Ranges:  Prepandial:   less than 140 mg/dL      Peak postprandial:   less than 180 mg/dL (1-2 hours)      Critically ill patients:  140 - 180 mg/dL   Results for Franklin Douglas, Franklin Douglas (MRN [id number]) as of 01/13/2019 08:48  Ref. Range 01/12/2019 13:35 01/12/2019 15:31 01/12/2019 16:27 01/12/2019 19:42 01/12/2019 21:31  Glucose-Capillary Latest Ref Range: 70 - 99 mg/dL 888 (H) 280 (H) 034 (H) 273 (H) 246 (H)   Review of Glycemic Control  Diabetes history: DM2 Outpatient Diabetes medications: Levemir 50 units QHS, Novolog 3-6 units Current orders for Inpatient glycemic control: Novolog 0-9 units TID with meals, Novolog 0-5 units QHS  Inpatient Diabetes Program Recommendations:   Insulin-Basal: Please consider ordering Levemir 15 units daily (starting this morning).  Thanks, Orlando Penner, RN, MSN, CDE Diabetes Coordinator Inpatient Diabetes Program [phone] (Team Pager from 8am to 5pm)

## 2019-01-14 LAB — COMPREHENSIVE METABOLIC PANEL
ALT: 53 U/L — ABNORMAL HIGH (ref 0–44)
AST: 51 U/L — ABNORMAL HIGH (ref 15–41)
Albumin: 4.2 g/dL (ref 3.5–5.0)
Alkaline Phosphatase: 75 U/L (ref 38–126)
Anion gap: 9 (ref 5–15)
BUN: 11 mg/dL (ref 6–20)
CO2: 27 mmol/L (ref 22–32)
Calcium: 9 mg/dL (ref 8.9–10.3)
Chloride: 100 mmol/L (ref 98–111)
Creatinine, Ser: 0.79 mg/dL (ref 0.61–1.24)
GFR calc Af Amer: >60 mL/min (ref >60)
GFR calc non Af Amer: >60 mL/min (ref >60)
Glucose, Bld: 210 mg/dL — ABNORMAL HIGH (ref 70–99)
Potassium: 3.5 mmol/L (ref 3.5–5.1)
Sodium: 136 mmol/L (ref 135–145)
Total Bilirubin: 1.1 mg/dL (ref 0.3–1.2)
Total Protein: 7 g/dL (ref 6.5–8.1)

## 2019-01-14 LAB — GLUCOSE, CAPILLARY: Glucose-Capillary: 138 mg/dL — ABNORMAL HIGH (ref 70–99)

## 2019-01-14 MED ORDER — CARVEDILOL 25 MG PO TABS
25.0000 mg | ORAL_TABLET | Freq: Two times a day (BID) | ORAL | Status: DC
  Administered 2019-01-14: 25 mg via ORAL
  Filled 2019-01-14: qty 1

## 2019-01-14 NOTE — Progress Notes (Signed)
Discharge instructions and med details reviewed with patient. Printed AVS given to pt. Pt removed IV cath. Site clean and dry. Pt escorted out via wheelchair.

## 2019-01-14 NOTE — Plan of Care (Signed)
  Problem: Education: Goal: Knowledge of General Education information will improve Description: Including pain rating scale, medication(s)/side effects and non-pharmacologic comfort measures Outcome: Progressing   Problem: Clinical Measurements: Goal: Respiratory complications will improve Outcome: Progressing   

## 2019-01-14 NOTE — Discharge Summary (Signed)
[facility] - [place] [facility]   PATIENT NAME: Franklin Douglas    MR#:  [id number]  DATE OF BIRTH:  June 19, 1971  DATE OF ADMISSION:  01/12/2019   ADMITTING PHYSICIAN: Ramonita Lab, MD  DATE OF DISCHARGE: No discharge date for patient encounter.  PRIMARY CARE PHYSICIAN: Care, [facility]   ADMISSION DIAGNOSIS:   High anion gap metabolic acidosis [E87.2] Alcohol dependence with withdrawal with complication (HCC) [F10.239]  DISCHARGE DIAGNOSIS:   Active Problems:   Metabolic acidosis, increased anion gap (IAG)   SECONDARY DIAGNOSIS:   Past Medical History:  Diagnosis Date  . Diabetes mellitus without complication (HCC)   . Diabetic neuropathy (HCC)   . Diverticula, colon   . Diverticulitis   . Sciatica     HOSPITAL COURSE:  48 y.o. male with PMH of DM, HTN, CAD, alcohol abuse, and diverticulosis who presents w/ 3 d hx of of cough, chills, diarrhea, nausea and vomiting. COVID test pending.   #1. Acute gastroenteritis-Improved on with supportive therapy/IVFs - Continue Protonix GI prophylaxis - since SARS2- corona virus can present with GI symptoms- COVID 19 ordered and is pending -Patient instructed to self quarantine at discharge until rule out for COVID 19  2. Hepatic steatosis -likely alcohol induced in a patient with history of alcohol abuse. - CT abdomen and pelvis shows fatty infiltration of the liver and esophagitis -  lipid panel checked to exclude hyper-triglyceridemia, triglycerides slightly elevated but improved from previous.  3. ETOH abuse -no signs of alcohol withdrawal - EtOH Cessation counseling provided for 9 minutes -Treated with Daily Thiamine, Folate, MVI replacement  4. Diabetes mellitus- on levemir and sliding scale at home -Continue home dose insulin -Hemoglobin A1c 7.6  5. Hypokalemia-improved with replacement  6. Elevated liver function test-secondary to alcohol abuse, but also ruling out COVID  7. History  of fever and cough for 2 weeks -Covidtest is done results pending, patient was placed on isolation -Chest x-ray negative  8. Hypertension - slightly elevated during admission - Continue home dose Coreg and Cozaar at home dose  DISCHARGE CONDITIONS:   Stable CONSULTS OBTAINED:     DRUG ALLERGIES:   Allergies  Allergen Reactions  . Sulfa Antibiotics Hives, Shortness Of Breath, Rash and Anaphylaxis  . Ace Inhibitors     Cough   DISCHARGE MEDICATIONS:   Allergies as of 01/14/2019      Reactions   Sulfa Antibiotics Hives, Shortness Of Breath, Rash, Anaphylaxis   Ace Inhibitors    Cough      Medication List    STOP taking these medications   pseudoephedrine 120 MG 12 hr tablet Commonly known as:  Sudafed 12 Hour     TAKE these medications   albuterol 108 (90 Base) MCG/ACT inhaler Commonly known as:  PROVENTIL HFA;VENTOLIN HFA Inhale 1-2 puffs into the lungs every 4 (four) hours as needed for wheezing or shortness of breath.   aspirin EC 81 MG tablet Take 81 mg by mouth daily.   atorvastatin 80 MG tablet Commonly known as:  LIPITOR Take 80 mg by mouth daily.   carvedilol 12.5 MG tablet Commonly known as:  COREG Take 12.5 mg by mouth 2 (two) times daily.   chlorpheniramine-HYDROcodone 10-8 MG/5ML Suer Commonly known as:  Tussionex Pennkinetic ER Take 5 mLs by mouth every 12 (twelve) hours as needed.   clopidogrel 75 MG tablet Commonly known as:  PLAVIX Take 75 mg by mouth daily.   fexofenadine 180 MG tablet Commonly known as:  Allegra Allergy Take 1 tablet (180 mg total) by mouth daily.   fluticasone 50 MCG/ACT nasal spray Commonly known as:  FLONASE Place 1 spray into both nostrils 2 (two) times daily.   furosemide 20 MG tablet Commonly known as:  LASIX Take 20 mg by mouth 2 (two) times daily.   gabapentin 300 MG capsule Commonly known as:  NEURONTIN Take 300 mg by mouth 2 (two) times daily.   insulin detemir 100 UNIT/ML injection Commonly known  as:  LEVEMIR Inject 50 Units into the skin at bedtime.   losartan 50 MG tablet Commonly known as:  COZAAR Take 50 mg by mouth daily.   NovoLOG 100 UNIT/ML injection Generic drug:  insulin aspart Inject 3-6 Units into the skin See admin instructions. Sliding scale 130-160=1unit, 161-190=2units, 191-220=3units...Marland KitchenMarland KitchenMarland Kitchenevery 30points for BS reading equal 1 unit   ondansetron 8 MG disintegrating tablet Commonly known as:  Zofran ODT Take 1 tablet (8 mg total) by mouth every 8 (eight) hours as needed.   sildenafil 100 MG tablet Commonly known as:  VIAGRA Take 50 mg by mouth as directed.   sodium chloride 0.65 % Soln nasal spray Commonly known as:  OCEAN Place 2 sprays into both nostrils every 2 (two) hours while awake.   traZODone 50 MG tablet Commonly known as:  DESYREL Take 50-150 mg by mouth at bedtime as needed for sleep.        DISCHARGE INSTRUCTIONS:    DIET:   Diabetic diet and Low fat, Low cholesterol diet  ACTIVITY:   Activity as tolerated  OXYGEN:   Home Oxygen: No.  Oxygen Delivery: room air  DISCHARGE LOCATION:   home   If you experience worsening of your admission symptoms, develop shortness of breath, life threatening emergency, suicidal or homicidal thoughts you must seek medical attention immediately by calling 911 or calling your MD immediately  if symptoms less severe.  You Must read complete instructions/literature along with all the possible adverse reactions/side effects for all the Medicines you take and that have been prescribed to you. Take any new Medicines after you have completely understood and accpet all the possible adverse reactions/side effects.   Please note  You were cared for by a hospitalist during your hospital stay. If you have any questions about your discharge medications or the care you received while you were in the hospital after you are discharged, you can call the unit and asked to speak with the hospitalist on call if the  hospitalist that took care of you is not available. Once you are discharged, your primary care physician will handle any further medical issues. Please note that NO REFILLS for any discharge medications will be authorized once you are discharged, as it is imperative that you return to your primary care physician (or establish a relationship with a primary care physician if you do not have one) for your aftercare needs so that they can reassess your need for medications and monitor your lab values.    On the day of Discharge:  VITAL SIGNS:   Blood pressure (!) 144/93, pulse 70, temperature 98.7 F (37.1 C), resp. rate 17, height  (1.803 m), weight 102.1 kg, SpO2 98 %.  PHYSICAL EXAMINATION:    GENERAL:  48 y.o.-year-old patient lying in the bed with no acute distress.  EYES: Pupils equal, round, reactive to light and accommodation. No scleral icterus. Extraocular muscles intact.  HEENT: Head atraumatic, normocephalic. Oropharynx and nasopharynx clear.  NECK:  Supple, no jugular venous distention. No  thyroid enlargement, no tenderness.  LUNGS: Normal breath sounds bilaterally, no wheezing, rales,rhonchi or crepitation. No use of accessory muscles of respiration.  CARDIOVASCULAR: S1, S2 normal. No murmurs, rubs, or gallops.  ABDOMEN: Soft, non-tender, non-distended. Bowel sounds present. No organomegaly or mass.  EXTREMITIES: No pedal edema, cyanosis, or clubbing.  NEUROLOGIC: Cranial nerves II through XII are intact. Muscle strength 5/5 in all extremities. Sensation intact. Gait not checked.  PSYCHIATRIC: The patient is alert and oriented x 3.  SKIN: No obvious rash, lesion, or ulcer.   DATA REVIEW:   CBC Recent Labs  Lab 01/13/19 0458  WBC 3.4*  HGB 13.1  HCT 38.7*  PLT 126*    Chemistries  Recent Labs  Lab 01/13/19 0458 01/14/19 0437  NA 136 136  K 3.4* 3.5  CL 98 100  CO2 28 27  GLUCOSE 206* 210*  BUN 12 11  CREATININE 0.79 0.79  CALCIUM 8.6* 9.0  MG 1.9  --    AST 57* 51*  ALT 47* 53*  ALKPHOS 70 75  BILITOT 1.2 1.1     Microbiology Results  Results for orders placed or performed during the hospital encounter of 01/12/19  MRSA PCR Screening     Status: None   Collection Time: 01/12/19  4:31 PM  Result Value Ref Range Status   MRSA by PCR NEGATIVE NEGATIVE Final    Comment:        The GeneXpert MRSA Assay (FDA approved for NASAL specimens only), is one component of a comprehensive MRSA colonization surveillance program. It is not intended to diagnose MRSA infection nor to guide or monitor treatment for MRSA infections. Performed at [facility], [address] [place], [place] [address]     RADIOLOGY:  No results found.   Management plans discussed with the patient, family and they are in agreement.  CODE STATUS:     Code Status Orders  (From admission, onward)         Start     Ordered   01/12/19 0958  Full code  Continuous     01/12/19 1002        Code Status History    Date Active Date Inactive Code Status Order ID Comments User Context   02/13/2018 0222 02/13/2018 2132 Full Code [id number]  Cammy CopaMaier, Angela, MD Inpatient      TOTAL TIME TAKING CARE OF THIS PATIENT: 37 minutes.    Webb SilversmithElizabeth Farren Landa NP on 01/14/2019 at 11:16 AM  Between 7am to 6pm - Pager - [phone]  After 6pm go to [email or web address] - Social research officer, governmentpassword [email or web address]  [facility]  Office  [phone]  CC: Primary care physician; [facility]   Note: This dictation was prepared with Dragon dictation along with smaller phrase technology. Any transcriptional errors that result from this process are unintentional.

## 2019-01-14 NOTE — Discharge Instructions (Signed)
Alcoholic Liver Disease  Alcoholic liver disease refers to liver damage that is caused by drinking a lot of alcohol over a long period of time. The liver is an organ that:   Helps to divide (break down) and absorb fats and other nutrients in the blood.   Helps to remove harmful substances from the blood.   Makes the parts of the blood that help to form clots and prevent excessive bleeding.  Damage may cause the liver to stop working properly.  There are three main types of alcoholic liver disease, and they usually occur in the following order.  1. Fatty liver disease. This is considered the early stage of alcoholic liver disease. It is a condition in which too much fat has built up in the liver cells. In many cases, fatty liver disease does not cause any symptoms. It is often diagnosed when lab tests are done for other reasons.  2. Alcoholic hepatitis. This is liver inflammation that decreases the liver's ability to function normally.  3. Alcoholic cirrhosis. Cirrhosis is long-term (chronic) liver injury. Having cirrhosis means that many healthy liver cells have been replaced by scar tissue. This prevents blood from flowing through the liver, which makes it difficult for the liver to function.  Symptoms of this condition usually get worse (progress) over time if alcohol use continues. Treatment requires stopping all alcohol intake.  What are the causes?  Alcoholic liver disease is caused by long-term (chronic) heavy alcohol use.  The liver filters alcohol out of the bloodstream. When alcohol gets broken down in the liver, it releases poisonous (toxic) chemicals that damage liver cells.  What increases the risk?  This condition is more likely to develop in:   Women.   People who have a family history of alcoholic liver disease.   People who have poor nutrition.   People who are obese.   People who drink a lot of alcohol, especially people who often drink a lot during short periods of time (binge  drink).   People who have an underlying liver disease such as hepatitis B or hepatitis C.   People who lack certain nutrients, such as folate or thiamine (have nutrient deficiencies).  What are the signs or symptoms?  Most people do not have symptoms in the early stages of this disease. If early symptoms do develop, they may include:   Loss of appetite.   Nausea and vomiting.  Symptoms of moderate disease include:   Loss of appetite.   Nausea and vomiting.   Diarrhea.   Weight loss without trying.   Fatigue.   Yellowing of the skin and the whites of the eyes (jaundice).   Pain and swelling in the abdomen.   Fever.  Symptoms of advanced disease include:   Weight loss and muscle loss.   Itchy skin.   Buildup of fluid in the abdomen (ascites).   Swelling of the feet and ankles (edema).   Nosebleeds or bleeding gums.   Bloody bowel movements.   Enlarged fingertips (clubbing).   Trouble concentrating.   Trouble sleeping.   Mood changes.   Agitation.   Confusion.  Some people do not have symptoms until the condition becomes severe. Symptoms often get worse right after a period of heavy drinking.  How is this diagnosed?  This condition may be diagnosed based on:   A physical exam.   Blood tests.   Tests that create detailed images of the body. These may include:  ? Liver ultrasound.  ? CT   scan.  ? MRI.   A liver biopsy. For this test, a small sample of liver tissue is removed and checked for signs of damage.  How is this treated?  The most important part of treatment is to stop drinking alcohol. If you are addicted to alcohol, your health care provider will help you make a plan to quit. This plan may involve:   Taking medicine to decrease unpleasant symptoms that are caused by stopping or decreasing alcohol use (withdrawal symptoms).   Entering a treatment program to help you stop drinking.   Joining a support group.  Treatment for alcoholic liver disease may also include:   Nutritional  therapy. Your health care provider or a diet and nutrition specialist (dietitian) may recommend:  ? Eating a healthy diet.  ? Taking vitamins.  ? Eating foods that contain a lot of zinc or B vitamins such as folate, thiamine, or pyridoxine.   Steroid medicines to reduce liver inflammation. These may be recommended if your disease is severe.   Receiving a donated liver (liver transplant). This is only done in very severe cases, and only for people who have completely stopped drinking and can commit to never drinking alcohol again.  Follow these instructions at home:     Do not drink alcohol. Follow your treatment plan, and work with your health care provider as needed.   Consider joining an alcohol support group. These groups can provide emotional support and guidance.   Take over-the-counter and prescription medicines only as told by your health care provider. These include vitamins and supplements.   Do not use medicines or eat foods that contain alcohol unless told by your health care provider.   Follow instructions from your health care provider or dietitian about eating a healthy diet.   Keep all follow-up visits as told by your health care provider. This is important.  Contact a health care provider if:   You develop a fever.   Your skin color becomes more yellow, pale, or dark.   You develop headaches.  Get help right away if:   You vomit blood.   You have bright red blood in your stool.   You have black, tarry stools.   You have trouble:  ? Thinking.  ? Walking.  ? Balancing.  ? Breathing.  Summary   Alcoholic liver disease refers to liver damage that is caused by drinking a lot of alcohol over a long period of time.   Symptoms of this condition usually get worse (progress) over time if alcohol use continues.   Your health care provider will do a physical exam and tests to diagnose your condition.   The most important part of treatment is to stop drinking alcohol. Follow your treatment  plan, and work with your health care provider as needed.  This information is not intended to replace advice given to you by your health care provider. Make sure you discuss any questions you have with your health care provider.  Document Released: 10/12/2014 Document Revised: 06/03/2017 Document Reviewed: 06/03/2017  Elsevier Interactive Patient Education  2019 Elsevier Inc.

## 2019-01-14 NOTE — Progress Notes (Signed)
Pt anxious and agitated about waiting to be DC. Pt also verbally aggressive towards staff. MD paged. Waiting on DC orders

## 2019-01-15 LAB — NOVEL CORONAVIRUS, NAA (HOSP ORDER, SEND-OUT TO REF LAB; TAT 18-24 HRS): SARS-CoV-2, NAA: NOT DETECTED

## 2019-01-17 ENCOUNTER — Telehealth: Payer: Self-pay | Admitting: Emergency Medicine

## 2019-01-17 NOTE — Telephone Encounter (Signed)
Called patient and informed him of negative covid 19 test. 

## 2019-06-06 ENCOUNTER — Ambulatory Visit
Admission: EM | Admit: 2019-06-06 | Discharge: 2019-06-06 | Disposition: A | Discharge status: Home / Self Care | Payer: BC Managed Care – PPO | Attending: Family Medicine | Admitting: Family Medicine

## 2019-06-06 ENCOUNTER — Other Ambulatory Visit: Payer: Self-pay

## 2019-06-06 ENCOUNTER — Encounter: Payer: Self-pay | Admitting: Emergency Medicine

## 2019-06-06 DIAGNOSIS — R112 Nausea with vomiting, unspecified: Secondary | ICD-10-CM | POA: Diagnosis not present

## 2019-06-06 LAB — CBC WITH DIFFERENTIAL/PLATELET
Abs Immature Granulocytes: 0.01 10*3/uL (ref 0.00–0.07)
Basophils Absolute: 0 10*3/uL (ref 0.0–0.1)
Basophils Relative: 0 %
Eosinophils Absolute: 0.1 10*3/uL (ref 0.0–0.5)
Eosinophils Relative: 1 %
HCT: 44.8 % (ref 39.0–52.0)
Hemoglobin: 15.6 g/dL (ref 13.0–17.0)
Immature Granulocytes: 0 %
Lymphocytes Relative: 21 %
Lymphs Abs: 1 10*3/uL (ref 0.7–4.0)
MCH: 32.8 pg (ref 26.0–34.0)
MCHC: 34.8 g/dL (ref 30.0–36.0)
MCV: 94.3 fL (ref 80.0–100.0)
Monocytes Absolute: 0.4 10*3/uL (ref 0.1–1.0)
Monocytes Relative: 8 %
Neutro Abs: 3.5 10*3/uL (ref 1.7–7.7)
Neutrophils Relative %: 70 %
Platelets: 208 10*3/uL (ref 150–400)
RBC: 4.75 MIL/uL (ref 4.22–5.81)
RDW: 13 % (ref 11.5–15.5)
WBC: 5 10*3/uL (ref 4.0–10.5)
nRBC: 0 % (ref 0.0–0.2)

## 2019-06-06 LAB — COMPREHENSIVE METABOLIC PANEL
ALT: 47 U/L — ABNORMAL HIGH (ref 0–44)
AST: 35 U/L (ref 15–41)
Albumin: 4.4 g/dL (ref 3.5–5.0)
Alkaline Phosphatase: 84 U/L (ref 38–126)
Anion gap: 12 (ref 5–15)
BUN: 19 mg/dL (ref 6–20)
CO2: 24 mmol/L (ref 22–32)
Calcium: 9.5 mg/dL (ref 8.9–10.3)
Chloride: 99 mmol/L (ref 98–111)
Creatinine, Ser: 0.9 mg/dL (ref 0.61–1.24)
GFR calc Af Amer: >60 mL/min (ref >60)
GFR calc non Af Amer: >60 mL/min (ref >60)
Glucose, Bld: 181 mg/dL — ABNORMAL HIGH (ref 70–99)
Potassium: 4.1 mmol/L (ref 3.5–5.1)
Sodium: 135 mmol/L (ref 135–145)
Total Bilirubin: 0.6 mg/dL (ref 0.3–1.2)
Total Protein: 7.9 g/dL (ref 6.5–8.1)

## 2019-06-06 LAB — LIPASE, BLOOD: Lipase: 28 U/L (ref 11–51)

## 2019-06-06 MED ORDER — ONDANSETRON 4 MG PO TBDP: 4.0000 mg | ORAL_TABLET | Freq: Three times a day (TID) | ORAL | 0 refills | Status: DC | PRN

## 2019-06-06 NOTE — ED Provider Notes (Signed)
[facility]    CSN: [id number] Arrival date & time: 06/06/19  0845  History   Chief Complaint Chief Complaint  Patient presents with  . Emesis  . work note   HPI  48 year old male presents with nausea and vomiting.  Patient states that he developed nausea and vomiting over the weekend.  Started after he drank liquor over the weekend.  He is a known alcoholic.  Drinks 6-8 alcoholic beverages a day.  Patient reports that he had 4 episodes of emesis.  Hematemesis.  He is currently feeling improved and was able to keep down food and liquids this morning.  No fever.  No abdominal pain.  Patient knows that he needs to cut back on his alcohol intake.  No medications taken.  No other associated symptoms.  No other complaints concerns at this time.  PMH, Surgical Hx, Family Hx, Social History reviewed and updated as below.  Past Medical History:  Diagnosis Date  . Diabetes mellitus without complication (HCC)   . Diabetic neuropathy (HCC)   . Diverticula, colon   . Diverticulitis   . Sciatica    Patient Active Problem List   Diagnosis Date Noted  . Metabolic acidosis, increased anion gap (IAG) 01/12/2019  . Hematemesis 02/13/2018  . AA (alcohol abuse) 12/02/2015  . 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 Diabetes mellitus (HCC) 05/12/2013  . BP (high blood pressure) 05/12/2013  . Pure hypercholesterolemia 05/12/2013   Past Surgical History:  Procedure Laterality Date  . CARDIAC SURGERY    . COLON SURGERY    . HERNIA REPAIR     Home Medications    Prior to Admission medications   Medication Sig Start Date End Date Taking? Authorizing Provider  aspirin EC 81 MG tablet Take 81 mg by mouth daily.    Yes [provider]  atorvastatin (LIPITOR) 80 MG tablet Take 80 mg by mouth daily. 11/22/18  Yes [provider]  carvedilol (COREG) 12.5 MG tablet Take 12.5 mg by mouth 2 (two) times daily. 11/16/18  Yes [provider]  clopidogrel (PLAVIX) 75  MG tablet Take 75 mg by mouth daily. 01/12/18  Yes [provider]  fluticasone (FLONASE) 50 MCG/ACT nasal spray Place 1 spray into both nostrils 2 (two) times daily. 03/18/18 06/06/19 Yes Betancourt, Jarold Song, NP  furosemide (LASIX) 20 MG tablet Take 20 mg by mouth 2 (two) times daily. 11/13/18  Yes [provider]  insulin aspart (NOVOLOG) 100 UNIT/ML injection Inject 3-6 Units into the skin See admin instructions. Sliding scale 130-160=1unit, 161-190=2units, 191-220=3units...Marland KitchenMarland KitchenMarland Kitchenevery 30points for BS reading equal 1 unit 08/25/16  Yes [provider]  insulin detemir (LEVEMIR) 100 UNIT/ML injection Inject 50 Units into the skin at bedtime.    Yes [provider]  sildenafil (VIAGRA) 100 MG tablet Take 50 mg by mouth as directed. 05/27/18  Yes [provider]  ondansetron (ZOFRAN-ODT) 4 MG disintegrating tablet Take 1 tablet (4 mg total) by mouth every 8 (eight) hours as needed for nausea or vomiting. 06/06/19   Tommie Sams, DO  sodium chloride (OCEAN) 0.65 % SOLN nasal spray Place 2 sprays into both nostrils every 2 (two) hours while awake. 03/18/18 01/02/19  Betancourt, Jarold Song, NP  albuterol (PROVENTIL HFA;VENTOLIN HFA) 108 (90 Base) MCG/ACT inhaler Inhale 1-2 puffs into the lungs every 4 (four) hours as needed for wheezing or shortness of breath. Patient not taking: Reported on 01/12/2019 03/18/18 06/06/19  Barbaraann Barthel, NP  fexofenadine [facility] ALLERGY) 180 MG tablet  Take 1 tablet (180 mg total) by mouth daily. 03/18/18 06/06/19  Betancourt, Jarold Songina A, NP  gabapentin (NEURONTIN) 300 MG capsule Take 300 mg by mouth 2 (two) times daily.  06/06/19  [provider]  losartan (COZAAR) 50 MG tablet Take 50 mg by mouth daily. 11/16/18 06/06/19  [provider]  traZODone (DESYREL) 50 MG tablet Take 50-150 mg by mouth at bedtime as needed for sleep. 09/13/18 06/06/19  [provider]    Family History Family History  Problem Relation Age of Onset  .  Diabetes Mother   . Diabetes Brother     Social History Social History   Tobacco Use  . Smoking status: Never Smoker  . Smokeless tobacco: Never Used  Substance Use Topics  . Alcohol use: Yes    Alcohol/week: 20.0 standard drinks    Types: 20 Shots of liquor per week    Comment: 8 drinks/ day  . Drug use: No     Allergies   Sulfa antibiotics and Ace inhibitors   Review of Systems Review of Systems  Constitutional: Positive for appetite change. Negative for fever.  Gastrointestinal: Positive for nausea and vomiting. Negative for abdominal pain.   Physical Exam Triage Vital Signs ED Triage Vitals  Enc Vitals Group     BP 06/06/19 0906 (!) 148/97     Pulse Rate 06/06/19 0906 95     Resp 06/06/19 0906 18     Temp 06/06/19 0906 98.2 F (36.8 C)     Temp Source 06/06/19 0906 Oral     SpO2 06/06/19 0906 96 %     Weight 06/06/19 0902 235 lb (106.6 kg)     Height 06/06/19 0902 5\' 11"  (1.803 m)     Head Circumference --      Peak Flow --      Pain Score 06/06/19 0902 0     Pain Loc --      Pain Edu? --      Excl. in GC? --    Updated Vital Signs BP (!) 148/97 (BP Location: Left Arm)   Pulse 95   Temp 98.2 F (36.8 C) (Oral)   Resp 18   Ht 5\' 11"  (1.803 m)   Wt 106.6 kg   SpO2 96%   BMI 32.78 kg/m   Visual Acuity Right Eye Distance:   Left Eye Distance:   Bilateral Distance:    Right Eye Near:   Left Eye Near:    Bilateral Near:     Physical Exam Vitals signs and nursing note reviewed.  Constitutional:      General: He is not in acute distress.    Appearance: Normal appearance. He is not ill-appearing.  HENT:     Head: Normocephalic and atraumatic.     Nose: Nose normal.  Eyes:     General: No scleral icterus.       Right eye: No discharge.        Left eye: No discharge.     Conjunctiva/sclera: Conjunctivae normal.  Cardiovascular:     Rate and Rhythm: Regular rhythm. Tachycardia present.     Heart sounds: No murmur.  Pulmonary:     Effort:  Pulmonary effort is normal.     Breath sounds: Normal breath sounds. No wheezing, rhonchi or rales.  Abdominal:     General: There is no distension.     Palpations: Abdomen is soft. There is no mass.     Tenderness: There is no abdominal tenderness.  Neurological:  Mental Status: He is alert.  Psychiatric:        Mood and Affect: Mood normal.        Behavior: Behavior normal.    UC Treatments / Results  Labs (all labs ordered are listed, but only abnormal results are displayed) Labs Reviewed  COMPREHENSIVE METABOLIC PANEL - Abnormal; Notable for the following components:      Result Value   Glucose, Bld 181 (*)    ALT 47 (*)    All other components within normal limits  CBC WITH DIFFERENTIAL/PLATELET  LIPASE, BLOOD    EKG   Radiology No results found.  Procedures Procedures (including critical care time)  Medications Ordered in UC Medications - No data to display  Initial Impression / Assessment and Plan / UC Course  I have reviewed the triage vital signs and the nursing notes.  Pertinent labs & imaging results that were available during my care of the patient were reviewed by me and considered in my medical decision making (see chart for details).    48 year old male presents with nausea and vomiting. Labs notable for hyperglycemia. Mildly elevated ALT. Tolerating fluids. Discharging home on Zofran.  Work note given.  Final Clinical Impressions(s) / UC Diagnoses   Final diagnoses:  Non-intractable vomiting with nausea, unspecified vomiting type     Discharge Instructions     Rest. Fluids.  Zofran if needed.  Take care  Dr. Lacinda Axon    ED Prescriptions    Medication Sig Dispense Auth. Provider   ondansetron (ZOFRAN-ODT) 4 MG disintegrating tablet Take 1 tablet (4 mg total) by mouth every 8 (eight) hours as needed for nausea or vomiting. 20 tablet Coral Spikes, DO     Controlled Substance Prescriptions [place] Controlled Substance Registry consulted?  Not Applicable   Coral Spikes, DO 06/06/19 1228

## 2019-06-06 NOTE — Discharge Instructions (Signed)
Rest. Fluids.  Zofran if needed.  Take care  Dr. Lacinda Axon

## 2019-06-06 NOTE — ED Triage Notes (Signed)
Pt c/o vomiting. Started about 2 days ago. He states that he ate this morning and was able to hold food down. He states that he normally drinks about 6-8 alcoholic beverages a day. He thinks the vomiting may be from this. Denies abdominal pain. He needs a note for work.

## 2021-03-15 IMAGING — DX PORTABLE CHEST - 1 VIEW
1 series · 1 of 1 positions shown · non-contrast
Comparison: 02/12/2018

CLINICAL DATA: Cough.  Flu like symptoms

EXAM:
PORTABLE CHEST 1 VIEW

[chest ap]
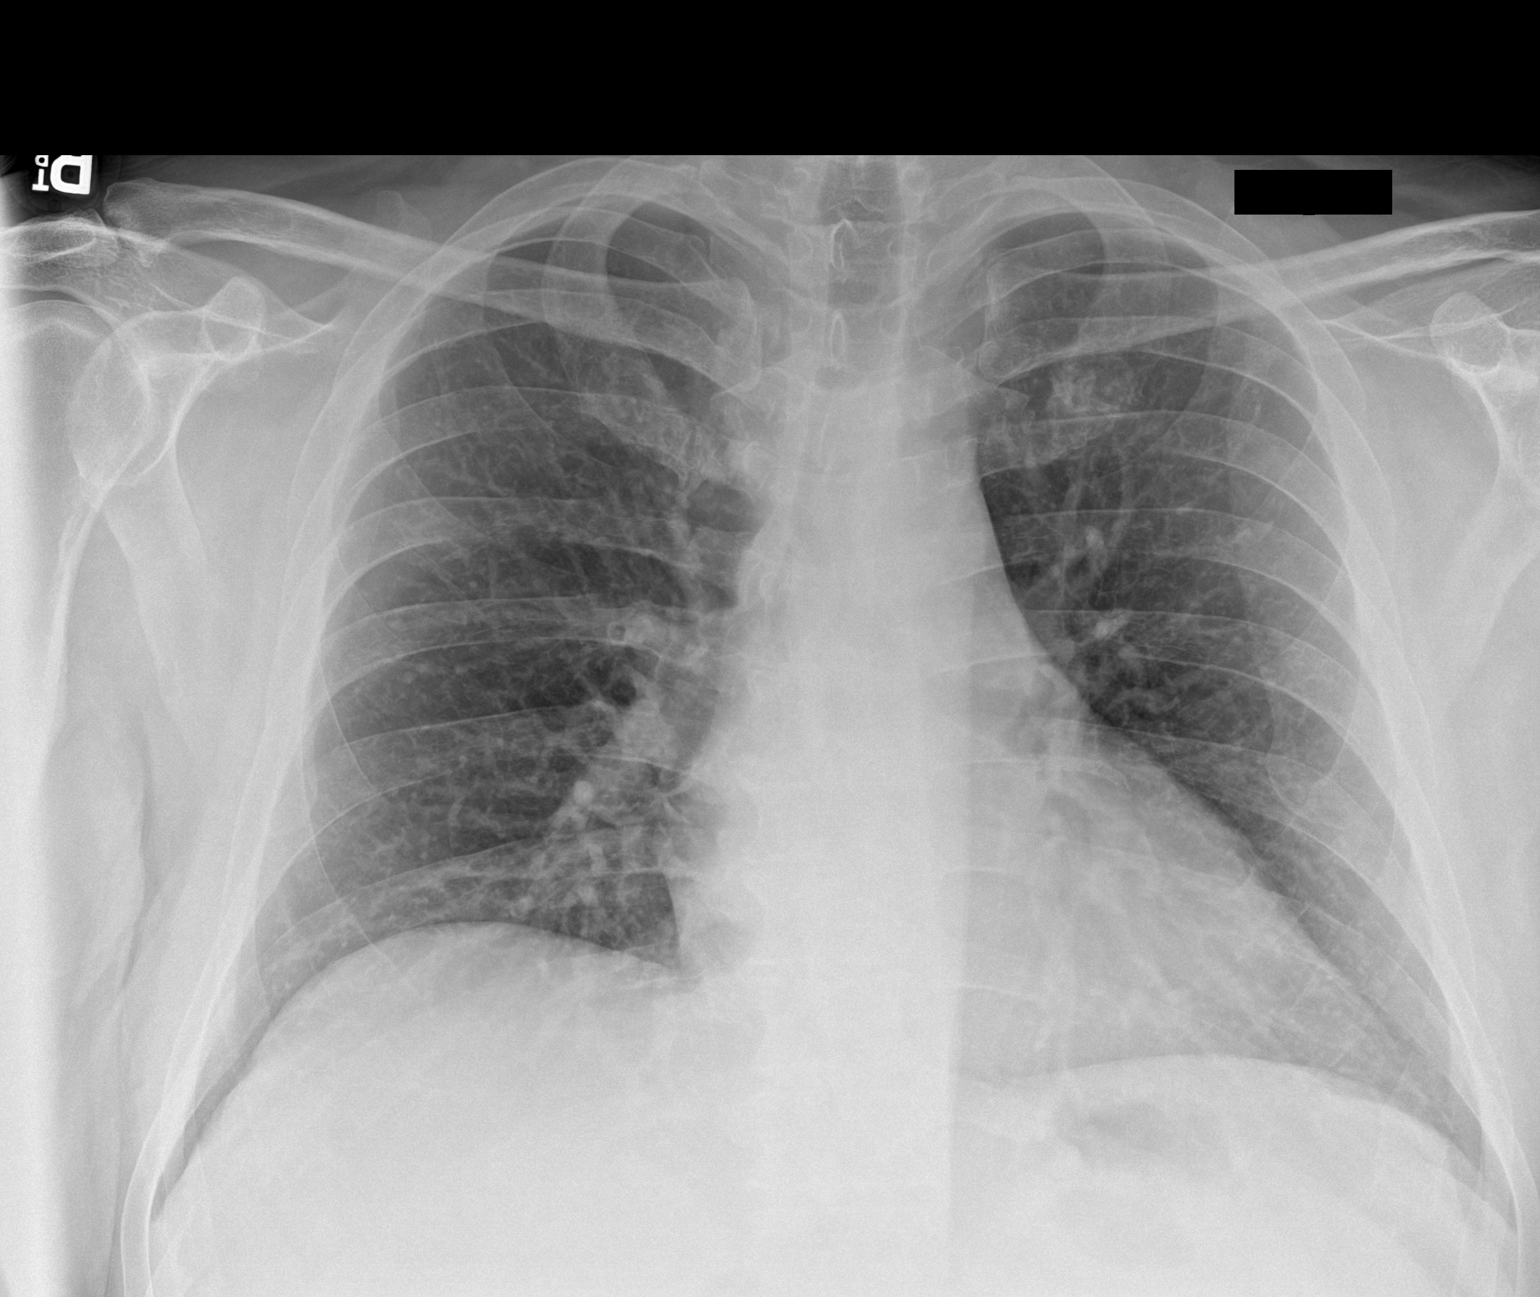

[1 of 1 positions shown; findings below may reference images not displayed]

FINDINGS: Normal heart size and mediastinal contours. No acute infiltrate or
edema. No effusion or pneumothorax. No acute osseous findings.
IMPRESSION: Negative chest.

## 2024-03-01 ENCOUNTER — Other Ambulatory Visit: Payer: Self-pay

## 2024-03-01 ENCOUNTER — Emergency Department: Payer: MEDICAID

## 2024-03-01 ENCOUNTER — Encounter: Payer: Self-pay | Admitting: Emergency Medicine

## 2024-03-01 ENCOUNTER — Inpatient Hospital Stay
Admission: EM | Admit: 2024-03-01 | Discharge: 2024-03-03 | DRG: 638 | Discharge status: Against Medical Advice | Payer: Self-pay | Attending: Family Medicine | Admitting: Family Medicine

## 2024-03-01 DIAGNOSIS — Z955 Presence of coronary angioplasty implant and graft: Secondary | ICD-10-CM

## 2024-03-01 DIAGNOSIS — Z5329 Procedure and treatment not carried out because of patient's decision for other reasons: Secondary | ICD-10-CM | POA: Diagnosis not present

## 2024-03-01 DIAGNOSIS — Z794 Long term (current) use of insulin: Secondary | ICD-10-CM

## 2024-03-01 DIAGNOSIS — Z7902 Long term (current) use of antithrombotics/antiplatelets: Secondary | ICD-10-CM

## 2024-03-01 DIAGNOSIS — R072 Precordial pain: Secondary | ICD-10-CM | POA: Diagnosis not present

## 2024-03-01 DIAGNOSIS — Z951 Presence of aortocoronary bypass graft: Secondary | ICD-10-CM

## 2024-03-01 DIAGNOSIS — I10 Essential (primary) hypertension: Secondary | ICD-10-CM | POA: Diagnosis present

## 2024-03-01 DIAGNOSIS — E1110 Type 2 diabetes mellitus with ketoacidosis without coma: Principal | ICD-10-CM | POA: Insufficient documentation

## 2024-03-01 DIAGNOSIS — Z79899 Other long term (current) drug therapy: Secondary | ICD-10-CM

## 2024-03-01 DIAGNOSIS — E1140 Type 2 diabetes mellitus with diabetic neuropathy, unspecified: Secondary | ICD-10-CM | POA: Diagnosis present

## 2024-03-01 DIAGNOSIS — Z5971 Insufficient health insurance coverage: Secondary | ICD-10-CM | POA: Diagnosis not present

## 2024-03-01 DIAGNOSIS — I2510 Atherosclerotic heart disease of native coronary artery without angina pectoris: Secondary | ICD-10-CM | POA: Diagnosis present

## 2024-03-01 DIAGNOSIS — Z7982 Long term (current) use of aspirin: Secondary | ICD-10-CM | POA: Diagnosis not present

## 2024-03-01 DIAGNOSIS — E785 Hyperlipidemia, unspecified: Secondary | ICD-10-CM | POA: Diagnosis present

## 2024-03-01 DIAGNOSIS — R000 Tachycardia, unspecified: Secondary | ICD-10-CM | POA: Diagnosis present

## 2024-03-01 DIAGNOSIS — R112 Nausea with vomiting, unspecified: Secondary | ICD-10-CM | POA: Diagnosis present

## 2024-03-01 DIAGNOSIS — F329 Major depressive disorder, single episode, unspecified: Secondary | ICD-10-CM | POA: Diagnosis present

## 2024-03-01 DIAGNOSIS — Z888 Allergy status to other drugs, medicaments and biological substances status: Secondary | ICD-10-CM

## 2024-03-01 DIAGNOSIS — Z882 Allergy status to sulfonamides status: Secondary | ICD-10-CM

## 2024-03-01 DIAGNOSIS — Z91148 Patient's other noncompliance with medication regimen for other reason: Secondary | ICD-10-CM | POA: Diagnosis not present

## 2024-03-01 DIAGNOSIS — Z8639 Personal history of other endocrine, nutritional and metabolic disease: Secondary | ICD-10-CM | POA: Diagnosis present

## 2024-03-01 DIAGNOSIS — F419 Anxiety disorder, unspecified: Secondary | ICD-10-CM | POA: Diagnosis present

## 2024-03-01 DIAGNOSIS — R451 Restlessness and agitation: Secondary | ICD-10-CM | POA: Diagnosis not present

## 2024-03-01 DIAGNOSIS — T383X6A Underdosing of insulin and oral hypoglycemic [antidiabetic] drugs, initial encounter: Secondary | ICD-10-CM | POA: Diagnosis present

## 2024-03-01 DIAGNOSIS — Z560 Unemployment, unspecified: Secondary | ICD-10-CM

## 2024-03-01 DIAGNOSIS — Z833 Family history of diabetes mellitus: Secondary | ICD-10-CM | POA: Diagnosis not present

## 2024-03-01 DIAGNOSIS — R45851 Suicidal ideations: Secondary | ICD-10-CM

## 2024-03-01 HISTORY — DX: Type 2 diabetes mellitus with ketoacidosis without coma: E11.10

## 2024-03-01 LAB — BASIC METABOLIC PANEL WITH GFR
Anion gap: 20 — ABNORMAL HIGH (ref 5–15)
Anion gap: 21 — ABNORMAL HIGH (ref 5–15)
Anion gap: 14 (ref 5–15)
BUN: 29 mg/dL — ABNORMAL HIGH (ref 6–20)
BUN: 29 mg/dL — ABNORMAL HIGH (ref 6–20)
BUN: 26 mg/dL — ABNORMAL HIGH (ref 6–20)
CO2: 11 mmol/L — ABNORMAL LOW (ref 22–32)
CO2: 9 mmol/L — ABNORMAL LOW (ref 22–32)
CO2: 13 mmol/L — ABNORMAL LOW (ref 22–32)
Calcium: 9 mg/dL (ref 8.9–10.3)
Calcium: 8.8 mg/dL — ABNORMAL LOW (ref 8.9–10.3)
Calcium: 9 mg/dL (ref 8.9–10.3)
Chloride: 102 mmol/L (ref 98–111)
Chloride: 104 mmol/L (ref 98–111)
Chloride: 110 mmol/L (ref 98–111)
Creatinine, Ser: 1.56 mg/dL — ABNORMAL HIGH (ref 0.61–1.24)
Creatinine, Ser: 1.56 mg/dL — ABNORMAL HIGH (ref 0.61–1.24)
Creatinine, Ser: 1.19 mg/dL (ref 0.61–1.24)
GFR, Estimated: 53 mL/min — ABNORMAL LOW (ref >60)
GFR, Estimated: 53 mL/min — ABNORMAL LOW (ref >60)
GFR, Estimated: >60 mL/min (ref >60)
Glucose, Bld: 442 mg/dL — ABNORMAL HIGH (ref 70–99)
Glucose, Bld: 440 mg/dL — ABNORMAL HIGH (ref 70–99)
Glucose, Bld: 254 mg/dL — ABNORMAL HIGH (ref 70–99)
Potassium: 4.9 mmol/L (ref 3.5–5.1)
Potassium: 5.1 mmol/L (ref 3.5–5.1)
Potassium: 4.4 mmol/L (ref 3.5–5.1)
Sodium: 133 mmol/L — ABNORMAL LOW (ref 135–145)
Sodium: 134 mmol/L — ABNORMAL LOW (ref 135–145)
Sodium: 137 mmol/L (ref 135–145)

## 2024-03-01 LAB — CBC
HCT: 51.1 % (ref 39.0–52.0)
Hemoglobin: 17.1 g/dL — ABNORMAL HIGH (ref 13.0–17.0)
MCH: 30.5 pg (ref 26.0–34.0)
MCHC: 33.5 g/dL (ref 30.0–36.0)
MCV: 91.1 fL (ref 80.0–100.0)
Platelets: 208 10*3/uL (ref 150–400)
RBC: 5.61 MIL/uL (ref 4.22–5.81)
RDW: 12.7 % (ref 11.5–15.5)
WBC: 6.9 10*3/uL (ref 4.0–10.5)
nRBC: 0 % (ref 0.0–0.2)

## 2024-03-01 LAB — TROPONIN I (HIGH SENSITIVITY)
Troponin I (High Sensitivity): 11 ng/L (ref <18)
Troponin I (High Sensitivity): 11 ng/L (ref <18)

## 2024-03-01 LAB — LIPASE, BLOOD: Lipase: 45 U/L (ref 11–51)

## 2024-03-01 LAB — BLOOD GAS, VENOUS
Acid-base deficit: 18.1 mmol/L — ABNORMAL HIGH (ref 0.0–2.0)
Bicarbonate: 9.5 mmol/L — ABNORMAL LOW (ref 20.0–28.0)
O2 Saturation: 73.3 %
Patient temperature: 37
pCO2, Ven: 28 mmHg — ABNORMAL LOW (ref 44–60)
pH, Ven: 7.14 — CL (ref 7.25–7.43)
pO2, Ven: 45 mmHg (ref 32–45)

## 2024-03-01 LAB — GLUCOSE, CAPILLARY
Glucose-Capillary: 308 mg/dL — ABNORMAL HIGH (ref 70–99)
Glucose-Capillary: 244 mg/dL — ABNORMAL HIGH (ref 70–99)
Glucose-Capillary: 235 mg/dL — ABNORMAL HIGH (ref 70–99)
Glucose-Capillary: 242 mg/dL — ABNORMAL HIGH (ref 70–99)
Glucose-Capillary: 236 mg/dL — ABNORMAL HIGH (ref 70–99)

## 2024-03-01 LAB — URINALYSIS, ROUTINE W REFLEX MICROSCOPIC
Bacteria, UA: NONE SEEN
Bilirubin Urine: NEGATIVE
Glucose, UA: >=500 mg/dL — AB
Ketones, ur: 80 mg/dL — AB
Leukocytes,Ua: NEGATIVE
Nitrite: NEGATIVE
Protein, ur: 100 mg/dL — AB
Specific Gravity, Urine: 1.022 (ref 1.005–1.030)
Squamous Epithelial / HPF: 0 /HPF (ref 0–5)
pH: 5 (ref 5.0–8.0)

## 2024-03-01 LAB — HEPATIC FUNCTION PANEL
ALT: 29 U/L (ref 0–44)
AST: 17 U/L (ref 15–41)
Albumin: 4.1 g/dL (ref 3.5–5.0)
Alkaline Phosphatase: 78 U/L (ref 38–126)
Bilirubin, Direct: <0.1 mg/dL (ref 0.0–0.2)
Total Bilirubin: 1.8 mg/dL — ABNORMAL HIGH (ref 0.0–1.2)
Total Protein: 7.2 g/dL (ref 6.5–8.1)

## 2024-03-01 LAB — CBG MONITORING, ED
Glucose-Capillary: 442 mg/dL — ABNORMAL HIGH (ref 70–99)
Glucose-Capillary: 454 mg/dL — ABNORMAL HIGH (ref 70–99)
Glucose-Capillary: 440 mg/dL — ABNORMAL HIGH (ref 70–99)
Glucose-Capillary: 377 mg/dL — ABNORMAL HIGH (ref 70–99)

## 2024-03-01 LAB — ETHANOL: Alcohol, Ethyl (B): <15 mg/dL (ref <15)

## 2024-03-01 LAB — HEMOGLOBIN A1C
Hgb A1c MFr Bld: 8.8 % — ABNORMAL HIGH (ref 4.8–5.6)
Mean Plasma Glucose: 205.86 mg/dL

## 2024-03-01 LAB — BETA-HYDROXYBUTYRIC ACID
Beta-Hydroxybutyric Acid: >8 mmol/L — ABNORMAL HIGH (ref 0.05–0.27)
Beta-Hydroxybutyric Acid: >8 mmol/L — ABNORMAL HIGH (ref 0.05–0.27)
Beta-Hydroxybutyric Acid: 4.73 mmol/L — ABNORMAL HIGH (ref 0.05–0.27)

## 2024-03-01 LAB — MRSA NEXT GEN BY PCR, NASAL: MRSA by PCR Next Gen: NOT DETECTED

## 2024-03-01 MED ORDER — FUROSEMIDE 20 MG PO TABS
20.0000 mg | ORAL_TABLET | Freq: Two times a day (BID) | ORAL | Status: DC
  Administered 2024-03-01 – 2024-03-03 (×4): 20 mg via ORAL
  Filled 2024-03-01 (×4): qty 1

## 2024-03-01 MED ORDER — ATORVASTATIN CALCIUM 80 MG PO TABS
80.0000 mg | ORAL_TABLET | Freq: Every day | ORAL | Status: DC
  Administered 2024-03-01 – 2024-03-03 (×3): 80 mg via ORAL
  Filled 2024-03-01: qty 4
  Filled 2024-03-01 (×2): qty 1

## 2024-03-01 MED ORDER — ONDANSETRON HCL 4 MG/2ML IJ SOLN
4.0000 mg | Freq: Once | INTRAMUSCULAR | Status: AC
  Administered 2024-03-01: 4 mg via INTRAVENOUS
  Filled 2024-03-01: qty 2

## 2024-03-01 MED ORDER — CALCIUM CARBONATE ANTACID 500 MG PO CHEW
500.0000 mg | CHEWABLE_TABLET | Freq: Once | ORAL | Status: AC
  Administered 2024-03-01: 200 mg via ORAL
  Filled 2024-03-01: qty 1

## 2024-03-01 MED ORDER — SALINE SPRAY 0.65 % NA SOLN: 2.0000 | NASAL | Status: DC | PRN

## 2024-03-01 MED ORDER — CARVEDILOL 12.5 MG PO TABS
12.5000 mg | ORAL_TABLET | Freq: Two times a day (BID) | ORAL | Status: DC
  Administered 2024-03-02 – 2024-03-03 (×3): 12.5 mg via ORAL
  Filled 2024-03-01 (×4): qty 1

## 2024-03-01 MED ORDER — DEXTROSE IN LACTATED RINGERS 5 % IV SOLN: INTRAVENOUS | Status: AC

## 2024-03-01 MED ORDER — LACTATED RINGERS IV BOLUS
1000.0000 mL | Freq: Once | INTRAVENOUS | Status: AC
  Administered 2024-03-01: 1000 mL via INTRAVENOUS

## 2024-03-01 MED ORDER — FLUTICASONE PROPIONATE 50 MCG/ACT NA SUSP: 1.0000 | Freq: Two times a day (BID) | NASAL | Status: DC | PRN

## 2024-03-01 MED ORDER — ASPIRIN 81 MG PO TBEC
81.0000 mg | DELAYED_RELEASE_TABLET | Freq: Every day | ORAL | Status: DC
  Administered 2024-03-01 – 2024-03-03 (×3): 81 mg via ORAL
  Filled 2024-03-01 (×3): qty 1

## 2024-03-01 MED ORDER — LACTATED RINGERS IV SOLN: INTRAVENOUS | Status: AC

## 2024-03-01 MED ORDER — DEXTROSE 50 % IV SOLN: 0.0000 mL | INTRAVENOUS | Status: DC | PRN

## 2024-03-01 MED ORDER — ALUM & MAG HYDROXIDE-SIMETH 200-200-20 MG/5ML PO SUSP
30.0000 mL | ORAL | Status: DC | PRN
  Administered 2024-03-01 – 2024-03-02 (×2): 30 mL via ORAL
  Filled 2024-03-01 (×2): qty 30

## 2024-03-01 MED ORDER — CHLORHEXIDINE GLUCONATE CLOTH 2 % EX PADS
6.0000 | MEDICATED_PAD | Freq: Every day | CUTANEOUS | Status: DC
  Administered 2024-03-01 – 2024-03-03 (×3): 6 via TOPICAL

## 2024-03-01 MED ORDER — ONDANSETRON HCL 4 MG/2ML IJ SOLN
4.0000 mg | Freq: Four times a day (QID) | INTRAMUSCULAR | Status: DC | PRN
  Administered 2024-03-01 – 2024-03-03 (×2): 4 mg via INTRAVENOUS
  Filled 2024-03-01 (×2): qty 2

## 2024-03-01 MED ORDER — SODIUM CHLORIDE 0.9 % IV SOLN
12.5000 mg | Freq: Four times a day (QID) | INTRAVENOUS | Status: DC | PRN
  Administered 2024-03-01 – 2024-03-03 (×2): 12.5 mg via INTRAVENOUS
  Filled 2024-03-01 (×2): qty 12.5

## 2024-03-01 MED ORDER — CLOPIDOGREL BISULFATE 75 MG PO TABS
75.0000 mg | ORAL_TABLET | Freq: Every day | ORAL | Status: DC
  Administered 2024-03-01 – 2024-03-03 (×3): 75 mg via ORAL
  Filled 2024-03-01 (×3): qty 1

## 2024-03-01 MED ORDER — SODIUM CHLORIDE 0.9 % IV BOLUS
1000.0000 mL | Freq: Once | INTRAVENOUS | Status: AC
  Administered 2024-03-01: 1000 mL via INTRAVENOUS

## 2024-03-01 MED ORDER — INSULIN REGULAR(HUMAN) IN NACL 100-0.9 UT/100ML-% IV SOLN
INTRAVENOUS | Status: DC
  Administered 2024-03-01: 13 [IU]/h via INTRAVENOUS
  Administered 2024-03-02: 6.5 [IU]/h via INTRAVENOUS
  Filled 2024-03-01 (×2): qty 100

## 2024-03-01 MED ORDER — POTASSIUM CHLORIDE 10 MEQ/100ML IV SOLN
10.0000 meq | INTRAVENOUS | Status: AC
  Administered 2024-03-01 (×2): 10 meq via INTRAVENOUS
  Filled 2024-03-01 (×2): qty 100

## 2024-03-01 NOTE — ED Provider Notes (Signed)
 [facility] Provider Note    Event Date/Time   First MD Initiated Contact with Patient 03/01/24 1533     (approximate)   History   Chief Complaint Hyperglycemia and Suicidal   HPI  Franklin Douglas is a 53 y.o. male with past medical history of hyperlipidemia, alcohol abuse, and diabetes who presents to the ED complaining of hyperglycemia.  Patient reports that his blood sugars have been running high for the past week since he has not been able to afford his insulin .  Over the past 24 hours, he reports increasing generalized weakness with nausea and vomiting.  He has been unable to keep anything down during this time, denies any abdominal pain, diarrhea, or dysuria.  He has not had any fevers, does endorse some soreness in his chest which he feels like is due to acid from vomiting.  He denies any cough or shortness of breath.  He also reports some thoughts of suicide recently, denies any specific plan.     Physical Exam   Triage Vital Signs: ED Triage Vitals  Encounter Vitals Group     BP 03/01/24 1435 136/83     Systolic BP Percentile --      Diastolic BP Percentile --      Pulse Rate 03/01/24 1435 (!) 125     Resp 03/01/24 1435 17     Temp 03/01/24 1435 98 F (36.7 C)     Temp Source 03/01/24 1435 Oral     SpO2 03/01/24 1435 100 %     Weight 03/01/24 1436 235 lb (106.6 kg)     Height 03/01/24 1436 5' 11.5" (1.816 m)     Head Circumference --      Peak Flow --      Pain Score 03/01/24 1435 3     Pain Loc --      Pain Education --      Exclude from Growth Chart --     Most recent vital signs: Vitals:   03/01/24 1435  BP: 136/83  Pulse: (!) 125  Resp: 17  Temp: 98 F (36.7 C)  SpO2: 100%    Constitutional: Alert and oriented. Eyes: Conjunctivae are normal. Head: Atraumatic. Nose: No congestion/rhinnorhea. Mouth/Throat: Mucous membranes are dry. Cardiovascular: Tachycardic, regular rhythm. Grossly normal heart sounds.  2+ radial  pulses bilaterally. Respiratory: Normal respiratory effort.  No retractions. Lungs CTAB. Gastrointestinal: Soft and nontender. No distention. Musculoskeletal: No lower extremity tenderness nor edema.  Neurologic:  Normal speech and language. No gross focal neurologic deficits are appreciated.    ED Results / Procedures / Treatments   Labs (all labs ordered are listed, but only abnormal results are displayed) Labs Reviewed  BASIC METABOLIC PANEL WITH GFR - Abnormal; Notable for the following components:      Result Value   Sodium 133 (*)    CO2 11 (*)    Glucose, Bld 442 (*)    BUN 29 (*)    Creatinine, Ser 1.56 (*)    GFR, Estimated 53 (*)    Anion gap 20 (*)    All other components within normal limits  CBC - Abnormal; Notable for the following components:   Hemoglobin 17.1 (*)    All other components within normal limits  BLOOD GAS, VENOUS - Abnormal; Notable for the following components:   pH, Ven 7.14 (*)    pCO2, Ven 28 (*)    Bicarbonate 9.5 (*)    Acid-base deficit 18.1 (*)  All other components within normal limits  CBG MONITORING, ED - Abnormal; Notable for the following components:   Glucose-Capillary 442 (*)    All other components within normal limits  BETA-HYDROXYBUTYRIC ACID  URINALYSIS, ROUTINE W REFLEX MICROSCOPIC  ETHANOL  HEPATIC FUNCTION PANEL  LIPASE, BLOOD  CBG MONITORING, ED  TROPONIN I (HIGH SENSITIVITY)  TROPONIN I (HIGH SENSITIVITY)     EKG  ED ECG REPORT I, Twilla Galea, the attending physician, personally viewed and interpreted this ECG.   Date: 03/01/2024  EKG Time: 14:41  Rate: 126  Rhythm: sinus tachycardia  Axis: Normal  Intervals:none  ST&T Change: Inferolateral T wave inversions  RADIOLOGY Chest x-ray reviewed and interpreted by me with no infiltrate, edema, or effusion.  PROCEDURES:  Critical Care performed: Yes, see critical care procedure note(s)  .Critical Care  Performed by: Twilla Galea, MD Authorized  by: Twilla Galea, MD   Critical care provider statement:    Critical care time (minutes):  30   Critical care was necessary to treat or prevent imminent or life-threatening deterioration of the following conditions:  Endocrine crisis and metabolic crisis   Critical care was time spent personally by me on the following activities:  Development of treatment plan with patient or surrogate, discussions with consultants, evaluation of patient's response to treatment, examination of patient, ordering and review of laboratory studies, ordering and review of radiographic studies, ordering and performing treatments and interventions, pulse oximetry, re-evaluation of patient's condition and review of old charts   I assumed direction of critical care for this patient from another provider in my specialty: no     Care discussed with: admitting provider      MEDICATIONS ORDERED IN ED: Medications  insulin  regular, human (MYXREDLIN ) 100 units/ 100 mL infusion (has no administration in time range)  lactated ringers  infusion (has no administration in time range)  dextrose  5 % in lactated ringers  infusion (has no administration in time range)  dextrose  50 % solution 0-50 mL (has no administration in time range)  potassium chloride  10 mEq in 100 mL IVPB (has no administration in time range)  ondansetron  (ZOFRAN ) injection 4 mg (has no administration in time range)  lactated ringers  bolus 1,000 mL (1,000 mLs Intravenous New Bag/Given 03/01/24 1556)  lactated ringers  bolus 1,000 mL (1,000 mLs Intravenous New Bag/Given 03/01/24 1555)     IMPRESSION / MDM / ASSESSMENT AND PLAN / ED COURSE  I reviewed the triage vital signs and the nursing notes.                              53 y.o. male with past medical history of hyperlipidemia, alcohol abuse, and diabetes who presents to the ED complaining of nausea, vomiting, and generalized weakness for the past 24 hours after his blood sugar has been running high for  the past week.  Patient's presentation is most consistent with acute presentation with potential threat to life or bodily function.  Differential diagnosis includes, but is not limited to, DKA, HHS, hyperglycemia, dehydration, electrolyte abnormality, AKI, pancreatitis, hepatitis, gastritis, cholecystitis.  Patient ill-appearing but in no acute distress, vital signs remarkable for tachycardia but otherwise reassuring.  He appears dehydrated and labs are concerning for DKA as well as mild AKI.  We will hydrate with 2 L of IV fluids, initiate insulin  drip with supplemental potassium.  Labs without significant anemia or leukocytosis, suspect DKA due to medication noncompliance rather than sepsis or infection.  Chest  x-ray unremarkable by my read, troponin within normal limits and suspect chest discomfort due to esophagitis from vomiting.  Case discussed with hospitalist for admission.  He would benefit from psychiatric evaluation during admission, however denies any intent to act on his thoughts of suicide and we will hold off on IVC at this time.      FINAL CLINICAL IMPRESSION(S) / ED DIAGNOSES   Final diagnoses:  Diabetic ketoacidosis without coma associated with type 2 diabetes mellitus (HCC)  Suicidal ideation     Rx / DC Orders   ED Discharge Orders     None        Note:  This document was prepared using Dragon voice recognition software and may include unintentional dictation errors.   Twilla Galea, MD 03/01/24 [phone]

## 2024-03-01 NOTE — Progress Notes (Signed)
 Patient refusing to answer suicidal questions/assessment.  He states that he is afraid to answer because he "may get locked up and not able to take care of his girlfriend."

## 2024-03-01 NOTE — H&P (Signed)
 History and Physical    Franklin Douglas WGN:[id number] DOB: 1971/02/18 DOA: 03/01/2024  PCP: [facility] (Confirm with patient/family/NH records and if not entered, this has to be entered at [facility] point of entry) Patient coming from: Home  I have personally briefly reviewed patient's old medical records in [facility]  Chief Complaint: I have ran out of insulin   HPI: Franklin Douglas is a 53 y.o. male with medical history significant of IIDM, diabetic neuropathy, HTN, CAD status post PCI and stenting 2017, HLD, presented to ED with DKA.  Due to insurance problems, patient ran out of insulin  10 days ago.  Gradually he started to develop feeling of nausea and started to vomit yesterday evening, he denied any abdominal pain no diarrhea no fever or chills.  ED Course: Tachycardia, blood pressure 138/80, blood work showed glucose 491, BUN 29 creatinine 1.5 compared to baseline creatinine less than 1.0, bicarb 11K 4.9.  Patient was given IV bolus x 2 L and started on insulin  drip.  Review of Systems: As per HPI otherwise 14 point review of systems negative.    Past Medical History:  Diagnosis Date   Diabetes mellitus without complication (HCC)    Diabetic neuropathy (HCC)    Diverticula, colon    Diverticulitis    Sciatica     Past Surgical History:  Procedure Laterality Date   CARDIAC SURGERY     COLON SURGERY     HERNIA REPAIR       reports that he has never smoked. He has never used smokeless tobacco. He reports current alcohol use of about 20.0 standard drinks of alcohol per week. He reports that he does not use drugs.  Allergies  Allergen Reactions   Sulfa Antibiotics Hives, Shortness Of Breath, Rash and Anaphylaxis   Ace Inhibitors     Cough    Family History  Problem Relation Age of Onset   Diabetes Mother    Diabetes Brother      Prior to Admission medications   Medication Sig Start Date End Date Taking? Authorizing Provider  Insulin  Regular Human  (NOVOLIN R RELION IJ) Inject as directed. Sliding scale   Yes [provider]  aspirin  EC 81 MG tablet Take 81 mg by mouth daily.  Patient not taking: Reported on 03/01/2024    [provider]  atorvastatin  (LIPITOR) 80 MG tablet Take 80 mg by mouth daily. Patient not taking: Reported on 03/01/2024 11/22/18   [provider]  carvedilol  (COREG ) 12.5 MG tablet Take 12.5 mg by mouth 2 (two) times daily. Patient not taking: Reported on 03/01/2024 11/16/18   [provider]  clopidogrel (PLAVIX) 75 MG tablet Take 75 mg by mouth daily. Patient not taking: Reported on 03/01/2024 01/12/18   [provider]  fluticasone  (FLONASE ) 50 MCG/ACT nasal spray Place 1 spray into both nostrils 2 (two) times daily. Patient not taking: Reported on 03/01/2024 03/18/18 06/06/19  Betancourt, Tina A, NP  furosemide (LASIX) 20 MG tablet Take 20 mg by mouth 2 (two) times daily. Patient not taking: Reported on 03/01/2024 11/13/18   [provider]  insulin  aspart (NOVOLOG ) 100 UNIT/ML injection Inject 3-6 Units into the skin See admin instructions. Sliding scale 130-160=1unit, 161-190=2units, 191-220=3units...Aaron AasAaron AasAaron Aasevery 30points for BS reading equal 1 unit Patient not taking: Reported on 03/01/2024 08/25/16   [provider]  insulin  detemir (LEVEMIR ) 100 UNIT/ML injection Inject 50 Units into the skin at bedtime.  Patient not taking: Reported on 03/01/2024    [provider]  ondansetron  (ZOFRAN -ODT) 4 MG disintegrating tablet Take 1 tablet (4 mg total) by mouth every 8 (eight) hours as needed for nausea or vomiting. Patient not taking: Reported on 03/01/2024 06/06/19   Cook, Jayce G, DO  sildenafil (VIAGRA) 100 MG tablet Take 50 mg by mouth as directed. Patient not taking: Reported on 03/01/2024 05/27/18   [provider]  sodium chloride  (OCEAN) 0.65 % SOLN nasal spray Place 2 sprays into both nostrils every 2 (two) hours while awake. Patient not taking:  Reported on 03/01/2024 03/18/18 01/02/19  Betancourt, Tina A, NP  albuterol  (PROVENTIL  HFA;VENTOLIN  HFA) 108 (90 Base) MCG/ACT inhaler Inhale 1-2 puffs into the lungs every 4 (four) hours as needed for wheezing or shortness of breath. Patient not taking: Reported on 01/12/2019 03/18/18 06/06/19  Betancourt, Tina A, NP  fexofenadine  (ALLEGRA  ALLERGY) 180 MG tablet Take 1 tablet (180 mg total) by mouth daily. 03/18/18 06/06/19  Betancourt, Cleotis Daily, NP  gabapentin  (NEURONTIN ) 300 MG capsule Take 300 mg by mouth 2 (two) times daily.  06/06/19  [provider]  losartan  (COZAAR ) 50 MG tablet Take 50 mg by mouth daily. 11/16/18 06/06/19  [provider]  traZODone  (DESYREL ) 50 MG tablet Take 50-150 mg by mouth at bedtime as needed for sleep. 09/13/18 06/06/19  [provider]    Physical Exam: Vitals:   03/01/24 1435 03/01/24 1436 03/01/24 1600 03/01/24 1630  BP: 136/83     Pulse: (!) 125  (!) 118 (!) 118  Resp: 17  (!) 22 20  Temp: 98 F (36.7 C)     TempSrc: Oral     SpO2: 100%  98% 99%  Weight:  106.6 kg    Height:  5' 11.5" (1.816 m)      Constitutional: NAD, calm, comfortable Vitals:   03/01/24 1435 03/01/24 1436 03/01/24 1600 03/01/24 1630  BP: 136/83     Pulse: (!) 125  (!) 118 (!) 118  Resp: 17  (!) 22 20  Temp: 98 F (36.7 C)     TempSrc: Oral     SpO2: 100%  98% 99%  Weight:  106.6 kg    Height:  5' 11.5" (1.816 m)     Eyes: PERRL, lids and conjunctivae normal ENMT: Mucous membranes are dry. Posterior pharynx clear of any exudate or lesions.Normal dentition.  Neck: normal, supple, no masses, no thyromegaly Respiratory: clear to auscultation bilaterally, no wheezing, no crackles. Normal respiratory effort. No accessory muscle use.  Cardiovascular: Regular rate and rhythm, no murmurs / rubs / gallops. No extremity edema. 2+ pedal pulses. No carotid bruits.  Abdomen: no tenderness, no masses palpated. No hepatosplenomegaly. Bowel sounds positive.  Musculoskeletal:  no clubbing / cyanosis. No joint deformity upper and lower extremities. Good ROM, no contractures. Normal muscle tone.  Skin: no rashes, lesions, ulcers. No induration Neurologic: CN 2-12 grossly intact. Sensation intact, DTR normal. Strength 5/5 in all 4.  Psychiatric: Normal judgment and insight. Alert and oriented x 3. Normal mood.     Labs on Admission: I have personally reviewed following labs and imaging studies  CBC: Recent Labs  Lab 03/01/24 1442  WBC 6.9  HGB 17.1*  HCT 51.1  MCV 91.1  PLT 208   Basic Metabolic Panel: Recent Labs  Lab 03/01/24 1442  NA 133*  K 4.9  CL 102  CO2 11*  GLUCOSE 442*  BUN 29*  CREATININE 1.56*  CALCIUM  9.0   GFR: Estimated Creatinine Clearance: 68.5 mL/min (A) (by C-G formula based on  SCr of 1.56 mg/dL (H)). Liver Function Tests: Recent Labs  Lab 03/01/24 1442  AST 17  ALT 29  ALKPHOS 78  BILITOT 1.8*  PROT 7.2  ALBUMIN 4.1   Recent Labs  Lab 03/01/24 1442  LIPASE 45   No results for input(s): "AMMONIA" in the last 168 hours. Coagulation Profile: No results for input(s): "INR", "PROTIME" in the last 168 hours. Cardiac Enzymes: No results for input(s): "CKTOTAL", "CKMB", "CKMBINDEX", "TROPONINI" in the last 168 hours. BNP (last 3 results) No results for input(s): "PROBNP" in the last 8760 hours. HbA1C: No results for input(s): "HGBA1C" in the last 72 hours. CBG: Recent Labs  Lab 03/01/24 1437 03/01/24 1619 03/01/24 1657  GLUCAP 442* 454* 440*   Lipid Profile: No results for input(s): "CHOL", "HDL", "LDLCALC", "TRIG", "CHOLHDL", "LDLDIRECT" in the last 72 hours. Thyroid Function Tests: No results for input(s): "TSH", "T4TOTAL", "FREET4", "T3FREE", "THYROIDAB" in the last 72 hours. Anemia Panel: No results for input(s): "VITAMINB12", "FOLATE", "FERRITIN", "TIBC", "IRON", "RETICCTPCT" in the last 72 hours. Urine analysis:    Component Value Date/Time   COLORURINE YELLOW (A) 02/13/2018 1222   APPEARANCEUR  CLEAR (A) 02/13/2018 1222   APPEARANCEUR Clear 08/21/2013 1631   LABSPEC 1.020 02/13/2018 1222   LABSPEC 1.033 08/21/2013 1631   PHURINE 6.0 02/13/2018 1222   GLUCOSEU 150 (A) 02/13/2018 1222   GLUCOSEU >=500 08/21/2013 1631   HGBUR MODERATE (A) 02/13/2018 1222   BILIRUBINUR NEGATIVE 02/13/2018 1222   BILIRUBINUR Negative 08/21/2013 1631   KETONESUR 20 (A) 02/13/2018 1222   PROTEINUR 100 (A) 02/13/2018 1222   NITRITE NEGATIVE 02/13/2018 1222   LEUKOCYTESUR NEGATIVE 02/13/2018 1222   LEUKOCYTESUR Negative 08/21/2013 1631    Radiological Exams on Admission: DG Chest Port 1 View Result Date: 03/01/2024 CLINICAL DATA:  Hyperglycemia.  Nausea vomiting since last night. EXAM: PORTABLE CHEST 1 VIEW COMPARISON:  01/12/2019. FINDINGS: Cardiac silhouette is normal in size and configuration. Stable left coronary artery stent. No mediastinal or hilar masses. Clear lungs.  No pleural effusion or pneumothorax. Skeletal structures are grossly intact. IMPRESSION: No active disease. Electronically Signed   By: Amanda Jungling M.D.   On: 03/01/2024 16:10    EKG: Independently reviewed.  Sinus tachycardia, no acute ST changes.  Assessment/Plan Principal Problem:   DKA (diabetic ketoacidosis) (HCC) Active Problems:   DKA, type 2 (HCC)  (please populate well all problems here in Problem List. (For example, if patient is on BP meds at home and you resume or decide to hold them, it is a problem that needs to be her. Same for CAD, COPD, HLD and so on)  DKA - Secondary to noncompliant with insulin  regimen due to insurance difficulty.  Patient reported that he himself does not have a job and has to stay at home to take care of his sick family member and recently had no insurance coverage.  Will consult case management - Will give 1 more liter IV bolus - Continue insulin  drip - BMP every 4 hours  Anxiety/depression - Patient denied any suicidal ideation to me.  Psychiatry was consulted earlier as patient  expressed feeling depressed when interviewed by ED physician.  CAD HTN - No chest pain, EKG showed no acute ST changes - Continue Plavix  and statin, and Coreg .  DVT prophylaxis: SCD Code Status: Full Family Communication: None at bedside Disposition Plan: Expect less than 2 midnight hospital stay Consults called: None Admission status: Stepdown unit   [place] MD [facility] Pager [phone]  03/01/2024, 5:19  PM

## 2024-03-01 NOTE — ED Notes (Signed)
 Patient belongings:  2 brown shoes 2 socks 1 blue t-shirt 1 grey shorts 1 wallet 1 set of keys

## 2024-03-01 NOTE — ED Triage Notes (Signed)
 Patient to ED via [facility] from home for hyperglycemia. Pt reports N/V since 11pm last night. Has not had insulin  in the past week due to cost. States he is also having chest pain. 20g L forearm- given NaCL with EMS  125 HR 148/96 97% RA 408 cbg  While asking screening questions patient states he is SI with plan to overdose on gabapentin .

## 2024-03-02 ENCOUNTER — Other Ambulatory Visit: Payer: Self-pay

## 2024-03-02 ENCOUNTER — Inpatient Hospital Stay
Admit: 2024-03-02 | Discharge: 2024-03-02 | Disposition: A | Discharge status: Home / Self Care | Payer: Self-pay | Attending: Family Medicine | Admitting: Family Medicine

## 2024-03-02 ENCOUNTER — Encounter: Payer: Self-pay | Admitting: Internal Medicine

## 2024-03-02 DIAGNOSIS — R45851 Suicidal ideations: Secondary | ICD-10-CM

## 2024-03-02 LAB — BASIC METABOLIC PANEL WITH GFR
Anion gap: 9 (ref 5–15)
Anion gap: 9 (ref 5–15)
Anion gap: 4 — ABNORMAL LOW (ref 5–15)
Anion gap: 5 (ref 5–15)
Anion gap: 6 (ref 5–15)
Anion gap: 6 (ref 5–15)
BUN: 24 mg/dL — ABNORMAL HIGH (ref 6–20)
BUN: 22 mg/dL — ABNORMAL HIGH (ref 6–20)
BUN: 20 mg/dL (ref 6–20)
BUN: 18 mg/dL (ref 6–20)
BUN: 18 mg/dL (ref 6–20)
BUN: 15 mg/dL (ref 6–20)
CO2: 18 mmol/L — ABNORMAL LOW (ref 22–32)
CO2: 17 mmol/L — ABNORMAL LOW (ref 22–32)
CO2: 24 mmol/L (ref 22–32)
CO2: 20 mmol/L — ABNORMAL LOW (ref 22–32)
CO2: 22 mmol/L (ref 22–32)
CO2: 19 mmol/L — ABNORMAL LOW (ref 22–32)
Calcium: 9 mg/dL (ref 8.9–10.3)
Calcium: 8.7 mg/dL — ABNORMAL LOW (ref 8.9–10.3)
Calcium: 8.7 mg/dL — ABNORMAL LOW (ref 8.9–10.3)
Calcium: 8.6 mg/dL — ABNORMAL LOW (ref 8.9–10.3)
Calcium: 8.7 mg/dL — ABNORMAL LOW (ref 8.9–10.3)
Calcium: 8.1 mg/dL — ABNORMAL LOW (ref 8.9–10.3)
Chloride: 110 mmol/L (ref 98–111)
Chloride: 110 mmol/L (ref 98–111)
Chloride: 110 mmol/L (ref 98–111)
Chloride: 111 mmol/L (ref 98–111)
Chloride: 111 mmol/L (ref 98–111)
Chloride: 108 mmol/L (ref 98–111)
Creatinine, Ser: 1.12 mg/dL (ref 0.61–1.24)
Creatinine, Ser: 0.97 mg/dL (ref 0.61–1.24)
Creatinine, Ser: 1.01 mg/dL (ref 0.61–1.24)
Creatinine, Ser: 0.93 mg/dL (ref 0.61–1.24)
Creatinine, Ser: 0.93 mg/dL (ref 0.61–1.24)
Creatinine, Ser: 0.94 mg/dL (ref 0.61–1.24)
GFR, Estimated: >60 mL/min (ref >60)
GFR, Estimated: >60 mL/min (ref >60)
GFR, Estimated: >60 mL/min (ref >60)
GFR, Estimated: >60 mL/min (ref >60)
GFR, Estimated: >60 mL/min (ref >60)
GFR, Estimated: >60 mL/min (ref >60)
Glucose, Bld: 222 mg/dL — ABNORMAL HIGH (ref 70–99)
Glucose, Bld: 221 mg/dL — ABNORMAL HIGH (ref 70–99)
Glucose, Bld: 206 mg/dL — ABNORMAL HIGH (ref 70–99)
Glucose, Bld: 184 mg/dL — ABNORMAL HIGH (ref 70–99)
Glucose, Bld: 194 mg/dL — ABNORMAL HIGH (ref 70–99)
Glucose, Bld: 268 mg/dL — ABNORMAL HIGH (ref 70–99)
Potassium: 4.1 mmol/L (ref 3.5–5.1)
Potassium: 3.7 mmol/L (ref 3.5–5.1)
Potassium: 3.8 mmol/L (ref 3.5–5.1)
Potassium: 3.3 mmol/L — ABNORMAL LOW (ref 3.5–5.1)
Potassium: 3.6 mmol/L (ref 3.5–5.1)
Potassium: 3.7 mmol/L (ref 3.5–5.1)
Sodium: 137 mmol/L (ref 135–145)
Sodium: 136 mmol/L (ref 135–145)
Sodium: 138 mmol/L (ref 135–145)
Sodium: 137 mmol/L (ref 135–145)
Sodium: 138 mmol/L (ref 135–145)
Sodium: 133 mmol/L — ABNORMAL LOW (ref 135–145)

## 2024-03-02 LAB — TROPONIN I (HIGH SENSITIVITY)
Troponin I (High Sensitivity): 23 ng/L — ABNORMAL HIGH (ref <18)
Troponin I (High Sensitivity): 27 ng/L — ABNORMAL HIGH (ref <18)

## 2024-03-02 LAB — GLUCOSE, CAPILLARY
Glucose-Capillary: 155 mg/dL — ABNORMAL HIGH (ref 70–99)
Glucose-Capillary: 164 mg/dL — ABNORMAL HIGH (ref 70–99)
Glucose-Capillary: 166 mg/dL — ABNORMAL HIGH (ref 70–99)
Glucose-Capillary: 166 mg/dL — ABNORMAL HIGH (ref 70–99)
Glucose-Capillary: 180 mg/dL — ABNORMAL HIGH (ref 70–99)
Glucose-Capillary: 183 mg/dL — ABNORMAL HIGH (ref 70–99)
Glucose-Capillary: 185 mg/dL — ABNORMAL HIGH (ref 70–99)
Glucose-Capillary: 190 mg/dL — ABNORMAL HIGH (ref 70–99)
Glucose-Capillary: 190 mg/dL — ABNORMAL HIGH (ref 70–99)
Glucose-Capillary: 190 mg/dL — ABNORMAL HIGH (ref 70–99)
Glucose-Capillary: 193 mg/dL — ABNORMAL HIGH (ref 70–99)
Glucose-Capillary: 193 mg/dL — ABNORMAL HIGH (ref 70–99)
Glucose-Capillary: 194 mg/dL — ABNORMAL HIGH (ref 70–99)
Glucose-Capillary: 200 mg/dL — ABNORMAL HIGH (ref 70–99)
Glucose-Capillary: 203 mg/dL — ABNORMAL HIGH (ref 70–99)
Glucose-Capillary: 218 mg/dL — ABNORMAL HIGH (ref 70–99)
Glucose-Capillary: 223 mg/dL — ABNORMAL HIGH (ref 70–99)
Glucose-Capillary: 239 mg/dL — ABNORMAL HIGH (ref 70–99)
Glucose-Capillary: 241 mg/dL — ABNORMAL HIGH (ref 70–99)
Glucose-Capillary: 254 mg/dL — ABNORMAL HIGH (ref 70–99)

## 2024-03-02 LAB — BETA-HYDROXYBUTYRIC ACID
Beta-Hydroxybutyric Acid: 1.32 mmol/L — ABNORMAL HIGH (ref 0.05–0.27)
Beta-Hydroxybutyric Acid: 0.87 mmol/L — ABNORMAL HIGH (ref 0.05–0.27)
Beta-Hydroxybutyric Acid: 0.32 mmol/L — ABNORMAL HIGH (ref 0.05–0.27)

## 2024-03-02 LAB — HIV ANTIBODY (ROUTINE TESTING W REFLEX): HIV Screen 4th Generation wRfx: NONREACTIVE

## 2024-03-02 MED ORDER — ORAL CARE MOUTH RINSE: 15.0000 mL | OROMUCOSAL | Status: DC | PRN

## 2024-03-02 MED ORDER — INSULIN ASPART 100 UNIT/ML IJ SOLN: 0.0000 [IU] | Freq: Three times a day (TID) | INTRAMUSCULAR | Status: DC

## 2024-03-02 MED ORDER — SERTRALINE HCL 50 MG PO TABS
25.0000 mg | ORAL_TABLET | Freq: Every day | ORAL | Status: DC
  Administered 2024-03-02: 25 mg via ORAL
  Filled 2024-03-02: qty 1

## 2024-03-02 MED ORDER — LACTATED RINGERS IV SOLN: INTRAVENOUS | Status: DC

## 2024-03-02 MED ORDER — INSULIN ASPART 100 UNIT/ML IJ SOLN
0.0000 [IU] | Freq: Three times a day (TID) | INTRAMUSCULAR | Status: DC
  Administered 2024-03-02: 5 [IU] via SUBCUTANEOUS
  Administered 2024-03-03: 8 [IU] via SUBCUTANEOUS
  Filled 2024-03-02 (×2): qty 1

## 2024-03-02 MED ORDER — HYDROCODONE-ACETAMINOPHEN 5-325 MG PO TABS
1.0000 | ORAL_TABLET | Freq: Four times a day (QID) | ORAL | Status: DC | PRN
  Administered 2024-03-02: 1 via ORAL
  Filled 2024-03-02: qty 1

## 2024-03-02 MED ORDER — HEPARIN SODIUM (PORCINE) 5000 UNIT/ML IJ SOLN
5000.0000 [IU] | Freq: Two times a day (BID) | INTRAMUSCULAR | Status: DC
  Administered 2024-03-02 – 2024-03-03 (×2): 5000 [IU] via SUBCUTANEOUS
  Filled 2024-03-02 (×2): qty 1

## 2024-03-02 MED ORDER — POTASSIUM CHLORIDE CRYS ER 20 MEQ PO TBCR
40.0000 meq | EXTENDED_RELEASE_TABLET | Freq: Once | ORAL | Status: AC
  Administered 2024-03-02: 40 meq via ORAL
  Filled 2024-03-02: qty 2

## 2024-03-02 MED ORDER — INSULIN GLARGINE-YFGN 100 UNIT/ML ~~LOC~~ SOLN
15.0000 [IU] | Freq: Every day | SUBCUTANEOUS | Status: DC
  Administered 2024-03-02: 15 [IU] via SUBCUTANEOUS
  Filled 2024-03-02: qty 0.15

## 2024-03-02 MED ORDER — INSULIN GLARGINE-YFGN 100 UNIT/ML ~~LOC~~ SOLN
15.0000 [IU] | Freq: Every day | SUBCUTANEOUS | Status: DC
  Filled 2024-03-02: qty 0.15

## 2024-03-02 MED ORDER — INSULIN STARTER KIT- PEN NEEDLES (ENGLISH)
1.0000 | Freq: Once | Status: AC
  Administered 2024-03-02: 1
  Filled 2024-03-02: qty 1

## 2024-03-02 NOTE — Progress Notes (Signed)
*  PRELIMINARY RESULTS* Echocardiogram 2D Echocardiogram has been performed.  Franklin Douglas 03/02/2024, 2:46 PM

## 2024-03-02 NOTE — Inpatient Diabetes Management (Addendum)
 Inpatient Diabetes Program Recommendations  AACE/ADA: New Consensus Statement on Inpatient Glycemic Control (2015)  Target Ranges:  Prepandial:   less than 140 mg/dL      Peak postprandial:   less than 180 mg/dL (1-2 hours)      Critically ill patients:  140 - 180 mg/dL   Lab Results  Component Value Date   GLUCAP 200 (H) 03/02/2024   HGBA1C 8.8 (H) 03/01/2024    Diabetes history: DM Outpatient Diabetes medications: Novolin R sliding scale prn (states ran out of insulin  approx. 1 week ago) Current orders for Inpatient glycemic control: IV insulin   Inpatient Diabetes Program Recommendations:   Consulted transition of care regarding no insurance and need for insulin . I will speak with patient as well. [employer] has a program that lowers cost of basaglar, Novolog  and Humalog 75/25 to $35 each for cash pay.   Spoke with patient regarding what insulin  regimen he has been doing. Patient has only been taking Novolin R from [facility] and attempting to take according to needs with carbohydrates. A1c 8.8. Patient applied for Medicaid last week but has not heard an update yet but has not been working. If no other plans for insulin  available would recommend using [employer] coupon for Basaglar and Humalog combination regimen on discharge. Each prescription per month should be $35 so total monthly would be $70. Patient has been purchasing 5-6 Novolin R vials from [facility] monthly so has been paying approximately $125-150 per month for 5-6 vials. Patient has glucose meter @ home but limited strips to use. Plans to purchase additional strips from [facility].  Educated patient on insulin  pen use at home. Reviewed contents of insulin  flexpen starter kit. Reviewed all steps if insulin  pen including attachment of needle, 2-unit air shot, dialing up dose, giving injection, removing needle, disposal of sharps, storage of unused insulin , disposal of insulin  etc. Patient feels confident in use of insulin  pen due to  having used in the past. Also reviewed troubleshooting with insulin  pen. MD to give patient Rxs for insulin  pens and insulin  pen needles.   Thank you, Raelyn Racette E. Arelene Moroni, RN, MSN, CDCES  Diabetes Coordinator Inpatient Glycemic Control Team Team Pager [phone] (8am-5pm) 03/02/2024 9:54 AM

## 2024-03-02 NOTE — Plan of Care (Signed)
 Problem: Education: Goal: Ability to describe self-care measures that may prevent or decrease complications (Diabetes Survival Skills Education) will improve 03/02/2024 1701 by Jeneen Mire, Billie Budge, RN Outcome: Progressing 03/02/2024 1701 by Jeneen Mire, Billie Budge, RN Outcome: Progressing Goal: Individualized Educational Video(s) 03/02/2024 1701 by Jeneen Mire, Billie Budge, RN Outcome: Progressing 03/02/2024 1701 by Jeneen Mire, Billie Budge, RN Outcome: Progressing   Problem: Coping: Goal: Ability to adjust to condition or change in health will improve 03/02/2024 1701 by Jeneen Mire, Billie Budge, RN Outcome: Progressing 03/02/2024 1701 by Jeneen Mire, Billie Budge, RN Outcome: Progressing   Problem: Fluid Volume: Goal: Ability to maintain a balanced intake and output will improve 03/02/2024 1701 by Jeneen Mire, Billie Budge, RN Outcome: Progressing 03/02/2024 1701 by Jeneen Mire, Billie Budge, RN Outcome: Progressing   Problem: Health Behavior/Discharge Planning: Goal: Ability to identify and utilize available resources and services will improve 03/02/2024 1701 by Jeneen Mire, Billie Budge, RN Outcome: Progressing 03/02/2024 1701 by Jeneen Mire, Billie Budge, RN Outcome: Progressing Goal: Ability to manage health-related needs will improve 03/02/2024 1701 by Nereida Banning, RN Outcome: Progressing 03/02/2024 1701 by Jeneen Mire, Billie Budge, RN Outcome: Progressing   Problem: Metabolic: Goal: Ability to maintain appropriate glucose levels will improve 03/02/2024 1701 by Nereida Banning, RN Outcome: Progressing 03/02/2024 1701 by Jeneen Mire, Billie Budge, RN Outcome: Progressing   Problem: Nutritional: Goal: Maintenance of adequate nutrition will improve 03/02/2024 1701 by Nereida Banning, RN Outcome: Progressing 03/02/2024 1701 by Jeneen Mire, Billie Budge, RN Outcome: Progressing Goal: Progress toward achieving an optimal weight will improve 03/02/2024 1701 by  Nereida Banning, RN Outcome: Progressing 03/02/2024 1701 by Jeneen Mire, Billie Budge, RN Outcome: Progressing   Problem: Skin Integrity: Goal: Risk for impaired skin integrity will decrease 03/02/2024 1701 by Jeneen Mire, Billie Budge, RN Outcome: Progressing 03/02/2024 1701 by Jeneen Mire, Billie Budge, RN Outcome: Progressing   Problem: Tissue Perfusion: Goal: Adequacy of tissue perfusion will improve 03/02/2024 1701 by Nereida Banning, RN Outcome: Progressing 03/02/2024 1701 by Jeneen Mire, Billie Budge, RN Outcome: Progressing   Problem: Education: Goal: Ability to describe self-care measures that may prevent or decrease complications (Diabetes Survival Skills Education) will improve 03/02/2024 1701 by Nereida Banning, RN Outcome: Progressing 03/02/2024 1701 by Nereida Banning, RN Outcome: Progressing Goal: Individualized Educational Video(s) 03/02/2024 1701 by Jeneen Mire, Billie Budge, RN Outcome: Progressing 03/02/2024 1701 by Jeneen Mire, Billie Budge, RN Outcome: Progressing   Problem: Cardiac: Goal: Ability to maintain an adequate cardiac output will improve 03/02/2024 1701 by Jeneen Mire, Billie Budge, RN Outcome: Progressing 03/02/2024 1701 by Jeneen Mire, Billie Budge, RN Outcome: Progressing   Problem: Health Behavior/Discharge Planning: Goal: Ability to identify and utilize available resources and services will improve 03/02/2024 1701 by Jeneen Mire, Billie Budge, RN Outcome: Progressing 03/02/2024 1701 by Jeneen Mire, Billie Budge, RN Outcome: Progressing Goal: Ability to manage health-related needs will improve 03/02/2024 1701 by Nereida Banning, RN Outcome: Progressing 03/02/2024 1701 by Jeneen Mire, Billie Budge, RN Outcome: Progressing   Problem: Fluid Volume: Goal: Ability to achieve a balanced intake and output will improve 03/02/2024 1701 by Nereida Banning, RN Outcome: Progressing 03/02/2024 1701 by Jeneen Mire, Billie Budge,  RN Outcome: Progressing   Problem: Metabolic: Goal: Ability to maintain appropriate glucose levels will improve 03/02/2024 1701 by Nereida Banning, RN Outcome: Progressing 03/02/2024 1701 by Jeneen Mire, Billie Budge, RN Outcome: Progressing   Problem: Nutritional: Goal: Maintenance of adequate nutrition will improve 03/02/2024 1701 by Nereida Banning, RN Outcome: Progressing 03/02/2024 1701 by Jeneen Mire, Billie Budge, RN Outcome: Progressing Goal: Maintenance of adequate weight for body size and type  will improve 03/02/2024 1701 by Jeneen Mire, Billie Budge, RN Outcome: Progressing 03/02/2024 1701 by Jeneen Mire, Billie Budge, RN Outcome: Progressing   Problem: Respiratory: Goal: Will regain and/or maintain adequate ventilation 03/02/2024 1701 by Jeneen Mire, Billie Budge, RN Outcome: Progressing 03/02/2024 1701 by Jeneen Mire, Billie Budge, RN Outcome: Progressing   Problem: Urinary Elimination: Goal: Ability to achieve and maintain adequate renal perfusion and functioning will improve 03/02/2024 1701 by Jeneen Mire, Billie Budge, RN Outcome: Progressing 03/02/2024 1701 by Jeneen Mire, Billie Budge, RN Outcome: Progressing   Problem: Education: Goal: Knowledge of General Education information will improve Description: Including pain rating scale, medication(s)/side effects and non-pharmacologic comfort measures 03/02/2024 1701 by Nereida Banning, RN Outcome: Progressing 03/02/2024 1701 by Jeneen Mire, Billie Budge, RN Outcome: Progressing   Problem: Health Behavior/Discharge Planning: Goal: Ability to manage health-related needs will improve 03/02/2024 1701 by Nereida Banning, RN Outcome: Progressing 03/02/2024 1701 by Jeneen Mire, Billie Budge, RN Outcome: Progressing   Problem: Clinical Measurements: Goal: Ability to maintain clinical measurements within normal limits will improve 03/02/2024 1701 by Jeneen Mire, Billie Budge, RN Outcome: Progressing 03/02/2024  1701 by Jeneen Mire, Billie Budge, RN Outcome: Progressing Goal: Will remain free from infection 03/02/2024 1701 by Nereida Banning, RN Outcome: Progressing 03/02/2024 1701 by Jeneen Mire, Billie Budge, RN Outcome: Progressing Goal: Diagnostic test results will improve 03/02/2024 1701 by Nereida Banning, RN Outcome: Progressing 03/02/2024 1701 by Jeneen Mire, Billie Budge, RN Outcome: Progressing Goal: Respiratory complications will improve 03/02/2024 1701 by Nereida Banning, RN Outcome: Progressing 03/02/2024 1701 by Jeneen Mire, Billie Budge, RN Outcome: Progressing Goal: Cardiovascular complication will be avoided 03/02/2024 1701 by Nereida Banning, RN Outcome: Progressing 03/02/2024 1701 by Nereida Banning, RN Outcome: Progressing   Problem: Activity: Goal: Risk for activity intolerance will decrease 03/02/2024 1701 by Jeneen Mire, Billie Budge, RN Outcome: Progressing 03/02/2024 1701 by Jeneen Mire, Billie Budge, RN Outcome: Progressing   Problem: Nutrition: Goal: Adequate nutrition will be maintained 03/02/2024 1701 by Jeneen Mire, Billie Budge, RN Outcome: Progressing 03/02/2024 1701 by Jeneen Mire, Billie Budge, RN Outcome: Progressing   Problem: Coping: Goal: Level of anxiety will decrease 03/02/2024 1701 by Jeneen Mire, Billie Budge, RN Outcome: Progressing 03/02/2024 1701 by Jeneen Mire, Billie Budge, RN Outcome: Progressing   Problem: Elimination: Goal: Will not experience complications related to bowel motility 03/02/2024 1701 by Nereida Banning, RN Outcome: Progressing 03/02/2024 1701 by Jeneen Mire, Billie Budge, RN Outcome: Progressing Goal: Will not experience complications related to urinary retention 03/02/2024 1701 by Nereida Banning, RN Outcome: Progressing 03/02/2024 1701 by Jeneen Mire, Billie Budge, RN Outcome: Progressing   Problem: Pain Managment: Goal: General experience of comfort will improve and/or be  controlled 03/02/2024 1701 by Nereida Banning, RN Outcome: Progressing 03/02/2024 1701 by Jeneen Mire, Billie Budge, RN Outcome: Progressing   Problem: Safety: Goal: Ability to remain free from injury will improve 03/02/2024 1701 by Nereida Banning, RN Outcome: Progressing 03/02/2024 1701 by Jeneen Mire, Billie Budge, RN Outcome: Progressing   Problem: Skin Integrity: Goal: Risk for impaired skin integrity will decrease 03/02/2024 1701 by Nereida Banning, RN Outcome: Progressing 03/02/2024 1701 by Nereida Banning, RN Outcome: Progressing

## 2024-03-02 NOTE — Progress Notes (Addendum)
 PROGRESS NOTE    Franklin Douglas  UJW:[id number] DOB: 09/11/1971 DOA: 03/01/2024 PCP: [facility]  Chief Complaint  Patient presents with   Hyperglycemia   Suicidal    Hospital Course:  Patient is a 53 year old male with history of insulin -dependent type 2 diabetes, diabetic neuropathy, hypertension, CAD status post PCI and stenting in 2017, hyperlipidemia, who presented to the ED complaining of nausea, vomiting, and reported he ran out of his insulin .  Patient reports insulin  became cost prohibitive and he was unable to afford it.  He reports his last insulin  was 10 days prior to arrival.  In the ED he was tachycardic, blood glucose 490, BUN 29, creatinine 1.5, bicarb 11, potassium 4.9.  He was given IV fluids and admitted to stepdown unit on insulin  drip for treatment of DKA  Subjective: This morning patient reports he is feeling much better, denies any nausea, vomiting.  He is endorsing an appetite.   Objective: Vitals:   03/02/24 1000 03/02/24 1100 03/02/24 1113 03/02/24 1210  BP: (!) 156/95 (!) 151/82  135/81  Pulse: (!) 105 98  90  Resp: (!) 28 19  19   Temp:   98.5 F (36.9 C)   TempSrc:   Oral   SpO2: 97% 94%  96%  Weight:      Height:        Intake/Output Summary (Last 24 hours) at 03/02/2024 1645 Last data filed at 03/02/2024 1501 Gross per 24 hour  Intake 6188.51 ml  Output 1750 ml  Net 4438.51 ml   Filed Weights   03/01/24 1436 03/01/24 1842  Weight: 106.6 kg 100.2 kg    Examination: General exam: Appears calm and comfortable, NAD  Respiratory system: No work of breathing, symmetric chest wall expansion Cardiovascular system: S1 & S2 heard, RRR.  Gastrointestinal system: Abdomen is nondistended, soft and nontender.  Neuro: Alert and oriented. No focal neurological deficits. Extremities: Symmetric, expected ROM Skin: No rashes, lesions Psychiatry: Demonstrates appropriate judgement and insight. Mood & affect appropriate for situation.    Assessment & Plan:  Principal Problem:   DKA (diabetic ketoacidosis) (HCC) Active Problems:   DKA, type 2 (HCC)   DKA Type 2 diabetes uncontrolled with hyperglycemia - Hemoglobin A1c 8.8% - DKA Secondary to nonadherence with insulin  as it was cost prohibitive - No signs or symptoms of infection. - Have consulted TOC who will assist in getting low cost medications for the patient - Still requiring insulin  drip today.  Continue with Endo tool - Anion gap closing now. - Start with 20 units glargine bridge, aspart medium scale. - Add carb controlled diet - Continue with LR - Follow CBGs closely, titrate insulin  as needed  Type 2 diabetes - Have reached out to [facility] for more affordable insulin  - Will recalculate insulin  needs but no longer requiring insulin  drip  Anxiety Depression - Patient reportedly no suicidal ideation to EDP.  Psychiatry was consulted by EDP - Patient denies any SI to admitting physician. - No complaints today  CAD, prior CABG - Continue Plavix, statin, Coreg  - Echocardiogram ordered, large volume fluid resuscitation during this admission  Chest pain - Patient began experiencing substernal chest pain this afternoon after taking potassium.  Was given Maalox with minimal improvement. - EKG unconcerning - Will obtain high-sensitivity troponin given CAD history  Hypertension - Stable on current meds.  Continue - Add ACE inhibitor if needed   DVT prophylaxis: Heparin    Code Status: Full Code Disposition:  ICU on insulin  drip. Eventually discharge home.  Consultants:    Procedures:    Antimicrobials:  Anti-infectives (From admission, onward)    None       Data Reviewed: I have personally reviewed following labs and imaging studies CBC: Recent Labs  Lab 03/01/24 1442  WBC 6.9  HGB 17.1*  HCT 51.1  MCV 91.1  PLT 208   Basic Metabolic Panel: Recent Labs  Lab 03/01/24 2140 03/02/24 0109 03/02/24 0504 03/02/24 0915 03/02/24 1327   NA 137 137 136 138 137  138  K 4.4 4.1 3.7 3.8 3.6  3.3*  CL 110 110 110 110 111  111  CO2 13* 18* 17* 24 20*  22  GLUCOSE 254* 222* 221* 206* 184*  194*  BUN 26* 24* 22* 20 18  18   CREATININE 1.19 1.12 0.97 1.01 0.93  0.93  CALCIUM  9.0 9.0 8.7* 8.7* 8.7*  8.6*   GFR: Estimated Creatinine Clearance: 111.7 mL/min (by C-G formula based on SCr of 0.93 mg/dL). Liver Function Tests: Recent Labs  Lab 03/01/24 1442  AST 17  ALT 29  ALKPHOS 78  BILITOT 1.8*  PROT 7.2  ALBUMIN 4.1   CBG: Recent Labs  Lab 03/02/24 1110 03/02/24 1317 03/02/24 1412 03/02/24 1504 03/02/24 1615  GLUCAP 180* 190* 164* 166* 194*    Recent Results (from the past 240 hours)  MRSA Next Gen by PCR, Nasal     Status: None   Collection Time: 03/01/24  6:55 PM   Specimen: Nasal Mucosa; Nasal Swab  Result Value Ref Range Status   MRSA by PCR Next Gen NOT DETECTED NOT DETECTED Final    Comment: (NOTE) The GeneXpert MRSA Assay (FDA approved for NASAL specimens only), is one component of a comprehensive MRSA colonization surveillance program. It is not intended to diagnose MRSA infection nor to guide or monitor treatment for MRSA infections. Test performance is not FDA approved in patients less than 44 years old. Performed at [facility], [address] [place], [place] [address]      Radiology Studies: [facility] [place] 1 View Result Date: 03/01/2024 CLINICAL DATA:  Hyperglycemia.  Nausea vomiting since last night. EXAM: PORTABLE CHEST 1 VIEW COMPARISON:  01/12/2019. FINDINGS: Cardiac silhouette is normal in size and configuration. Stable left coronary artery stent. No mediastinal or hilar masses. Clear lungs.  No pleural effusion or pneumothorax. Skeletal structures are grossly intact. IMPRESSION: No active disease. Electronically Signed   By: Amanda Jungling M.D.   On: 03/01/2024 16:10    Scheduled Meds:  aspirin  EC  81 mg Oral Daily   atorvastatin   80 mg Oral Daily   carvedilol    12.5 mg Oral BID   Chlorhexidine Gluconate Cloth  6 each Topical Daily   clopidogrel  75 mg Oral Daily   furosemide  20 mg Oral BID   Continuous Infusions:  insulin  3.8 Units/hr (03/02/24 1520)   promethazine (PHENERGAN) injection (IM or IVPB) Stopped (03/01/24 2208)     LOS: 1 day  MDM: Patient is high risk for one or more organ failure.  They necessitate ongoing hospitalization for continued IV therapies and subsequent lab monitoring. Total time spent interpreting labs and vitals, reviewing the medical record, coordinating care amongst consultants and care team members, directly assessing and discussing care with the patient and/or family: 55 min  Ramon Zanders, DO [facility]  To contact the attending physician between 7A-7P please use Epic Chat. To contact the covering physician during after hours 7P-7A, please review Amion.  03/02/2024, 4:45 PM   *This  document has been created with the assistance of dictation software. Please excuse typographical errors. *

## 2024-03-02 NOTE — Plan of Care (Signed)

## 2024-03-02 NOTE — Progress Notes (Signed)
*  PRELIMINARY RESULTS* Echocardiogram 2D Echocardiogram has been performed.  Broadus Canes 03/02/2024, 2:46 PM

## 2024-03-02 NOTE — Progress Notes (Signed)
 Maalox given as the patient started to report chest pain 5/10. 12 LED EKG was obtained and uploaded to EPIC. The hospitalist has seen the EKG results. The patient continued to report pain 5/10. MD has order Norco and to continue to monitor the patient.

## 2024-03-02 NOTE — Plan of Care (Signed)
  Problem: Coping: Goal: Ability to adjust to condition or change in health will improve Outcome: Progressing   Problem: Health Behavior/Discharge Planning: Goal: Ability to identify and utilize available resources and services will improve Outcome: Progressing Goal: Ability to manage health-related needs will improve Outcome: Progressing   Problem: Skin Integrity: Goal: Risk for impaired skin integrity will decrease Outcome: Progressing   Problem: Education: Goal: Ability to describe self-care measures that may prevent or decrease complications (Diabetes Survival Skills Education) will improve Outcome: Progressing   Problem: Cardiac: Goal: Ability to maintain an adequate cardiac output will improve Outcome: Progressing

## 2024-03-02 NOTE — Consult Note (Signed)
 [facility] Psychiatric Consult Initial  Patient Name: .Franklin Douglas  MRN: [id number]  DOB: 1970-11-15  Consult Order details:  Orders (From admission, onward)     Start     Ordered   03/01/24 1618  IP CONSULT TO PSYCHIATRY       Ordering Provider: Twilla Galea, MD  Provider:  (Not yet assigned)  Question Answer Comment  Place call to: [phone]   Reason for Consult Admit      03/01/24 1617             Mode of Visit: In person    Psychiatry Consult Evaluation  Service Date: Mar 02, 2024 LOS:  LOS: 1 day  Chief Complaint "I have always felt suicidal, but I will never kill myself" Chart review:   Franklin Douglas is a 53 y.o. male with medical history significant of IIDM, diabetic neuropathy, HTN, CAD status post PCI and stenting 2017, HLD, presented to ED with DKA.   Due to insurance problems, patient ran out of insulin  10 days ago.  Gradually he started to develop feeling of nausea and started to vomit yesterday evening, he denied any abdominal pain no diarrhea no fever or chills.  Patient is evaluated face-to-face today 03/02/2024. He is in bed awake. Cooperative upon approach. Appears calm and has good hygiene.  Alert and oriented x 4. He does not appear to be preoccupied or responding to internal stimuli. Speech is clear and well articulated. Pt uses sense of humor.  Patient denies hx of mental illness but reports he has been feeling depressed all his life "but I will never kill myself". States "I know it can be worse".  Sates "I have always felt like I am a coward". Patient states he has a lot going on: his girlfriend who is primary support is physically disabled and he had to quit his job to take care of her. Reports he has 3 children but they don't talk to him. "My children hate me". States he never knew his father. States his mother lives in [place]  and not very supportive.  Patient reports he ran out of his insulin  and had a crisis.  CBG 442 upon admission. Patient  denies HI/AVH, past or present. Denies substance use. UDS ordered.  Patient reports no hx of suicide attempt and never had a plan. States he would not kill himself because he has good support from his girlfriend.      Primary Psychiatric Diagnoses  Suicidal ideations 2.  MDD   Assessment  Franklin Douglas is a 53 y.o. male admitted: Medicallyfor 03/01/2024  3:32 PM for DKA. He reports no  psychiatric diagnoses..   His current presentation of depressive symptoms  is most consistent with reported current stressors including unemployment, taking care of his disabled girlfriend, and not feeling supported by his family. He reports passive suicidal ideations related to current situation but states he will never kill himself.  No  current outpatient psychotropic medications. . Reports he ran out of insulin  and was unable to control his DM. Reports no plan to act on his SI.  Please see plan below for detailed recommendations.   Diagnoses:  Active Hospital problems: Principal Problem:   DKA (diabetic ketoacidosis) (HCC) Active Problems:   DKA, type 2 (HCC)   Suicidal ideations    Plan   ## Psychiatric Medication Recommendations:  We will start Zoloft  25 mg PO Daily and will increase to 50 mg if tolerated, to address his depressive mood  ## Medical  Decision Making Capacity: Not specifically addressed in this encounter  ## Further Work-up:   EKG -- most recent EKG on : NA -- Pertinent labwork reviewed earlier this admission includes: All labs reviewed   ## Disposition:-- Plan Post Discharge/Psychiatric Care Follow-up resources : Patient may benefit from outpatient mental health services  ## Behavioral / Environmental: - No specific recommendations at this time.     ## Safety and Observation Level:  - Based on my clinical evaluation, I estimate the patient to be at low risk of self harm in the current setting. - At this time, we recommend  routine. This decision is based on my review  of the chart including patient's history and current presentation, interview of the patient, mental status examination, and consideration of suicide risk including evaluating suicidal ideation, plan, intent, suicidal or self-harm behaviors, risk factors, and protective factors. This judgment is based on our ability to directly address suicide risk, implement suicide prevention strategies, and develop a safety plan while the patient is in the clinical setting. Please contact our team if there is a concern that risk level has changed.  CSSR Risk Category:C-SSRS RISK CATEGORY: Error: Q7 should not be populated when Q6 is No  Suicide Risk Assessment: Patient has following modifiable risk factors for suicide: untreated depression, which we are addressing by Initiating an antidepressant. . Patient has following non-modifiable or demographic risk factors for suicide: male gender Patient has the following protective factors against suicide: no history of suicide attempts and no history of NSSIB  Thank you for this consult request. Recommendations have been communicated to the primary team.  We will add Zoloft 25 mg PO daily to current medication regimen  at this time, and will increase to 50 mg if tolerated. We will continue to assess.   Elston Halsted, NP       History of Present Illness  Relevant Aspects of [facility] Course:  Admitted on 03/01/2024.   Patient Report:  "I will never kill myself"  Psych ROS:  Depression: current Anxiety:  NA Mania (lifetime and current): NA Psychosis: (lifetime and current): NA  Collateral information:  Contacted : NA  Review of Systems  Constitutional: Negative.   HENT: Negative.    Eyes: Negative.   Respiratory: Negative.    Cardiovascular: Negative.   Gastrointestinal: Negative.   Genitourinary: Negative.   Musculoskeletal: Negative.   Skin: Negative.   Neurological: Negative.   Endo/Heme/Allergies: Negative.   Psychiatric/Behavioral:   Positive for depression and suicidal ideas.      Psychiatric and Social History  Psychiatric History:  Information collected from Patient   Prev Dx/Sx: NA Current Psych Provider: NA Home Meds (current): NA Previous Med Trials: NA Therapy: NA  Prior Psych Hospitalization: NA  Prior Self Harm: NA Prior Violence: NA  Family Psych History: NA Family Hx suicide: NA  Social History:  Developmental Hx: NA Educational Hx: NA Occupational Hx: Currently unemployed. Quit his job to care for his girlfriend Legal Hx: NA Living Situation: Lives with girlfriend Spiritual Hx: NA Access to weapons/lethal means: NA   Substance History Alcohol: NA  Type of alcohol NA Last Drink NA Number of drinks per day NA History of alcohol withdrawal seizures NA History of DT's NA Tobacco: NA Illicit drugs: NA Prescription drug abuse: NA Rehab hx: NA  Exam Findings  Physical Exam:  Vital Signs:  Temp:  [98.2 F (36.8 C)-98.8 F (37.1 C)] 98.8 F (37.1 C) (05/29 1600) Pulse Rate:  [89-119] 97 (05/29 1600) Resp:  [  13-28] 13 (05/29 1600) BP: (121-170)/(68-107) 160/80 (05/29 1600) SpO2:  [92 %-97 %] 96 % (05/29 1600) Blood pressure (!) 160/80, pulse 97, temperature 98.8 F (37.1 C), temperature source Oral, resp. rate 13, height 5' 11.5" (1.816 m), weight 100.2 kg, SpO2 96%. Body mass index is 30.38 kg/m. Physical Exam Vitals and nursing note reviewed.  HENT:     Head: Normocephalic.     Right Ear: Tympanic membrane normal.     Left Ear: Tympanic membrane normal.     Nose: Nose normal.     Mouth/Throat:     Mouth: Mucous membranes are moist.  Eyes:     Extraocular Movements: Extraocular movements intact.     Pupils: Pupils are equal, round, and reactive to light.  Cardiovascular:     Rate and Rhythm: Normal rate.     Pulses: Normal pulses.  Pulmonary:     Effort: Pulmonary effort is normal.  Musculoskeletal:        General: Normal range of motion.     Cervical back: Normal  range of motion.  Neurological:     General: No focal deficit present.     Mental Status: He is alert and oriented to person, place, and time.     Mental Status Exam: General Appearance: Fairly Groomed  Orientation:  Full (Time, Place, and Person)  Memory:  Immediate;   Fair Recent;   Fair Remote;   Fair  Concentration:  Concentration: Fair and Attention Span: Fair  Recall:  Fair  Attention  Fair  Eye Contact:  Fair  Speech:  Clear and Coherent  Language:  Good  Volume:  Normal  Mood: depressed  Affect:  Depressed  Thought Process:  Coherent and Goal Directed  Thought Content:  WDL  Suicidal Thoughts:  Yes.  without intent/plan  Homicidal Thoughts:  No  Judgement:  Fair  Insight:  Fair  Psychomotor Activity:  Normal  Akathisia:  NA  Fund of Knowledge:  Fair      Assets:  Manufacturing systems engineer Desire for Improvement  Cognition:  WNL  ADL's:  Intact  AIMS (if indicated):        Other History   These have been pulled in through the EMR, reviewed, and updated if appropriate.  Family History:  The patient's family history includes Diabetes in his brother and mother.  Medical History: Past Medical History:  Diagnosis Date   Diabetes mellitus without complication (HCC)    Diabetic neuropathy (HCC)    Diverticula, colon    Diverticulitis    Sciatica     Surgical History: Past Surgical History:  Procedure Laterality Date   CARDIAC SURGERY     COLON SURGERY     HERNIA REPAIR       Medications:   Current Facility-Administered Medications:    alum & mag hydroxide-simeth (MAALOX/MYLANTA) 200-200-20 MG/5ML suspension 30 mL, 30 mL, Oral, Q4H PRN, Antoniette Batty T, MD, 30 mL at 03/02/24 1638   aspirin  EC tablet 81 mg, 81 mg, Oral, Daily, Antoniette Batty T, MD, 81 mg at 03/02/24 0818   atorvastatin  (LIPITOR) tablet 80 mg, 80 mg, Oral, Daily, Antoniette Batty T, MD, 80 mg at 03/02/24 1610   carvedilol  (COREG ) tablet 12.5 mg, 12.5 mg, Oral, BID, Zhang, Ping T, MD, 12.5 mg at  03/02/24 0820   Chlorhexidine Gluconate Cloth 2 % PADS 6 each, 6 each, Topical, Daily, Zhang, Ping T, MD, 6 each at 03/02/24 0900   clopidogrel (PLAVIX) tablet 75 mg, 75 mg, Oral, Daily, Zhang, Ping T,  MD, 75 mg at 03/02/24 0819   dextrose  50 % solution 0-50 mL, 0-50 mL, Intravenous, PRN, Jessup, Charles, MD   fluticasone  (FLONASE ) 50 MCG/ACT nasal spray 1 spray, 1 spray, Each Nare, BID PRN, [place], MD   furosemide  (LASIX ) tablet 20 mg, 20 mg, Oral, BID, Zhang, Ping T, MD, 20 mg at 03/02/24 1727   heparin  injection 5,000 Units, 5,000 Units, Subcutaneous, Q12H, Dezii, Alexandra, DO   HYDROcodone -acetaminophen  (NORCO/VICODIN) 5-325 MG per tablet 1 tablet, 1 tablet, Oral, Q6H PRN, Dezii, Alexandra, DO, 1 tablet at 03/02/24 1659   insulin  aspart (novoLOG ) injection 0-15 Units, 0-15 Units, Subcutaneous, TID WC, Dezii, Alexandra, DO   insulin  glargine-yfgn (SEMGLEE ) injection 15 Units, 15 Units, Subcutaneous, Daily, Dezii, Alexandra, DO, 15 Units at 03/02/24 1753   insulin  regular, human (MYXREDLIN ) 100 units/ 100 mL infusion, , Intravenous, Continuous, Twilla Galea, MD, Last Rate: 7 mL/hr at 03/02/24 1729, 7 Units/hr at 03/02/24 1729   lactated ringers  infusion, , Intravenous, Continuous, Dezii, Alexandra, DO, Last Rate: 100 mL/hr at 03/02/24 1751, New Bag at 03/02/24 1751   ondansetron  (ZOFRAN ) injection 4 mg, 4 mg, Intravenous, Q6H PRN, Antoniette Batty T, MD, 4 mg at 03/01/24 1847   Oral care mouth rinse, 15 mL, Mouth Rinse, PRN, Antoniette Batty T, MD   promethazine  (PHENERGAN ) 12.5 mg in sodium chloride  0.9 % 50 mL IVPB, 12.5 mg, Intravenous, Q6H PRN, Mansy, Anastasio Kaska, MD, Stopped at 03/01/24 2208   sodium chloride  (OCEAN) 0.65 % nasal spray 2 spray, 2 spray, Each Nare, Q2H PRN, [place], MD  Allergies: Allergies  Allergen Reactions   Sulfa Antibiotics Hives, Shortness Of Breath, Rash and Anaphylaxis   Ace Inhibitors     Cough    Elston Halsted, NP

## 2024-03-02 NOTE — Progress Notes (Signed)
 The patient reports his chest pain has resolved.

## 2024-03-03 LAB — CBC WITH DIFFERENTIAL/PLATELET
Abs Immature Granulocytes: 0.02 10*3/uL (ref 0.00–0.07)
Basophils Absolute: 0 10*3/uL (ref 0.0–0.1)
Basophils Relative: 0 %
Eosinophils Absolute: 0 10*3/uL (ref 0.0–0.5)
Eosinophils Relative: 0 %
HCT: 38 % — ABNORMAL LOW (ref 39.0–52.0)
Hemoglobin: 13.3 g/dL (ref 13.0–17.0)
Immature Granulocytes: 0 %
Lymphocytes Relative: 12 %
Lymphs Abs: 0.9 10*3/uL (ref 0.7–4.0)
MCH: 30.9 pg (ref 26.0–34.0)
MCHC: 35 g/dL (ref 30.0–36.0)
MCV: 88.4 fL (ref 80.0–100.0)
Monocytes Absolute: 0.5 10*3/uL (ref 0.1–1.0)
Monocytes Relative: 7 %
Neutro Abs: 5.8 10*3/uL (ref 1.7–7.7)
Neutrophils Relative %: 81 %
Platelets: 151 10*3/uL (ref 150–400)
RBC: 4.3 MIL/uL (ref 4.22–5.81)
RDW: 13.1 % (ref 11.5–15.5)
WBC: 7.2 10*3/uL (ref 4.0–10.5)
nRBC: 0 % (ref 0.0–0.2)

## 2024-03-03 LAB — ECHOCARDIOGRAM COMPLETE
AR max vel: 3.03 cm2
AV Area VTI: 4.1 cm2
AV Area mean vel: 3.36 cm2
AV Mean grad: 3 mmHg
AV Peak grad: 5.2 mmHg
Ao pk vel: 1.14 m/s
Area-P 1/2: 3.95 cm2
Calc EF: 40.2 %
Height: 71.5 in
MV VTI: 3.14 cm2
S' Lateral: 3.5 cm
Single Plane A2C EF: 42.1 %
Single Plane A4C EF: 42.5 %
Weight: 3534.41 [oz_av]

## 2024-03-03 LAB — COMPREHENSIVE METABOLIC PANEL WITH GFR
ALT: 16 U/L (ref 0–44)
AST: 12 U/L — ABNORMAL LOW (ref 15–41)
Albumin: 3.1 g/dL — ABNORMAL LOW (ref 3.5–5.0)
Alkaline Phosphatase: 58 U/L (ref 38–126)
Anion gap: 7 (ref 5–15)
BUN: 16 mg/dL (ref 6–20)
CO2: 17 mmol/L — ABNORMAL LOW (ref 22–32)
Calcium: 8.1 mg/dL — ABNORMAL LOW (ref 8.9–10.3)
Chloride: 105 mmol/L (ref 98–111)
Creatinine, Ser: 0.86 mg/dL (ref 0.61–1.24)
GFR, Estimated: >60 mL/min (ref >60)
Glucose, Bld: 298 mg/dL — ABNORMAL HIGH (ref 70–99)
Potassium: 3.6 mmol/L (ref 3.5–5.1)
Sodium: 132 mmol/L — ABNORMAL LOW (ref 135–145)
Total Bilirubin: 1.4 mg/dL — ABNORMAL HIGH (ref 0.0–1.2)
Total Protein: 5.6 g/dL — ABNORMAL LOW (ref 6.5–8.1)

## 2024-03-03 LAB — URINE DRUG SCREEN, QUALITATIVE (ARMC ONLY)
Amphetamines, Ur Screen: NOT DETECTED
Barbiturates, Ur Screen: NOT DETECTED
Benzodiazepine, Ur Scrn: NOT DETECTED
Cannabinoid 50 Ng, Ur ~~LOC~~: NOT DETECTED
Cocaine Metabolite,Ur ~~LOC~~: NOT DETECTED
MDMA (Ecstasy)Ur Screen: NOT DETECTED
Methadone Scn, Ur: NOT DETECTED
Opiate, Ur Screen: NOT DETECTED
Phencyclidine (PCP) Ur S: NOT DETECTED
Tricyclic, Ur Screen: NOT DETECTED

## 2024-03-03 LAB — PHOSPHORUS: Phosphorus: 1.7 mg/dL — ABNORMAL LOW (ref 2.5–4.6)

## 2024-03-03 LAB — MAGNESIUM: Magnesium: 1.8 mg/dL (ref 1.7–2.4)

## 2024-03-03 LAB — GLUCOSE, CAPILLARY: Glucose-Capillary: 291 mg/dL — ABNORMAL HIGH (ref 70–99)

## 2024-03-03 MED ORDER — INSULIN GLARGINE-YFGN 100 UNIT/ML ~~LOC~~ SOLN
20.0000 [IU] | Freq: Every day | SUBCUTANEOUS | Status: DC
  Filled 2024-03-03: qty 0.2

## 2024-03-03 MED ORDER — POTASSIUM PHOSPHATES 15 MMOLE/5ML IV SOLN
30.0000 mmol | Freq: Once | INTRAVENOUS | Status: DC
  Filled 2024-03-03: qty 10

## 2024-03-03 NOTE — Discharge Summary (Addendum)
 Physician Discharge Summary   Patient: Franklin Douglas MRN: [id number] DOB: [birth year]  Admit date:     03/01/2024  Discharge date: 03/03/24 Left AMA    Discharge Physician: Roise Cleaver   PCP: [facility] [place] [facility]   Recommendations at discharge:    Needs PCP follow up for medication prescription and management  Discharge Diagnoses: Principal Problem:   DKA (diabetic ketoacidosis) (HCC) Active Problems:   DKA, type 2 (HCC)   Suicidal ideations  Resolved Problems:   * No resolved hospital problems. [facility] Course: Patient is a 53 year old male with history of insulin -dependent type 2 diabetes, diabetic neuropathy, hypertension, CAD status post PCI and stenting in 2017, hyperlipidemia, who presented to the ED complaining of nausea, vomiting, and reported he ran out of his insulin .  Patient reports insulin  became cost prohibitive and he was unable to afford it.  He reports his last insulin  was 10 days prior to arrival.  In the ED he was tachycardic, blood glucose 490, BUN 29, creatinine 1.5, bicarb 11, potassium 4.9.  He was given IV fluids and admitted to stepdown unit on insulin  drip for treatment of DKA. Anion gap closed and patient was able to be transitioned off of insulin  drip late in the evening on 5/29.  Early on 5/30 patient began experiencing nausea, vomiting, diarrhea.  Workup was broadened.  At the time of my evaluation on 5/30 patient reported he would be discharging home today.  We discussed that workup was ongoing and blood glucose was not well-controlled.  Patient was very agitated and reported that he would be leaving AGAINST MEDICAL ADVICE.  We discussed that he has high risk of recurrent  DKA, coma, or even death.  We discussed that if he leaves AGAINST MEDICAL ADVICE he will not have access to low cost or free insulin  as we previously discussed.  Patient endorses understanding and demands IV removal.  I attempted to elicit patient concerns which appear to be primarily  focused on the beeping of the IV but patient reports he is not interested in discussing his concerns and he is leaving regardless.  Patient then left the hospital AGAINST MEDICAL ADVICE  Diet recommendation:  Carb modified diet DISCHARGE MEDICATION:    Discharge Exam: Filed Weights   03/01/24 1436 03/01/24 1842  Weight: 106.6 kg 100.2 kg   Constitutional:  Normal appearance. Non toxic-appearing. Agitated. On the phone saying, "I am leaving this fucking hospital right now. These idiots are fucking around and pulling reasons for me to stay out of their assess."  HENT: Head Normocephalic and atraumatic.  Mucous membranes are moist.  Eyes:  Extraocular intact. Conjunctivae normal. Pupils are equal, round, and reactive to light.  Pulmonary: Non labored, symmetric rise of chest wall.  Musculoskeletal:  Normal range of motion.  Skin: warm and dry. not jaundiced.  Neurological: No focal deficit present. alert. Oriented x3. Psychiatric: Agitated. Increasingly aggressive    Condition at discharge: stable  The results of significant diagnostics from this hospitalization (including imaging, microbiology, ancillary and laboratory) are listed below for reference.   Imaging Studies: DG Chest Port 1 View Result Date: 03/01/2024 CLINICAL DATA:  Hyperglycemia.  Nausea vomiting since last night. EXAM: PORTABLE CHEST 1 VIEW COMPARISON:  01/12/2019. FINDINGS: Cardiac silhouette is normal in size and configuration. Stable left coronary artery stent. No mediastinal or hilar masses. Clear lungs.  No pleural effusion or pneumothorax. Skeletal structures are grossly intact. IMPRESSION: No active disease. Electronically Signed   By: Gaylon Kea.D.  On: 03/01/2024 16:10    Microbiology: Results for orders placed or performed during the hospital encounter of 03/01/24  MRSA Next Gen by PCR, Nasal     Status: None   Collection Time: 03/01/24  6:55 PM   Specimen: Nasal Mucosa; Nasal Swab  Result Value Ref  Range Status   MRSA by PCR Next Gen NOT DETECTED NOT DETECTED Final    Comment: (NOTE) The GeneXpert MRSA Assay (FDA approved for NASAL specimens only), is one component of a comprehensive MRSA colonization surveillance program. It is not intended to diagnose MRSA infection nor to guide or monitor treatment for MRSA infections. Test performance is not FDA approved in patients less than 23 years old. Performed at [facility], [address] [place], [place] [address]     Labs: CBC: Recent Labs  Lab 03/01/24 1442 03/03/24 0432  WBC 6.9 7.2  NEUTROABS  --  5.8  HGB 17.1* 13.3  HCT 51.1 38.0*  MCV 91.1 88.4  PLT 208 151   Basic Metabolic Panel: Recent Labs  Lab 03/02/24 0504 03/02/24 0915 03/02/24 1327 03/02/24 2149 03/03/24 0432  NA 136 138 137  138 133* 132*  K 3.7 3.8 3.6  3.3* 3.7 3.6  CL 110 110 111  111 108 105  CO2 17* 24 20*  22 19* 17*  GLUCOSE 221* 206* 184*  194* 268* 298*  BUN 22* 20 18  18 15 16   CREATININE 0.97 1.01 0.93  0.93 0.94 0.86  CALCIUM  8.7* 8.7* 8.7*  8.6* 8.1* 8.1*  MG  --   --   --   --  1.8  PHOS  --   --   --   --  1.7*   Liver Function Tests: Recent Labs  Lab 03/01/24 1442 03/03/24 0432  AST 17 12*  ALT 29 16  ALKPHOS 78 58  BILITOT 1.8* 1.4*  PROT 7.2 5.6*  ALBUMIN 4.1 3.1*   CBG: Recent Labs  Lab 03/02/24 1711 03/02/24 1811 03/02/24 1912 03/02/24 2109 03/03/24 0735  GLUCAP 185* 166* 155* 241* 291*    Discharge time spent: 32 minutes.  Signed: Brenden Rudman, DO [facility] 03/03/2024

## 2024-03-03 NOTE — Inpatient Diabetes Management (Signed)
 Inpatient Diabetes Program Recommendations  AACE/ADA: New Consensus Statement on Inpatient Glycemic Control (2015)  Target Ranges:  Prepandial:   less than 140 mg/dL      Peak postprandial:   less than 180 mg/dL (1-2 hours)      Critically ill patients:  140 - 180 mg/dL   Lab Results  Component Value Date   GLUCAP 291 (H) 03/03/2024   HGBA1C 8.8 (H) 03/01/2024    Diabetes history: DM Outpatient Diabetes medications: Novolin R sliding scale prn (states ran out of insulin  approx. 1 week ago) Current orders for Inpatient glycemic control: Semglee 20 units daily Novolog  0-15 units tid correction  Inpatient Diabetes Program Recommendations:   Patient had been on Levemir  50 units previously when could afford basal insulin . Please consider: -Increase Semglee to 40 units daily or 20 units bid -Add Novolog  5 units tid meal coverage when patient able to eat 50% meals -Add Novolog  0-5 units hs correction  Thank you, Marily Shows E. Timmey Lamba, RN, MSN, CDCES  Diabetes Coordinator Inpatient Glycemic Control Team Team Pager [phone] (8am-5pm) 03/03/2024 9:06 AM

## 2024-03-03 NOTE — Progress Notes (Signed)
 Hospitalist has been notified that the patient had vomiting episode right after eating breakfast. The patient started to cough and proceed to vomit right after. of vomit was recorded. MD has also been notified that the patient has had 3 episodes of diarrhea in the last 24hrs. 2 episodes during dayshift yesterday and 1 episode overnight. Currently no new orders but provider reports she will follow up with the patient this morning.

## 2024-03-03 NOTE — Progress Notes (Signed)
 The patient has been transferred to 1A room 142. Report was given to Valayna, Charity fundraiser. Telemetry was notified of transfer.

## 2024-03-03 NOTE — Progress Notes (Signed)
 MD notified nurse that patient wanted to leave AMA. MD stated patient was informed of risks of leaving AMA. Patient signed AMA form. Patient left. No distress noted in patient.

## 2024-03-24 ENCOUNTER — Ambulatory Visit: Admitting: Physician Assistant

## 2024-03-24 ENCOUNTER — Other Ambulatory Visit: Payer: Self-pay | Admitting: Physician Assistant

## 2024-03-24 ENCOUNTER — Encounter: Payer: Self-pay | Admitting: Physician Assistant

## 2024-03-24 VITALS — BP 150/84 | HR 95 | Ht 71.5 in | Wt 229.0 lb

## 2024-03-24 DIAGNOSIS — I10 Essential (primary) hypertension: Secondary | ICD-10-CM

## 2024-03-24 DIAGNOSIS — E1169 Type 2 diabetes mellitus with other specified complication: Secondary | ICD-10-CM

## 2024-03-24 DIAGNOSIS — Z794 Long term (current) use of insulin: Secondary | ICD-10-CM | POA: Diagnosis not present

## 2024-03-24 DIAGNOSIS — E7800 Pure hypercholesterolemia, unspecified: Secondary | ICD-10-CM | POA: Diagnosis not present

## 2024-03-24 MED ORDER — FREESTYLE LIBRE 3 PLUS SENSOR MISC: 2 refills | Status: AC

## 2024-03-24 MED ORDER — FREESTYLE LIBRE 3 READER DEVI: 1.0000 | 0 refills | Status: AC

## 2024-03-24 MED ORDER — INSULIN REGULAR HUMAN 100 UNIT/ML IJ SOLN: 10.0000 [IU] | Freq: Three times a day (TID) | INTRAMUSCULAR | 11 refills | Status: DC

## 2024-03-24 NOTE — Progress Notes (Signed)
 Date:  03/24/2024   Name:  Franklin Douglas   DOB:  07/28/1971   MRN:  [id number]   Chief Complaint: New Patient (Initial Visit), Diabetes (Patient would like to get a refill on Novolin R. Patient is on a sliding scale taking 10-15 units TID.), and Hypertension  Diabetes  Hypertension   [name] is a pleasant and animated 53 year old male with a history of inadequately controlled insulin -dependent DM2, diabetic neuropathy, hypertension, CAD status post PCI and stenting in 2017, hyperlipidemia who has been lost to primary care for about 5 years, presenting new to our clinic today to establish care and refill insulin .  He takes only mealtime insulin  (Novolin R) and checks fingerstick glucose several times per day which varies between 130 and 300 mg/dL. He has never used a CGM but would be interested; unfortunately he does not have a smart phone.  He has used oral medications in the past including metformin and others, but these were other not well-tolerated or not effective.  He does not believe he has ever been tested for type 1.5/LADA.  At present, he reports taking no medications whatsoever aside from his mealtime insulin .  He is quite intentional about food quality, quantity, and timing.  Importantly he was recently admitted at [facility] 03/01/2024 for DKA, but left AMA on account of needing to take care of his girlfriend who is dependent on him due to medical condition.  A1c during his admission was 8.8%  Today he feels well for the most part, mainly concerned with refilling his insulin .  Feels to be at his baseline.  Medication list has been reviewed and updated.  Current Meds  Medication Sig   Continuous Glucose Receiver (FREESTYLE LIBRE 3 READER) DEVI 1 each by Does not apply route as directed.   Continuous Glucose Sensor (FREESTYLE LIBRE 3 PLUS SENSOR) MISC Change sensor every 15 days.   insulin  regular (NOVOLIN R) 100 units/mL injection Inject 0.1-0.15 mLs (10-15 Units total) into the skin  3 (three) times daily before meals.   [DISCONTINUED] insulin  aspart (NOVOLOG ) 100 UNIT/ML injection Inject 3-6 Units into the skin See admin instructions. Sliding scale 130-160=1unit, 161-190=2units, 191-220=3units...Franklin AasAaron AasAaron Aasevery 30points for BS reading equal 1 unit (Patient taking differently: Inject 10-15 Units into the skin 3 (three) times daily with meals. Sliding scale 130-160=1unit, 161-190=2units, 191-220=3units...Franklin AasAaron AasAaron Aasevery 30points for BS reading equal 1 unit)     Review of Systems  Patient Active Problem List   Diagnosis Date Noted   History of diabetic ketoacidosis 03/01/2024   Atherosclerosis of native coronary artery of native heart with angina pectoris (HCC) 08/13/2016   Chronic systolic heart failure (HCC) 03/23/2016   Diabetic polyneuropathy associated with type 2 diabetes mellitus (HCC) 02/26/2016   AA (alcohol abuse) 12/02/2015   Peripheral neuropathic pain 12/02/2015   Erectile dysfunction 12/02/2015   Type 2 diabetes mellitus with other specified complication (HCC) 05/12/2013   Hypertension 05/12/2013   Pure hypercholesterolemia 05/12/2013   Diverticulosis 05/12/2013    Allergies  Allergen Reactions   Sulfa Antibiotics Hives, Shortness Of Breath, Rash and Anaphylaxis   Ace Inhibitors     Cough     There is no immunization history on file for this patient.  Past Surgical History:  Procedure Laterality Date   CARDIAC SURGERY     COLON SURGERY     HERNIA REPAIR      Social History   Tobacco Use   Smoking status: Never   Smokeless tobacco: Never  Vaping Use   Vaping status: Never Used  Substance Use Topics   Alcohol use: Yes    Alcohol/week: 20.0 standard drinks of alcohol    Types: 20 Shots of liquor per week    Comment: 8 drinks/ day   Drug use: No    Family History  Problem Relation Age of Onset   Diabetes Mother    Diabetes Brother         03/24/2024    1:50 PM  GAD 7 : Generalized Anxiety Score  Nervous, Anxious, on Edge 1  Control/stop  worrying 2  Worry too much - different things 2  Trouble relaxing 1  Restless 1  Easily annoyed or irritable 2  Afraid - awful might happen 1  Total GAD 7 Score 10  Anxiety Difficulty Somewhat difficult       03/24/2024    1:50 PM 06/22/2016   12:03 PM  Depression screen PHQ 2/9  Decreased Interest 2 0  Down, Depressed, Hopeless 1 1  PHQ - 2 Score 3 1  Altered sleeping 1 1  Tired, decreased energy 2 1  Change in appetite 2 1  Feeling bad or failure about yourself  1 1  Trouble concentrating 1 1  Moving slowly or fidgety/restless 2 1  Suicidal thoughts 0 0   PHQ-9 Score 12 7  Difficult doing work/chores Very difficult Not difficult at all     Data saved with a previous flowsheet row definition    BP Readings from Last 3 Encounters:  03/24/24 (!) 150/84  03/03/24 139/74  06/06/19 (!) 148/97    Wt Readings from Last 3 Encounters:  03/24/24 229 lb (103.9 kg)  03/01/24 220 lb 14.4 oz (100.2 kg)  06/06/19 235 lb (106.6 kg)    BP (!) 150/84 (Cuff Size: Large)   Pulse 95   Ht 5' 11.5 (1.816 m)   Wt 229 lb (103.9 kg)   SpO2 96%   BMI 31.49 kg/m   Physical Exam Vitals and nursing note reviewed.  Constitutional:      Appearance: Normal appearance.   Cardiovascular:     Rate and Rhythm: Normal rate and regular rhythm.     Heart sounds: No murmur heard.    No friction rub. No gallop.  Pulmonary:     Effort: Pulmonary effort is normal.     Breath sounds: Normal breath sounds.  Abdominal:     General: There is no distension.   Musculoskeletal:        General: Normal range of motion.   Skin:    General: Skin is warm and dry.   Neurological:     Mental Status: He is alert and oriented to person, place, and time.     Gait: Gait is intact.   Psychiatric:        Mood and Affect: Mood and affect normal.     Recent Labs     Component Value Date/Time   NA 132 (L) 03/03/2024 0432   NA 136 (A) 11/15/2014 0000   NA 122 (L) 08/30/2013 0637   K 3.6 03/03/2024  0432   K 4.5 08/30/2013 0637   CL 105 03/03/2024 0432   CL 89 (L) 08/30/2013 0637   CO2 17 (L) 03/03/2024 0432   CO2 17 (L) 08/30/2013 0637   GLUCOSE 298 (H) 03/03/2024 0432   GLUCOSE 402 (H) 08/30/2013 0637   BUN 16 03/03/2024 0432   BUN 14 11/15/2014 0000   BUN 13 08/30/2013 0637   CREATININE 0.86 03/03/2024 0432   CREATININE 0.53 (L) 08/30/2013 7322  CALCIUM  8.1 (L) 03/03/2024 0432   CALCIUM  9.1 08/30/2013 0637   PROT 5.6 (L) 03/03/2024 0432   PROT 7.7 08/30/2013 0730   ALBUMIN 3.1 (L) 03/03/2024 0432   ALBUMIN 4.1 08/30/2013 0730   AST 12 (L) 03/03/2024 0432   AST 29 08/30/2013 0730   ALT 16 03/03/2024 0432   ALT 30 08/30/2013 0730   ALKPHOS 58 03/03/2024 0432   ALKPHOS 94 08/30/2013 0730   BILITOT 1.4 (H) 03/03/2024 0432   BILITOT 0.8 08/30/2013 0730   GFRNONAA >60 03/03/2024 0432   GFRNONAA >60 08/30/2013 0637   GFRAA >60 06/06/2019 0938   GFRAA >60 08/30/2013 0637    Lab Results  Component Value Date   WBC 7.2 03/03/2024   HGB 13.3 03/03/2024   HCT 38.0 (L) 03/03/2024   MCV 88.4 03/03/2024   PLT 151 03/03/2024   Lab Results  Component Value Date   HGBA1C 8.8 (H) 03/01/2024   Lab Results  Component Value Date   CHOL 166 01/13/2019   HDL 44 01/13/2019   LDLCALC 54 01/13/2019   TRIG 340 (H) 01/13/2019   CHOLHDL 3.8 01/13/2019   Lab Results  Component Value Date   TSH 2.68 02/20/2015     Assessment and Plan:  Type 2 diabetes mellitus with other specified complication, with long-term current use of insulin  [facility]) Assessment & Plan: This was the focus of today's visit.  We discussed options for pharmacotherapy including oral and injectable medications, but for now he would really just like to continue with his normal routine of mealtime insulin .  That said, he was agreeable to trial of CGM, so I am sending a prescription for sensors and a reader to his pharmacy.  Discussed that I think this will go a long way for him in terms of diabetic control, as he  will be able to identify glycemic trends and particular food triggers.  Orders: -     FreeStyle Libre 3 Plus Sensor; Change sensor every 15 days.  Dispense: 6 each; Refill: 2 -     FreeStyle Libre 3 Reader; 1 each by Does not apply route as directed.  Dispense: 1 each; Refill: 0 -     Insulin  Regular Human; Inject 0.1-0.15 mLs (10-15 Units total) into the skin 3 (three) times daily before meals.  Dispense: 10 mL; Refill: 11  Primary hypertension Assessment & Plan: Elevated x 2 in clinic today, though patient is quite lively and a bit worked up describing his recent ordeal in the hospital.  Likely will need pharmacotherapy to address this problem, but patient advised to monitor blood pressure at home with a log for my review at short interval follow-up within the next month.  If persistently elevated at home, we will start pharmacotherapy.   Pure hypercholesterolemia Assessment & Plan: Patient advised we will probably want to restart statin medication especially in the context of history of MI and diabetes.  However, I do not want to overwhelm him at his first visit, so we will take his reintroduction to primary care 1 step at a time.      Return in about 3 weeks (around 04/14/2024) for OV f/u chronic conditions.    Cody Das, PA-C, DMSc, Nutritionist [facility] [phone]

## 2024-03-24 NOTE — Assessment & Plan Note (Signed)
 Elevated x 2 in clinic today, though patient is quite lively and a bit worked up describing his recent ordeal in the hospital.  Likely will need pharmacotherapy to address this problem, but patient advised to monitor blood pressure at home with a log for my review at short interval follow-up within the next month.  If persistently elevated at home, we will start pharmacotherapy.

## 2024-03-24 NOTE — Patient Instructions (Signed)
-  It was a pleasure to see you today! Please review your visit summary for helpful information -I would encourage you to follow your care via MyChart where you can access lab results, notes, messages, and more -If you feel that we did a nice job today, please complete your after-visit survey and leave [place] a Google review! Your CMA today was Mariann Barter and your provider was Alvester Morin, PA-C, DMSc -Please return for follow-up in about 3 weeks

## 2024-03-24 NOTE — Assessment & Plan Note (Signed)
 This was the focus of today's visit.  We discussed options for pharmacotherapy including oral and injectable medications, but for now he would really just like to continue with his normal routine of mealtime insulin .  That said, he was agreeable to trial of CGM, so I am sending a prescription for sensors and a reader to his pharmacy.  Discussed that I think this will go a long way for him in terms of diabetic control, as he will be able to identify glycemic trends and particular food triggers.

## 2024-03-24 NOTE — Assessment & Plan Note (Signed)
 Patient advised we will probably want to restart statin medication especially in the context of history of MI and diabetes.  However, I do not want to overwhelm him at his first visit, so we will take his reintroduction to primary care 1 step at a time.

## 2024-03-27 ENCOUNTER — Telehealth: Payer: Self-pay

## 2024-03-27 ENCOUNTER — Other Ambulatory Visit: Payer: Self-pay | Admitting: Physician Assistant

## 2024-03-27 DIAGNOSIS — Z794 Long term (current) use of insulin: Secondary | ICD-10-CM

## 2024-03-27 MED ORDER — INSULIN LISPRO 100 UNIT/ML IJ SOLN: 10.0000 [IU] | Freq: Three times a day (TID) | INTRAMUSCULAR | 11 refills | Status: DC

## 2024-03-27 NOTE — Telephone Encounter (Signed)
 Please complete PA  Freestyle Libre-  KEY: [id number]  Novolin-  KEY: [id number]

## 2024-03-27 NOTE — Telephone Encounter (Signed)
 PAs for freestyle sensors and reader have been submitted.  Switched Novolin to Humalog.  Advised Humalog as slightly faster acting compared to the Novolin, so glucose monitoring will be important following this change.  Informed patient via phone call, he has no further questions.

## 2024-03-27 NOTE — Telephone Encounter (Signed)
 I believe patient will need prior Auth for both the freestyle libre reader as well as the freestyle libre sensors.  Thank you.  Please disregard prior Auth for Novolin, as insurance prefers Humalog.

## 2024-03-27 NOTE — Telephone Encounter (Signed)
 Pt stated that Humalog is covered by insurance and not Novolin.  KP

## 2024-03-27 NOTE — Telephone Encounter (Signed)
 Copied from CRM [id number]. Topic: Clinical - Prescription Issue >> Mar 27, 2024  8:03 AM Antwanette L wrote: Reason for CRM: Patient is calling because Medicaid will not cover insulin  regular (NOVOLIN R) 100 units/mL injection. Please have Dr. Manya send in a new order for Humalog. Also please send a prior authorization to [facility] Medicaid Prepaid Health Plan [employer] for the Continuous Glucose Sensor (FREESTYLE LIBRE 3 PLUS SENSOR) and Continuous Glucose Receiver (FREESTYLE LIBRE 3 READER) DEVI. Please contact patient by phone at [phone]  Pharmacy Details [facility] [address] [place] [address] Phone: [phone]  Fax: [phone]

## 2024-03-28 NOTE — Telephone Encounter (Signed)
 Requested medication (s) are due for refill today: yes  Requested medication (s) are on the active medication list: yes  Last refill:  03/27/24  Future visit scheduled: yes  Notes to clinic:  Pharmacy comment: says not preferrred, try humulin.      Requested Prescriptions  Pending Prescriptions Disp Refills   NOVOLIN R 100 UNIT/ML injection [Pharmacy Med Name: NOVOLIN R 100UNIT/ML INJ] 10 mL 11    Sig: INEJCT 10-15 UNTIS INTO THE SKIN 3 TIMES DAILY BEFORE MEALS     Endocrinology:  Diabetes - Insulins Failed - 03/28/2024 10:38 AM      Failed - HBA1C is between 0 and 7.9 and within 180 days    Hemoglobin A1C  Date Value Ref Range Status  02/20/2015 6.8  Final   Hgb A1c MFr Bld  Date Value Ref Range Status  03/01/2024 8.8 (H) 4.8 - 5.6 % Final    Comment:    (NOTE) Diagnosis of Diabetes The following HbA1c ranges recommended by the American Diabetes Association (ADA) may be used as an aid in the diagnosis of diabetes mellitus.  Hemoglobin             Suggested A1C NGSP%              Diagnosis  <5.7                   Non Diabetic  5.7-6.4                Pre-Diabetic  >6.4                   Diabetic  <7.0                   Glycemic control for                       adults with diabetes.           Passed - Valid encounter within last 6 months    Recent Outpatient Visits           4 days ago Type 2 diabetes mellitus with other specified complication, with long-term current use of insulin  [facility])   [facility] at [facility], Toribio SQUIBB, [place]       Future Appointments             In 2 weeks Manya, Toribio SQUIBB, [place] [facility] Medicine at [facility], [facility]

## 2024-03-28 NOTE — Telephone Encounter (Signed)
 Please review.  KP

## 2024-03-29 ENCOUNTER — Other Ambulatory Visit (HOSPITAL_COMMUNITY): Payer: Self-pay

## 2024-03-29 ENCOUNTER — Telehealth: Payer: Self-pay | Admitting: Pharmacy Technician

## 2024-03-29 NOTE — Telephone Encounter (Signed)
 PA request has been Submitted. New Encounter has been or will be created for follow up. For additional info see Pharmacy Prior Auth telephone encounter from 03/29/24.

## 2024-03-29 NOTE — Telephone Encounter (Signed)
 Pharmacy Patient Advocate Encounter   Received notification from Pt Calls Messages that prior authorization for FreeStyle Libre 3 Reader device is required/requested.   Insurance verification completed.   The patient is insured through [facility] .   Per test claim: PA required; PA submitted to above mentioned insurance via CoverMyMeds Key/confirmation #/EOC [id number] Status is pending

## 2024-03-29 NOTE — Telephone Encounter (Signed)
 Pharmacy Patient Advocate Encounter   Received notification from Pt Calls Messages that prior authorization for FreeStyle Libre 3 Plus Sensor is required/requested.   Insurance verification completed.   The patient is insured through [facility] .   Per test claim: PA required and submitted KEY/EOC/Request #: [id number] from 03/29/24 to 09/25/24. Ran test claim, Copay is $0.00. This test claim was processed through [facility]- copay amounts may vary at other pharmacies due to pharmacy/plan contracts, or as the patient moves through the different stages of their insurance plan.  PA Case ID #: [id number]

## 2024-03-29 NOTE — Telephone Encounter (Signed)
 Noted  KP

## 2024-03-29 NOTE — Telephone Encounter (Signed)
 Pharmacy Patient Advocate Encounter  Received notification from [facility] that Prior Authorization for FreeStyle [place] 3 Reader device  has been DENIED.  Full denial letter will be uploaded to the media tab. See denial reason below.     PA #/Case ID/Reference #: [id number]  THE SENSORS WERE APPROVED BUT NOT THE READER, Franklin Douglas MAY HAVE TO DOWNLOAD THE APP TO USE WITH HIS SENSORS

## 2024-03-29 NOTE — Telephone Encounter (Signed)
 APPROVED from 03/29/24 to 09/25/24   [facility]  KP

## 2024-03-30 ENCOUNTER — Other Ambulatory Visit (HOSPITAL_COMMUNITY): Payer: Self-pay

## 2024-03-30 ENCOUNTER — Telehealth: Payer: Self-pay

## 2024-03-30 NOTE — Telephone Encounter (Signed)
 Spoke to Franklin Douglas let him know the reader was not covered. Told Franklin Douglas that he can use the sensor with a smart phone by downloading the app/ Franklin Douglas stated he does not have a smart phone Franklin Douglas uses a flip phone. Franklin Douglas stated he has a old phone that he may can download the app and  only use it when he is connected to wifi. Will see if the reader can be appealed. Franklin Douglas is going to try to see if his old phone can download the app and schedule an appointment if he has a hard time setting it up. Franklin Douglas is going to see if his old phone can download the app before he picks up the sensors. Will reach out to PA group.  KP

## 2024-03-30 NOTE — Telephone Encounter (Signed)
 Can this be appealed the patient does not have a smart phone. Pt has a flip phone.  KP

## 2024-03-30 NOTE — Telephone Encounter (Signed)
 Copied from CRM [id number]. Topic: Clinical - Prescription Issue >> Mar 30, 2024  9:22 AM Avram MATSU wrote: Reason for CRM: Continuous Glucose Receiver (FREESTYLE LIBRE 3 READER) DEVI [[id number]] [facility] not [facility] medication. It is showing it is not approved on their end as stated by the patient. Patient requested it be sent to a [place] pharmacy instead. Please contact patient at [phone]

## 2024-03-31 ENCOUNTER — Other Ambulatory Visit (HOSPITAL_COMMUNITY): Payer: Self-pay

## 2024-03-31 ENCOUNTER — Telehealth: Payer: Self-pay | Admitting: Physician Assistant

## 2024-03-31 NOTE — Telephone Encounter (Unsigned)
 Copied from CRM [id number]. Topic: Clinical - Medication Refill >> Mar 31, 2024 12:54 PM Montie POUR wrote: Medication: Continuous Glucose Sensor (FREESTYLE LIBRE 1 PLUS SENSOR) MISC His insurance should cover these and he can use these with his older phone.   Has the patient contacted their pharmacy? Yes (Agent: If no, request that the patient contact the pharmacy for the refill. If patient does not wish to contact the pharmacy document the reason why and proceed with request.) (Agent: If yes, when and what did the pharmacy advise?) Pharmacy needs order to fill  This is the patient's preferred pharmacy:  [facility] [address] [place] - [address] [place] [address] Phone: [phone] Fax: [phone]  Is this the correct pharmacy for this prescription? Yes If no, delete pharmacy and type the correct one.   Has the prescription been filled recently? No  Is the patient out of the medication? No  Has the patient been seen for an appointment in the last year OR does the patient have an upcoming appointment? Yes  Can we respond through MyChart? Yes  Agent: Please be advised that Rx refills may take up to 3 business days. We ask that you follow-up with your pharmacy.

## 2024-03-31 NOTE — Telephone Encounter (Signed)
 Good morning so actually his reader is covered, [employer] is really funny about the day supply and the day supply is 365 days and I did a test claim and his copay is $0.00 for the reader. Have a great weekend!!

## 2024-04-05 ENCOUNTER — Other Ambulatory Visit: Payer: Self-pay | Admitting: Internal Medicine

## 2024-04-05 ENCOUNTER — Telehealth: Payer: Self-pay

## 2024-04-05 DIAGNOSIS — E1169 Type 2 diabetes mellitus with other specified complication: Secondary | ICD-10-CM

## 2024-04-05 MED ORDER — INSULIN LISPRO 100 UNIT/ML IJ SOLN: 50.0000 [IU] | Freq: Three times a day (TID) | INTRAMUSCULAR | 0 refills | Status: DC

## 2024-04-05 NOTE — Telephone Encounter (Signed)
 FYI  KP

## 2024-04-05 NOTE — Telephone Encounter (Signed)
 Copied from CRM [id number]. Topic: Clinical - Prescription Issue >> Mar 27, 2024  8:03 AM Antwanette L wrote: Reason for CRM: Patient is calling because Medicaid will not cover insulin  regular (NOVOLIN R) 100 units/mL injection. Please have Dr. Manya send in a new order for Humalog . Also please send a prior authorization to [facility] Medicaid Prepaid Health Plan [employer] for the Continuous Glucose Sensor (FREESTYLE LIBRE 3 PLUS SENSOR) and Continuous Glucose Receiver (FREESTYLE LIBRE 3 READER) DEVI. Please contact patient by phone at [phone]  Pharmacy Details [facility] [address] [place] [address] Phone: [phone]  Fax: [phone] >> Apr 05, 2024 10:29 AM Emylou G wrote: Please call patient... running low in insulin  -/ issue on reader not being covered.. there is confusion .SABRA Possible rewrite on prescription.  His phone number [phone]

## 2024-04-05 NOTE — Progress Notes (Unsigned)
 Date:  04/05/2024   Name:  Franklin Douglas   DOB:  May 13, 1971   MRN:  [id number]   Chief Complaint: No chief complaint on file.  HPI  Review of Systems   Lab Results  Component Value Date   NA 132 (L) 03/03/2024   K 3.6 03/03/2024   CO2 17 (L) 03/03/2024   GLUCOSE 298 (H) 03/03/2024   BUN 16 03/03/2024   CREATININE 0.86 03/03/2024   CALCIUM  8.1 (L) 03/03/2024   GFRNONAA >60 03/03/2024   Lab Results  Component Value Date   CHOL 166 01/13/2019   HDL 44 01/13/2019   LDLCALC 54 01/13/2019   TRIG 340 (H) 01/13/2019   CHOLHDL 3.8 01/13/2019   Lab Results  Component Value Date   TSH 2.68 02/20/2015   Lab Results  Component Value Date   HGBA1C 8.8 (H) 03/01/2024   Lab Results  Component Value Date   WBC 7.2 03/03/2024   HGB 13.3 03/03/2024   HCT 38.0 (L) 03/03/2024   MCV 88.4 03/03/2024   PLT 151 03/03/2024   Lab Results  Component Value Date   ALT 16 03/03/2024   AST 12 (L) 03/03/2024   ALKPHOS 58 03/03/2024   BILITOT 1.4 (H) 03/03/2024   No results found for: MARIEN BOLLS, VD25OH   Patient Active Problem List   Diagnosis Date Noted   History of diabetic ketoacidosis 03/01/2024   Atherosclerosis of native coronary artery of native heart with angina pectoris (HCC) 08/13/2016   Chronic systolic heart failure (HCC) 03/23/2016   Diabetic polyneuropathy associated with type 2 diabetes mellitus (HCC) 02/26/2016   AA (alcohol abuse) 12/02/2015   Peripheral neuropathic pain 12/02/2015   Erectile dysfunction 12/02/2015   Type 2 diabetes mellitus with other specified complication (HCC) 05/12/2013   Hypertension 05/12/2013   Pure hypercholesterolemia 05/12/2013   Diverticulosis 05/12/2013    Allergies  Allergen Reactions   Sulfa Antibiotics Hives, Shortness Of Breath, Rash and Anaphylaxis   Ace Inhibitors     Cough    Past Surgical History:  Procedure Laterality Date   CARDIAC SURGERY     COLON SURGERY     HERNIA REPAIR      Social  History   Tobacco Use   Smoking status: Never   Smokeless tobacco: Never  Vaping Use   Vaping status: Never Used  Substance Use Topics   Alcohol use: Yes    Alcohol/week: 20.0 standard drinks of alcohol    Types: 20 Shots of liquor per week    Comment: 8 drinks/ day   Drug use: No     Medication list has been reviewed and updated.  No outpatient medications have been marked as taking for the 04/05/24 encounter (Orders Only) with Justus Leita DEL, MD.       03/24/2024    1:50 PM  GAD 7 : Generalized Anxiety Score  Nervous, Anxious, on Edge 1  Control/stop worrying 2  Worry too much - different things 2  Trouble relaxing 1  Restless 1  Easily annoyed or irritable 2  Afraid - awful might happen 1  Total GAD 7 Score 10  Anxiety Difficulty Somewhat difficult       03/24/2024    1:50 PM 06/22/2016   12:03 PM  Depression screen PHQ 2/9  Decreased Interest 2 0  Down, Depressed, Hopeless 1 1  PHQ - 2 Score 3 1  Altered sleeping 1 1  Tired, decreased energy 2 1  Change in appetite 2 1  Feeling  bad or failure about yourself  1 1  Trouble concentrating 1 1  Moving slowly or fidgety/restless 2 1  Suicidal thoughts 0 0   PHQ-9 Score 12 7  Difficult doing work/chores Very difficult Not difficult at all     Data saved with a previous flowsheet row definition    BP Readings from Last 3 Encounters:  03/24/24 (!) 150/84  03/03/24 139/74  06/06/19 (!) 148/97    Physical Exam  Wt Readings from Last 3 Encounters:  03/24/24 229 lb (103.9 kg)  03/01/24 220 lb 14.4 oz (100.2 kg)  06/06/19 235 lb (106.6 kg)    There were no vitals taken for this visit.  Assessment and Plan:  Problem List Items Addressed This Visit   None   No follow-ups on file.    Leita HILARIO Adie, MD [facility] [place]

## 2024-04-05 NOTE — Telephone Encounter (Signed)
 Pt stated he is taking 50 units 3 times a day with meals. Pt stated his blood sugars have been running in the 140s. Please send in a refill.  KP

## 2024-04-14 ENCOUNTER — Encounter: Payer: Self-pay | Admitting: Physician Assistant

## 2024-04-14 ENCOUNTER — Ambulatory Visit: Admitting: Physician Assistant

## 2024-04-14 VITALS — BP 144/82 | HR 95 | Temp 98.2°F | Ht 71.5 in | Wt 233.2 lb

## 2024-04-14 DIAGNOSIS — I10 Essential (primary) hypertension: Secondary | ICD-10-CM | POA: Diagnosis not present

## 2024-04-14 DIAGNOSIS — E1169 Type 2 diabetes mellitus with other specified complication: Secondary | ICD-10-CM

## 2024-04-14 DIAGNOSIS — Z794 Long term (current) use of insulin: Secondary | ICD-10-CM | POA: Diagnosis not present

## 2024-04-14 DIAGNOSIS — E7800 Pure hypercholesterolemia, unspecified: Secondary | ICD-10-CM | POA: Diagnosis not present

## 2024-04-14 MED ORDER — VALSARTAN 80 MG PO TABS: 80.0000 mg | ORAL_TABLET | Freq: Every day | ORAL | 1 refills | Status: DC

## 2024-04-14 MED ORDER — ROSUVASTATIN CALCIUM 5 MG PO TABS: 5.0000 mg | ORAL_TABLET | Freq: Every day | ORAL | 1 refills | Status: DC

## 2024-04-14 NOTE — Assessment & Plan Note (Signed)
 Begin rosuvastatin  to reduce ASCVD risk.

## 2024-04-14 NOTE — Progress Notes (Signed)
 Date:  04/14/2024   Name:  Franklin Douglas   DOB:  02-10-1971   MRN:  [id number]   Chief Complaint: Diabetes (Patient following up on his diabetes. He is not using the [place] 3 device as his phone is not compatible with the device/ system. He has been using the strips to monitor his blood sugar. His blood sugar was 217 this morning. He took 30 units of insulin  to get blood sugar down. )  Diabetes   [name] returns for 3-week follow-up on chronic conditions, namely DM, after establishing care with our clinic 03/24/2024 (previously lost to primary care for 5 years). History of inadequately controlled insulin -dependent DM2, diabetic neuropathy, hypertension, alcohol use, CAD status post PCI and stenting in 2017, hyperlipidemia.    He continues with mealtime insulin  lispro and checks fingerstick glucose several times per day which varies between 130 and 300 mg/dL. Insulin  burden is high. At this time he is not interested in basal insulin  or endo referral. He is quite intentional about food quality, quantity, and timing.  Last visit we prescribed Freestyle Libre CGM and sensors. There was some confusion with insurance; at first it was denied but then approved. Patient has the [place] 3 Plus sensors but not the reader. He says he could use Libre 2 sensors with an old smart phone he has (WiFi only).   He has used oral medications in the past including metformin and others, but these were other not well-tolerated or not effective.  He does not believe he has ever been tested for type 1.5/LADA. Diagnosed with DM age 35. His mother was also diagnosed later in adulthood.   He was admitted at [facility] 03/01/2024 for DKA. A1c during his admission was 8.8%  Last visit he was found to be hypertensive, possibly whitecoat component, though he endorses previously being on lisinopril and was switched to losartan  due to cough.  Other relevant medications that were stopped in recent years include clopidogrel , atorvastatin  80  mg, aspirin .  Medication list has been reviewed and updated.  Current Meds  Medication Sig   insulin  lispro (HUMALOG ) 100 UNIT/ML injection Inject 0.5 mLs (50 Units total) into the skin 3 (three) times daily with meals.   rosuvastatin  (CRESTOR ) 5 MG tablet Take 1 tablet (5 mg total) by mouth daily.   valsartan  (DIOVAN ) 80 MG tablet Take 1 tablet (80 mg total) by mouth daily.     Review of Systems  Patient Active Problem List   Diagnosis Date Noted   History of diabetic ketoacidosis 03/01/2024   Atherosclerosis of native coronary artery of native heart with angina pectoris (HCC) 08/13/2016   Chronic systolic heart failure (HCC) 03/23/2016   Diabetic polyneuropathy associated with type 2 diabetes mellitus (HCC) 02/26/2016   AA (alcohol abuse) 12/02/2015   Peripheral neuropathic pain 12/02/2015   Erectile dysfunction 12/02/2015   Type 2 diabetes mellitus with other specified complication (HCC) 05/12/2013   Hypertension 05/12/2013   Pure hypercholesterolemia 05/12/2013   Diverticulosis 05/12/2013    Allergies  Allergen Reactions   Sulfa Antibiotics Hives, Shortness Of Breath, Rash and Anaphylaxis   Ace Inhibitors     Cough     There is no immunization history on file for this patient.  Past Surgical History:  Procedure Laterality Date   CARDIAC SURGERY     COLON SURGERY     HERNIA REPAIR      Social History   Tobacco Use   Smoking status: Never   Smokeless tobacco: Never  Vaping  Use   Vaping status: Never Used  Substance Use Topics   Alcohol use: Yes    Alcohol/week: 20.0 standard drinks of alcohol    Types: 20 Shots of liquor per week    Comment: 8 drinks/ day   Drug use: No    Family History  Problem Relation Age of Onset   Diabetes Mother    Diabetes Brother         03/24/2024    1:50 PM  GAD 7 : Generalized Anxiety Score  Nervous, Anxious, on Edge 1  Control/stop worrying 2  Worry too much - different things 2  Trouble relaxing 1  Restless 1   Easily annoyed or irritable 2  Afraid - awful might happen 1  Total GAD 7 Score 10  Anxiety Difficulty Somewhat difficult       04/14/2024    8:25 AM 03/24/2024    1:50 PM 06/22/2016   12:03 PM  Depression screen PHQ 2/9  Decreased Interest 0 2 0  Down, Depressed, Hopeless 0 1 1  PHQ - 2 Score 0 3 1  Altered sleeping  1 1  Tired, decreased energy  2 1  Change in appetite  2 1  Feeling bad or failure about yourself   1 1  Trouble concentrating  1 1  Moving slowly or fidgety/restless  2 1  Suicidal thoughts  0 0   PHQ-9 Score  12 7  Difficult doing work/chores  Very difficult Not difficult at all     Data saved with a previous flowsheet row definition    BP Readings from Last 3 Encounters:  04/14/24 (!) 144/82  03/24/24 (!) 150/84  03/03/24 139/74    Wt Readings from Last 3 Encounters:  04/14/24 233 lb 3.2 oz (105.8 kg)  03/24/24 229 lb (103.9 kg)  03/01/24 220 lb 14.4 oz (100.2 kg)    BP (!) 144/82 (Cuff Size: Normal)   Pulse 95   Temp 98.2 F (36.8 C)   Ht 5' 11.5 (1.816 m)   Wt 233 lb 3.2 oz (105.8 kg)   SpO2 99%   BMI 32.07 kg/m   Physical Exam Vitals and nursing note reviewed.  Constitutional:      Appearance: Normal appearance.  Neck:     Vascular: No carotid bruit.  Cardiovascular:     Rate and Rhythm: Normal rate and regular rhythm.     Heart sounds: No murmur heard.    No friction rub. No gallop.  Pulmonary:     Effort: Pulmonary effort is normal.     Breath sounds: Normal breath sounds.  Abdominal:     General: There is no distension.  Musculoskeletal:        General: Normal range of motion.  Skin:    General: Skin is warm and dry.  Neurological:     Mental Status: He is alert and oriented to person, place, and time.     Gait: Gait is intact.  Psychiatric:        Mood and Affect: Mood and affect normal.     Recent Labs     Component Value Date/Time   NA 132 (L) 03/03/2024 0432   NA 136 (A) 11/15/2014 0000   NA 122 (L)  08/30/2013 0637   K 3.6 03/03/2024 0432   K 4.5 08/30/2013 0637   CL 105 03/03/2024 0432   CL 89 (L) 08/30/2013 0637   CO2 17 (L) 03/03/2024 0432   CO2 17 (L) 08/30/2013 0637   GLUCOSE 298 (  H) 03/03/2024 0432   GLUCOSE 402 (H) 08/30/2013 0637   BUN 16 03/03/2024 0432   BUN 14 11/15/2014 0000   BUN 13 08/30/2013 0637   CREATININE 0.86 03/03/2024 0432   CREATININE 0.53 (L) 08/30/2013 0637   CALCIUM  8.1 (L) 03/03/2024 0432   CALCIUM  9.1 08/30/2013 0637   PROT 5.6 (L) 03/03/2024 0432   PROT 7.7 08/30/2013 0730   ALBUMIN 3.1 (L) 03/03/2024 0432   ALBUMIN 4.1 08/30/2013 0730   AST 12 (L) 03/03/2024 0432   AST 29 08/30/2013 0730   ALT 16 03/03/2024 0432   ALT 30 08/30/2013 0730   ALKPHOS 58 03/03/2024 0432   ALKPHOS 94 08/30/2013 0730   BILITOT 1.4 (H) 03/03/2024 0432   BILITOT 0.8 08/30/2013 0730   GFRNONAA >60 03/03/2024 0432   GFRNONAA >60 08/30/2013 0637   GFRAA >60 06/06/2019 0938   GFRAA >60 08/30/2013 0637    Lab Results  Component Value Date   WBC 7.2 03/03/2024   HGB 13.3 03/03/2024   HCT 38.0 (L) 03/03/2024   MCV 88.4 03/03/2024   PLT 151 03/03/2024   Lab Results  Component Value Date   HGBA1C 8.8 (H) 03/01/2024   Lab Results  Component Value Date   CHOL 166 01/13/2019   HDL 44 01/13/2019   LDLCALC 54 01/13/2019   TRIG 340 (H) 01/13/2019   CHOLHDL 3.8 01/13/2019   Lab Results  Component Value Date   TSH 2.68 02/20/2015     Assessment and Plan:  Type 2 diabetes mellitus with other specified complication, with long-term current use of insulin  (HCC) Assessment & Plan: Continue insulin  as prescribed, I think acquiring CGM would be extremely helpful.  For now, patient does not want to start basal insulin  or see endocrinology.  Happy to prescribe libre 2 sensors if he is unable to obtain the [place] 3 reader.   Primary hypertension Assessment & Plan: Restart ARB for BP control and renal protection in the context of diabetes.  Prescribing valsartan , as it  appears this is preferred over losartan  with Medicaid.  Advised to start this medication first, use it for 1 to 2 weeks, then start rosuvastatin  so as to avoid side effect confusion.  Encouraged home BP monitoring.  Orders: -     Valsartan ; Take 1 tablet (80 mg total) by mouth daily.  Dispense: 30 tablet; Refill: 1  Pure hypercholesterolemia Assessment & Plan: Begin rosuvastatin  to reduce ASCVD risk.   Orders: -     Rosuvastatin  Calcium ; Take 1 tablet (5 mg total) by mouth daily.  Dispense: 30 tablet; Refill: 1     Return in about 5 weeks (around 05/22/2024) for OV f/u chronic conditions.  Plan for repeat of routine labs including GHP, A1c, lipids, and LADA panel  Rolan Hoyle, PA-C, DMSc, Nutritionist [facility] [phone]

## 2024-04-14 NOTE — Assessment & Plan Note (Signed)
 Continue insulin  as prescribed, I think acquiring CGM would be extremely helpful.  For now, patient does not want to start basal insulin  or see endocrinology.  Happy to prescribe libre 2 sensors if he is unable to obtain the [place] 3 reader.

## 2024-04-14 NOTE — Assessment & Plan Note (Signed)
 Restart ARB for BP control and renal protection in the context of diabetes.  Prescribing valsartan , as it appears this is preferred over losartan  with Medicaid.  Advised to start this medication first, use it for 1 to 2 weeks, then start rosuvastatin  so as to avoid side effect confusion.  Encouraged home BP monitoring.

## 2024-04-17 ENCOUNTER — Telehealth: Payer: Self-pay

## 2024-04-17 NOTE — Telephone Encounter (Signed)
 Copied from CRM [id number]. Topic: Clinical - Prescription Issue >> Apr 17, 2024 11:26 AM Precious C wrote: Reason for CRM: Patient called regarding his [place] 3 reader. He stated that [facility] denied approval for the reader and is requesting guidance on what to do moving forward. Patient prefers to be contacted through MyChart.

## 2024-04-18 ENCOUNTER — Other Ambulatory Visit (HOSPITAL_COMMUNITY): Payer: Self-pay

## 2024-04-18 NOTE — Telephone Encounter (Signed)
 Yes, it is covered and his copay is $0.00.  I will contact [facility] because insurance is very picky and if they are not running the reader through for 365 days then it will reject.

## 2024-04-18 NOTE — Telephone Encounter (Signed)
 I contacted his pharmacy and they ran it through this morning and got a paid claim with $0.00 as well and they were getting that ready for him.

## 2024-04-18 NOTE — Telephone Encounter (Signed)
 Spoke with patient via phone, informed of $0 copay for reader. Should be able to pick up from pharmacy today.

## 2024-05-09 ENCOUNTER — Other Ambulatory Visit: Payer: Self-pay | Admitting: Physician Assistant

## 2024-05-09 ENCOUNTER — Other Ambulatory Visit: Payer: Self-pay

## 2024-05-09 DIAGNOSIS — E1169 Type 2 diabetes mellitus with other specified complication: Secondary | ICD-10-CM

## 2024-05-09 MED ORDER — INSULIN LISPRO 100 UNIT/ML IJ SOLN
50.0000 [IU] | Freq: Three times a day (TID) | INTRAMUSCULAR | 0 refills | Status: DC
Start: 2024-05-09 — End: 2024-06-15

## 2024-05-09 NOTE — Telephone Encounter (Signed)
 Copied from CRM [id number]. Topic: Clinical - Medication Refill >> May 09, 2024  1:26 PM Turkey B wrote: Medication: insulin  lispro (HUMALOG ) 100 UNIT/ML injection  Has the patient contacted their pharmacy? No Patient thought he had to call in, since it says no refills    This is the patient's preferred pharmacy:  [facility] [address], [place] - [address] [place] [address] Phone: [phone] Fax: [phone]  Is this the correct pharmacy for this prescription? yes If no, delete pharmacy and type the correct one.   Has the prescription been filled recently? no  Is the patient out of the medication? Has 2 days left  Has the patient been seen for an appointment in the last year OR does the patient have an upcoming appointment? yes  Can we respond through MyChart? yes  Agent: Please be advised that Rx refills may take up to 3 business days. We ask that you follow-up with your pharmacy.

## 2024-05-09 NOTE — Telephone Encounter (Signed)
 Please review.  KP

## 2024-05-22 ENCOUNTER — Ambulatory Visit: Admitting: Physician Assistant

## 2024-05-22 ENCOUNTER — Encounter: Payer: Self-pay | Admitting: Physician Assistant

## 2024-05-22 VITALS — BP 120/78 | HR 88 | Temp 98.3°F | Ht 71.5 in | Wt 230.0 lb

## 2024-05-22 DIAGNOSIS — I10 Essential (primary) hypertension: Secondary | ICD-10-CM | POA: Diagnosis not present

## 2024-05-22 DIAGNOSIS — R739 Hyperglycemia, unspecified: Secondary | ICD-10-CM

## 2024-05-22 DIAGNOSIS — E7800 Pure hypercholesterolemia, unspecified: Secondary | ICD-10-CM | POA: Diagnosis not present

## 2024-05-22 DIAGNOSIS — E1169 Type 2 diabetes mellitus with other specified complication: Secondary | ICD-10-CM

## 2024-05-22 DIAGNOSIS — E1142 Type 2 diabetes mellitus with diabetic polyneuropathy: Secondary | ICD-10-CM | POA: Diagnosis not present

## 2024-05-22 DIAGNOSIS — F1090 Alcohol use, unspecified, uncomplicated: Secondary | ICD-10-CM

## 2024-05-22 DIAGNOSIS — Z794 Long term (current) use of insulin: Secondary | ICD-10-CM

## 2024-05-22 MED ORDER — ROSUVASTATIN CALCIUM 5 MG PO TABS: 5.0000 mg | ORAL_TABLET | Freq: Every day | ORAL | 1 refills | Status: DC

## 2024-05-22 MED ORDER — VALSARTAN 80 MG PO TABS: 80.0000 mg | ORAL_TABLET | Freq: Every day | ORAL | 1 refills | Status: AC

## 2024-05-22 NOTE — Assessment & Plan Note (Signed)
 Discussed the impact of alcohol consumption on management of diabetes.  Would be wise to limit or eliminate alcohol moving forward.  Briefly discussed pharmacotherapy, but patient would like to defer at this time.  Could consider naltrexone at a future visit.

## 2024-05-22 NOTE — Assessment & Plan Note (Signed)
 Likely neuropathy is due to diabetes, but will also check B12 and B9 in the context of alcohol use disorder

## 2024-05-22 NOTE — Assessment & Plan Note (Signed)
 Seems to be doing well with valsartan  monotherapy, even at low-dose.  Continue this for now as it will offer renal protection in the context of diabetes

## 2024-05-22 NOTE — Progress Notes (Signed)
 Date:  05/22/2024   Name:  Franklin Douglas   DOB:  10-22-1970   MRN:  [id number]   Chief Complaint: Diabetes (BS running good at home, gets a spike at night between 11pm and 3 am, without eating anything, pain in feet, unchanged not getting better, feet feels numb and painful )  HPI Jeramiah returns for 5-week follow-up on chronic conditions, namely DM, after establishing care with our clinic 03/24/2024 (previously lost to primary care for 5 years). History of inadequately controlled insulin -dependent DM2, diabetic neuropathy, hypertension, alcohol use, CAD status post PCI and stenting in 2017, hyperlipidemia.    He continues with mealtime insulin  lispro, thankfully was able to obtain CGM and reader.  Insulin  burden is high. At this time he is not interested in basal insulin  or endo referral. He is quite intentional about food quality, quantity, and timing.  Reports that his average glucose per his CGM is 140-150, though he is having unprovoked nocturnal hypoglycemia usually between the hours of 11p-3a.  Currently out of work due to progressively worsening diabetic neuropathy of both feet which limit his ability to stand due to significant pain after prolonged periods.  He has used oral medications in the past including metformin and others, but these were other not well-tolerated or not effective.  He does not believe he has ever been tested for type 1.5/LADA. Diagnosed with DM age 36. His mother was also diagnosed later in adulthood.   He was admitted at [facility] 03/01/2024 for DKA. A1c during his admission was 8.8%  Last visit we restarted valsartan  80 mg and rosuvastatin  5 mg.  He struggles with chronic alcohol use disorder, says he purchases about 750 mL of liquor roughly every 3 days.  He says that he drinks most days, but not all of them, and does not experience any withdrawal symptoms on the days when he is sober, even if he goes 72 hours without drinking any alcohol.  Medication list has  been reviewed and updated.  Current Meds  Medication Sig   Continuous Glucose Receiver (FREESTYLE LIBRE 3 READER) DEVI 1 each by Does not apply route as directed.   Continuous Glucose Sensor (FREESTYLE LIBRE 3 PLUS SENSOR) MISC Change sensor every 15 days.   insulin  lispro (HUMALOG ) 100 UNIT/ML injection Inject 0.5 mLs (50 Units total) into the skin 3 (three) times daily with meals.   [DISCONTINUED] rosuvastatin  (CRESTOR ) 5 MG tablet Take 1 tablet (5 mg total) by mouth daily.   [DISCONTINUED] valsartan  (DIOVAN ) 80 MG tablet Take 1 tablet (80 mg total) by mouth daily.     Review of Systems  Patient Active Problem List   Diagnosis Date Noted   History of diabetic ketoacidosis 03/01/2024   Atherosclerosis of native coronary artery of native heart with angina pectoris (HCC) 08/13/2016   Angina pectoris (HCC) 05/14/2016   Chronic systolic heart failure (HCC) 03/23/2016   Tachycardia with heart rate 100-120 beats per minute 03/23/2016   Diabetic polyneuropathy associated with type 2 diabetes mellitus (HCC) 02/26/2016   Alcohol use disorder 12/02/2015   Peripheral neuropathic pain 12/02/2015   Erectile dysfunction 12/02/2015   DM (diabetes mellitus) (HCC) 05/12/2013   Hypertension 05/12/2013   Pure hypercholesterolemia 05/12/2013   Diverticulosis 05/12/2013    Allergies  Allergen Reactions   Sulfa Antibiotics Hives, Shortness Of Breath, Rash and Anaphylaxis   Ace Inhibitors     Cough    Immunization History  Administered Date(s) Administered   Influenza Inj Mdck Quad Pf 08/02/2018  Influenza,inj,Quad PF,6+ Mos 10/29/2015   Influenza-Unspecified 07/14/2016, 08/02/2018   Moderna Sars-Covid-2 Vaccination 01/31/2020, 02/28/2020   Pneumococcal Polysaccharide-23 10/29/2015   Tdap 05/27/2018    Past Surgical History:  Procedure Laterality Date   CARDIAC SURGERY     COLON SURGERY     HERNIA REPAIR      Social History   Tobacco Use   Smoking status: Never   Smokeless  tobacco: Never  Vaping Use   Vaping status: Never Used  Substance Use Topics   Alcohol use: Yes    Alcohol/week: 20.0 standard drinks of alcohol    Types: 20 Shots of liquor per week    Comment: 8 drinks/ day   Drug use: No    Family History  Problem Relation Age of Onset   Diabetes Mother    Diabetes Brother         05/22/2024    2:40 PM 03/24/2024    1:50 PM  GAD 7 : Generalized Anxiety Score  Nervous, Anxious, on Edge 0 1  Control/stop worrying 0 2  Worry too much - different things 0 2  Trouble relaxing 0 1  Restless 0 1  Easily annoyed or irritable 1 2  Afraid - awful might happen 0 1  Total GAD 7 Score 1 10  Anxiety Difficulty Not difficult at all Somewhat difficult       05/22/2024    2:40 PM 04/14/2024    8:25 AM 03/24/2024    1:50 PM  Depression screen PHQ 2/9  Decreased Interest 0 0 2  Down, Depressed, Hopeless 0 0 1  PHQ - 2 Score 0 0 3  Altered sleeping   1  Tired, decreased energy   2  Change in appetite   2  Feeling bad or failure about yourself    1  Trouble concentrating   1  Moving slowly or fidgety/restless   2  Suicidal thoughts   0  PHQ-9 Score   12  Difficult doing work/chores   Very difficult    BP Readings from Last 3 Encounters:  05/22/24 120/78  04/14/24 (!) 144/82  03/24/24 (!) 150/84    Wt Readings from Last 3 Encounters:  05/22/24 230 lb (104.3 kg)  04/14/24 233 lb 3.2 oz (105.8 kg)  03/24/24 229 lb (103.9 kg)    BP 120/78   Pulse 88   Temp 98.3 F (36.8 C)   Ht 5' 11.5 (1.816 m)   Wt 230 lb (104.3 kg)   SpO2 97%   BMI 31.63 kg/m   Physical Exam Vitals and nursing note reviewed.  Constitutional:      Appearance: Normal appearance.  Cardiovascular:     Rate and Rhythm: Normal rate.  Pulmonary:     Effort: Pulmonary effort is normal.  Abdominal:     General: There is no distension.  Musculoskeletal:        General: Normal range of motion.  Skin:    General: Skin is warm and dry.  Neurological:     Mental  Status: He is alert and oriented to person, place, and time.     Gait: Gait is intact.  Psychiatric:        Mood and Affect: Mood and affect normal.    Diabetic Foot Exam - Simple   Simple Foot Form Diabetic Foot exam was performed with the following findings: Yes 05/22/2024  2:45 PM  Visual Inspection No deformities, no ulcerations, no other skin breakdown bilaterally: Yes Sensation Testing See comments: Yes Pulse Check  Posterior Tibialis and Dorsalis pulse intact bilaterally: Yes Comments Decreased fine sensation apparent bilaterally, worse on left      Recent Labs     Component Value Date/Time   NA 132 (L) 03/03/2024 0432   NA 136 (A) 11/15/2014 0000   NA 122 (L) 08/30/2013 0637   K 3.6 03/03/2024 0432   K 4.5 08/30/2013 0637   CL 105 03/03/2024 0432   CL 89 (L) 08/30/2013 0637   CO2 17 (L) 03/03/2024 0432   CO2 17 (L) 08/30/2013 0637   GLUCOSE 298 (H) 03/03/2024 0432   GLUCOSE 402 (H) 08/30/2013 0637   BUN 16 03/03/2024 0432   BUN 14 11/15/2014 0000   BUN 13 08/30/2013 0637   CREATININE 0.86 03/03/2024 0432   CREATININE 0.53 (L) 08/30/2013 0637   CALCIUM  8.1 (L) 03/03/2024 0432   CALCIUM  9.1 08/30/2013 0637   PROT 5.6 (L) 03/03/2024 0432   PROT 7.7 08/30/2013 0730   ALBUMIN 3.1 (L) 03/03/2024 0432   ALBUMIN 4.1 08/30/2013 0730   AST 12 (L) 03/03/2024 0432   AST 29 08/30/2013 0730   ALT 16 03/03/2024 0432   ALT 30 08/30/2013 0730   ALKPHOS 58 03/03/2024 0432   ALKPHOS 94 08/30/2013 0730   BILITOT 1.4 (H) 03/03/2024 0432   BILITOT 0.8 08/30/2013 0730   GFRNONAA >60 03/03/2024 0432   GFRNONAA >60 08/30/2013 0637   GFRAA >60 06/06/2019 0938   GFRAA >60 08/30/2013 0637    Lab Results  Component Value Date   WBC 7.2 03/03/2024   HGB 13.3 03/03/2024   HCT 38.0 (L) 03/03/2024   MCV 88.4 03/03/2024   PLT 151 03/03/2024   Lab Results  Component Value Date   HGBA1C 8.8 (H) 03/01/2024   HGBA1C 7.6 (H) 01/13/2019   HGBA1C 6.8 02/20/2015   Lab Results   Component Value Date   CHOL 166 01/13/2019   HDL 44 01/13/2019   LDLCALC 54 01/13/2019   TRIG 340 (H) 01/13/2019   CHOLHDL 3.8 01/13/2019   Lab Results  Component Value Date   TSH 2.68 02/20/2015      Assessment and Plan:  Type 2 diabetes mellitus with other specified complication, with long-term current use of insulin  [facility]) Assessment & Plan: Seems to be reasonably well-controlled now that he has his CGM and is recognizing trends.  Expect his A1c today to be less than 8%.  No changes to pharmacotherapy at this time  Orders: -     CBC with Differential/Platelet -     Comprehensive metabolic panel with GFR -     TSH -     Lipid panel -     Microalbumin / creatinine urine ratio -     C-peptide -     Diabetes Autoimmune Profile -     Hemoglobin A1c  Chronic hyperglycemia -     C-peptide -     Diabetes Autoimmune Profile  Diabetic polyneuropathy associated with type 2 diabetes mellitus (HCC) Assessment & Plan: Likely neuropathy is due to diabetes, but will also check B12 and B9 in the context of alcohol use disorder  Orders: -     B12 and Folate Panel  Primary hypertension Assessment & Plan: Seems to be doing well with valsartan  monotherapy, even at low-dose.  Continue this for now as it will offer renal protection in the context of diabetes  Orders: -     CBC with Differential/Platelet -     Comprehensive metabolic panel with GFR -  TSH -     Lipid panel -     Valsartan ; Take 1 tablet (80 mg total) by mouth daily.  Dispense: 90 tablet; Refill: 1  Pure hypercholesterolemia Assessment & Plan: Check nonfasting lipids, continue rosuvastatin  to lower ASCVD risk  Orders: -     Lipid panel -     Rosuvastatin  Calcium ; Take 1 tablet (5 mg total) by mouth daily.  Dispense: 90 tablet; Refill: 1  Alcohol use disorder Assessment & Plan: Discussed the impact of alcohol consumption on management of diabetes.  Would be wise to limit or eliminate alcohol moving forward.   Briefly discussed pharmacotherapy, but patient would like to defer at this time.  Could consider naltrexone at a future visit.  Orders: -     B12 and Folate Panel     Return in about 3 months (around 08/22/2024) for OV f/u chronic conditions.    Rolan Hoyle, PA-C, DMSc, Nutritionist [facility] Care and [facility] [phone]

## 2024-05-22 NOTE — Assessment & Plan Note (Signed)
 Check nonfasting lipids, continue rosuvastatin  to lower ASCVD risk

## 2024-05-22 NOTE — Assessment & Plan Note (Signed)
 Seems to be reasonably well-controlled now that he has his CGM and is recognizing trends.  Expect his A1c today to be less than 8%.  No changes to pharmacotherapy at this time

## 2024-06-04 LAB — CBC WITH DIFFERENTIAL/PLATELET
Basophils Absolute: 0 x10E3/uL (ref 0.0–0.2)
Basos: 1 %
EOS (ABSOLUTE): 0.2 x10E3/uL (ref 0.0–0.4)
Eos: 4 %
Hematocrit: 45.4 % (ref 37.5–51.0)
Hemoglobin: 15 g/dL (ref 13.0–17.7)
Immature Grans (Abs): 0 x10E3/uL (ref 0.0–0.1)
Immature Granulocytes: 0 %
Lymphocytes Absolute: 1.1 x10E3/uL (ref 0.7–3.1)
Lymphs: 26 %
MCH: 31.6 pg (ref 26.6–33.0)
MCHC: 33 g/dL (ref 31.5–35.7)
MCV: 96 fL (ref 79–97)
Monocytes Absolute: 0.3 x10E3/uL (ref 0.1–0.9)
Monocytes: 8 %
Neutrophils Absolute: 2.6 x10E3/uL (ref 1.4–7.0)
Neutrophils: 61 %
Platelets: 180 x10E3/uL (ref 150–450)
RBC: 4.75 x10E6/uL (ref 4.14–5.80)
RDW: 13.5 % (ref 11.6–15.4)
WBC: 4.2 x10E3/uL (ref 3.4–10.8)

## 2024-06-04 LAB — TSH: TSH: 2.15 u[IU]/mL (ref 0.450–4.500)

## 2024-06-04 LAB — LIPID PANEL
Chol/HDL Ratio: 6 ratio — ABNORMAL HIGH (ref 0.0–5.0)
Cholesterol, Total: 215 mg/dL — ABNORMAL HIGH (ref 100–199)
HDL: 36 mg/dL — ABNORMAL LOW (ref >39)
LDL Chol Calc (NIH): 114 mg/dL — ABNORMAL HIGH (ref 0–99)
Triglycerides: 374 mg/dL — ABNORMAL HIGH (ref 0–149)
VLDL Cholesterol Cal: 65 mg/dL — ABNORMAL HIGH (ref 5–40)

## 2024-06-04 LAB — COMPREHENSIVE METABOLIC PANEL WITH GFR
ALT: 17 IU/L (ref 0–44)
AST: 19 IU/L (ref 0–40)
Albumin: 4.5 g/dL (ref 3.8–4.9)
Alkaline Phosphatase: 87 IU/L (ref 44–121)
BUN/Creatinine Ratio: 16 (ref 9–20)
BUN: 19 mg/dL (ref 6–24)
Bilirubin Total: 0.5 mg/dL (ref 0.0–1.2)
CO2: 20 mmol/L (ref 20–29)
Calcium: 9.3 mg/dL (ref 8.7–10.2)
Chloride: 102 mmol/L (ref 96–106)
Creatinine, Ser: 1.17 mg/dL (ref 0.76–1.27)
Globulin, Total: 2.3 g/dL (ref 1.5–4.5)
Glucose: 177 mg/dL — ABNORMAL HIGH (ref 70–99)
Potassium: 4.6 mmol/L (ref 3.5–5.2)
Sodium: 137 mmol/L (ref 134–144)
Total Protein: 6.8 g/dL (ref 6.0–8.5)
eGFR: 74 mL/min/1.73 (ref >59)

## 2024-06-04 LAB — DIABETES AUTOIMMUNE PROFILE
Anti GAD 65 Antibodies: <5 U/mL
IA-2 Autoantibodies: <7.5 U/mL
Insulin AutoAb: <5 uU/mL
ZNT8 Antibodies: <15 U/mL

## 2024-06-04 LAB — C-PEPTIDE: C-Peptide: 2.4 ng/mL (ref 1.1–4.4)

## 2024-06-04 LAB — B12 AND FOLATE PANEL
Folate: 5.6 ng/mL (ref >3.0)
Vitamin B-12: 252 pg/mL (ref 232–1245)

## 2024-06-04 LAB — MICROALBUMIN / CREATININE URINE RATIO
Creatinine, Urine: 95.9 mg/dL
Microalb/Creat Ratio: 773 mg/g{creat} — ABNORMAL HIGH (ref 0–29)
Microalbumin, Urine: 741.5 ug/mL

## 2024-06-04 LAB — HEMOGLOBIN A1C
Est. average glucose Bld gHb Est-mCnc: 154 mg/dL
Hgb A1c MFr Bld: 7 % — ABNORMAL HIGH (ref 4.8–5.6)

## 2024-06-05 ENCOUNTER — Encounter: Payer: Self-pay | Admitting: Physician Assistant

## 2024-06-05 ENCOUNTER — Ambulatory Visit: Payer: Self-pay | Admitting: Physician Assistant

## 2024-06-05 DIAGNOSIS — E1129 Type 2 diabetes mellitus with other diabetic kidney complication: Secondary | ICD-10-CM | POA: Insufficient documentation

## 2024-06-14 ENCOUNTER — Other Ambulatory Visit: Payer: Self-pay | Admitting: Physician Assistant

## 2024-06-14 DIAGNOSIS — E1169 Type 2 diabetes mellitus with other specified complication: Secondary | ICD-10-CM

## 2024-06-15 NOTE — Telephone Encounter (Signed)
 Requested Prescriptions  Pending Prescriptions Disp Refills   insulin  lispro (HUMALOG ) 100 UNIT/ML injection [Pharmacy Med Name: Insulin  Lispro 100 UNIT/ML Subcutaneous Solution] 50 mL 2    Sig: INJECT 50 UNITS  INTO THE SKIN THREE TIMES DAILY WITH MEALS     Endocrinology:  Diabetes - Insulins Passed - 06/15/2024  2:14 PM      Passed - HBA1C is between 0 and 7.9 and within 180 days    Hemoglobin A1C  Date Value Ref Range Status  02/20/2015 6.8  Final   Hgb A1c MFr Bld  Date Value Ref Range Status  05/22/2024 7.0 (H) 4.8 - 5.6 % Final    Comment:             Prediabetes: 5.7 - 6.4          Diabetes: >6.4          Glycemic control for adults with diabetes: <7.0          Passed - Valid encounter within last 6 months    Recent Outpatient Visits           3 weeks ago Type 2 diabetes mellitus with other specified complication, with long-term current use of insulin  [facility])   [place] [facility] at [facility], [place]QU[place]B, [place]   2 months ago Type 2 diabetes mellitus with other specified complication, with long-term current use of insulin  [facility])   [place] [facility] at [facility], Toribio [place]QU[place]B, [place]   2 months ago Type 2 diabetes mellitus with other specified complication, with long-term current use of insulin  [facility])   [facility] at [facility], Toribio SQUIBB, [place]

## 2024-07-07 ENCOUNTER — Telehealth: Payer: Self-pay | Admitting: Physician Assistant

## 2024-07-07 NOTE — Telephone Encounter (Signed)
 Called pt told him to call the manufacturer [phone]. Pt verbalized understanding.  KP

## 2024-07-07 NOTE — Telephone Encounter (Signed)
 Copied from CRM [id number]. Topic: Clinical - Prescription Issue >> Jul 07, 2024  9:50 AM Wess RAMAN wrote: Reason for CRM: Patient dropped Continuous Glucose Receiver (FREESTYLE LIBRE 3 READER) DEVI in water and would like to know if he can get another one.  Callback #: [phone]  Pharmacy: [facility] [address] [place] - [address] [place] [address] Phone: [phone] Fax: [phone] Hours: Not open 24 hours

## 2024-08-22 ENCOUNTER — Other Ambulatory Visit: Payer: Self-pay | Admitting: Physician Assistant

## 2024-08-22 ENCOUNTER — Ambulatory Visit: Admitting: Physician Assistant

## 2024-08-22 ENCOUNTER — Encounter: Payer: Self-pay | Admitting: Physician Assistant

## 2024-08-22 VITALS — BP 138/84 | HR 87 | Temp 98.0°F | Ht 71.5 in | Wt 221.0 lb

## 2024-08-22 DIAGNOSIS — I252 Old myocardial infarction: Secondary | ICD-10-CM | POA: Insufficient documentation

## 2024-08-22 DIAGNOSIS — I10 Essential (primary) hypertension: Secondary | ICD-10-CM

## 2024-08-22 DIAGNOSIS — R809 Proteinuria, unspecified: Secondary | ICD-10-CM

## 2024-08-22 DIAGNOSIS — Z794 Long term (current) use of insulin: Secondary | ICD-10-CM

## 2024-08-22 DIAGNOSIS — E1129 Type 2 diabetes mellitus with other diabetic kidney complication: Secondary | ICD-10-CM | POA: Diagnosis not present

## 2024-08-22 DIAGNOSIS — E1169 Type 2 diabetes mellitus with other specified complication: Secondary | ICD-10-CM | POA: Diagnosis not present

## 2024-08-22 LAB — POCT GLYCOSYLATED HEMOGLOBIN (HGB A1C): Hemoglobin A1C: 6.2 % — AB (ref 4.0–5.6)

## 2024-08-22 NOTE — Progress Notes (Signed)
 Date:  08/22/2024   Name:  Franklin Douglas   DOB:  1971/03/18   MRN:  [id number]   Chief Complaint: Medical Management of Chronic Issues and Hyperlipidemia (Not taking medication- hasn't taken for 1 month )  HPI  Jarman returns for 65-month follow-up on chronic conditions, namely DM, after establishing care with our clinic 03/24/2024 (previously lost to primary care for 5 years). History of inadequately controlled insulin -dependent DM2, diabetic neuropathy, hypertension, alcohol use, CAD status post PCI and stenting in 2017, hyperlipidemia.    He continues with mealtime insulin  lispro. Since he started using a CGM, his diabetes has gradually improved. Insulin  burden is high. At this time he is not interested in basal insulin  or endo referral. He is quite intentional about food quality, quantity, and timing.    Currently out of work due to progressively worsening diabetic neuropathy of both feet which limit his ability to stand due to significant pain after prolonged periods.  He has used oral medications in the past including metformin and others, but these were other not well-tolerated or not effective.  Recent testing for type 1.5/LADA was negative.   In July we restarted valsartan  80 mg and rosuvastatin  5 mg. As of about 1 month ago, he self-discontinued the rosuvastatin  due to concerns that it would raise his A1c and thus prove counterproductive, as the diabetes seems to be his main health issue. He was tolerating rosuvastatin  well.   We have also discussed microalbuminuria, which was >700 last time it was checked in Aug. Due for repeat today and again in 3 months. 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 Also consider alcohol.   Medication list has been reviewed and updated.  Current Meds  Medication Sig   Continuous Glucose Receiver (FREESTYLE LIBRE 3 READER) DEVI 1 each by Does not apply route as directed.   Continuous Glucose Sensor (FREESTYLE LIBRE 3 PLUS SENSOR) MISC Change sensor  every 15 days.   insulin  lispro (HUMALOG ) 100 UNIT/ML injection INJECT 50 UNITS  INTO THE SKIN THREE TIMES DAILY WITH MEALS   valsartan  (DIOVAN ) 80 MG tablet Take 1 tablet (80 mg total) by mouth daily.     Review of Systems  Patient Active Problem List   Diagnosis Date Noted   History of MI (myocardial infarction) 08/22/2024   Microalbuminuria due to type 2 diabetes mellitus (HCC) 06/05/2024   History of diabetic ketoacidosis 03/01/2024   Atherosclerosis of native coronary artery of native heart with angina pectoris 08/13/2016   Angina pectoris 05/14/2016   Chronic systolic heart failure (HCC) 03/23/2016   Tachycardia with heart rate 100-120 beats per minute 03/23/2016   Diabetic polyneuropathy associated with type 2 diabetes mellitus (HCC) 02/26/2016   Alcohol use disorder 12/02/2015   Peripheral neuropathic pain 12/02/2015   Erectile dysfunction 12/02/2015   Type 2 diabetes mellitus with other specified complication (HCC) 05/12/2013   Hypertension 05/12/2013   Mixed hyperlipidemia 05/12/2013   Diverticulosis 05/12/2013    Allergies  Allergen Reactions   Sulfa Antibiotics Hives, Shortness Of Breath, Rash and Anaphylaxis   Ace Inhibitors     Cough    Immunization History  Administered Date(s) Administered   Influenza Inj Mdck Quad Pf 08/02/2018   Influenza,inj,Quad PF,6+ Mos 10/29/2015   Influenza-Unspecified 07/14/2016, 08/02/2018   Moderna Sars-Covid-2 Vaccination 01/31/2020, 02/28/2020   Pneumococcal Polysaccharide-23 10/29/2015   Tdap 05/27/2018    Past Surgical History:  Procedure Laterality Date   CARDIAC SURGERY     COLON SURGERY     HERNIA REPAIR  Social History   Tobacco Use   Smoking status: Never   Smokeless tobacco: Never  Vaping Use   Vaping status: Never Used  Substance Use Topics   Alcohol use: Yes    Alcohol/week: 20.0 standard drinks of alcohol    Types: 20 Shots of liquor per week    Comment: 8 drinks/ day   Drug use: No     Family History  Problem Relation Age of Onset   Diabetes Mother    Diabetes Brother         08/22/2024    8:50 AM 05/22/2024    2:40 PM 03/24/2024    1:50 PM  GAD 7 : Generalized Anxiety Score  Nervous, Anxious, on Edge 0 0 1  Control/stop worrying 0 0 2  Worry too much - different things 0 0 2  Trouble relaxing 0 0 1  Restless 0 0 1  Easily annoyed or irritable 0 1 2  Afraid - awful might happen 0 0 1  Total GAD 7 Score 0 1 10  Anxiety Difficulty Not difficult at all Not difficult at all Somewhat difficult       08/22/2024    8:49 AM 05/22/2024    2:40 PM 04/14/2024    8:25 AM  Depression screen PHQ 2/9  Decreased Interest 0 0 0  Down, Depressed, Hopeless 0 0 0  PHQ - 2 Score 0 0 0    BP Readings from Last 3 Encounters:  08/22/24 138/84  05/22/24 120/78  04/14/24 (!) 144/82    Wt Readings from Last 3 Encounters:  08/22/24 221 lb (100.2 kg)  05/22/24 230 lb (104.3 kg)  04/14/24 233 lb 3.2 oz (105.8 kg)    BP 138/84   Pulse 87   Temp 98 F (36.7 C)   Ht 5' 11.5 (1.816 m)   Wt 221 lb (100.2 kg)   SpO2 98%   BMI 30.39 kg/m   Physical Exam Vitals and nursing note reviewed.  Constitutional:      Appearance: Normal appearance.  Cardiovascular:     Rate and Rhythm: Normal rate.  Pulmonary:     Effort: Pulmonary effort is normal.  Abdominal:     General: There is no distension.  Musculoskeletal:        General: Normal range of motion.  Skin:    General: Skin is warm and dry.  Neurological:     Mental Status: He is alert and oriented to person, place, and time.     Gait: Gait is intact.  Psychiatric:        Mood and Affect: Mood and affect normal.     Recent Labs     Component Value Date/Time   NA 137 05/22/2024 1543   NA 122 (L) 08/30/2013 0637   K 4.6 05/22/2024 1543   K 4.5 08/30/2013 0637   CL 102 05/22/2024 1543   CL 89 (L) 08/30/2013 0637   CO2 20 05/22/2024 1543   CO2 17 (L) 08/30/2013 0637   GLUCOSE 177 (H) 05/22/2024 1543    GLUCOSE 298 (H) 03/03/2024 0432   GLUCOSE 402 (H) 08/30/2013 0637   BUN 19 05/22/2024 1543   BUN 13 08/30/2013 0637   CREATININE 1.17 05/22/2024 1543   CREATININE 0.53 (L) 08/30/2013 0637   CALCIUM  9.3 05/22/2024 1543   CALCIUM  9.1 08/30/2013 0637   PROT 6.8 05/22/2024 1543   PROT 7.7 08/30/2013 0730   ALBUMIN 4.5 05/22/2024 1543   ALBUMIN 4.1 08/30/2013 0730   AST 19 05/22/2024  1543   AST 29 08/30/2013 0730   ALT 17 05/22/2024 1543   ALT 30 08/30/2013 0730   ALKPHOS 87 05/22/2024 1543   ALKPHOS 94 08/30/2013 0730   BILITOT 0.5 05/22/2024 1543   BILITOT 0.8 08/30/2013 0730   GFRNONAA >60 03/03/2024 0432   GFRNONAA >60 08/30/2013 0637   GFRAA >60 06/06/2019 0938   GFRAA >60 08/30/2013 0637    Lab Results  Component Value Date   WBC 4.2 05/22/2024   HGB 15.0 05/22/2024   HCT 45.4 05/22/2024   MCV 96 05/22/2024   PLT 180 05/22/2024   Lab Results  Component Value Date   HGBA1C 6.2 (A) 08/22/2024   HGBA1C 7.0 (H) 05/22/2024   HGBA1C 8.8 (H) 03/01/2024   Lab Results  Component Value Date   CHOL 215 (H) 05/22/2024   HDL 36 (L) 05/22/2024   LDLCALC 114 (H) 05/22/2024   TRIG 374 (H) 05/22/2024   CHOLHDL 6.0 (H) 05/22/2024   Lab Results  Component Value Date   TSH 2.150 05/22/2024      Assessment and Plan:  Type 2 diabetes mellitus with other specified complication, with long-term current use of insulin  (HCC) Assessment & Plan: A1c 6.2% today which is the best it has been in years.  It seems the use of CGM has really helped him to control his numbers.  Congratulated on this.  No changes to pharmacotherapy at this time  Orders: -     Microalbumin / creatinine urine ratio -     POCT glycosylated hemoglobin (Hb A1C)  Microalbuminuria due to type 2 diabetes mellitus (HCC) Assessment & Plan: Repeat microalbumin today.  Reminded that the 2 most important factors in protecting the kidney are diabetes and hypertension control.  Orders: -     Microalbumin /  creatinine urine ratio  Primary hypertension Assessment & Plan: Encouraged home BP monitoring at least twice weekly with a log for my review next time.  Consider dose increase to valsartan  if home blood pressure consistently greater than 130/80.  Orders: -     Microalbumin / creatinine urine ratio  History of MI (myocardial infarction) Assessment & Plan: Strongly encouraged restart of rosuvastatin .  We discussed that statins very slightly increase insulin  resistance which typically contributes to a bump in A1c of 0.3% or less.  In his case with diabetes and history of MI requiring stent placement, the benefits of statin therapy very likely outweigh any perceived risks.  He will consider.  I have also asked him to send me any research/resources that he would like for me to review.      Return in about 3 months (around 11/22/2024) for FASTING OV.   I personally spent a total of 40 minutes in the care of the patient today including preparing to see the patient, performing a medically appropriate exam/evaluation, counseling and educating, and documenting clinical information in the EHR.   Rolan Hoyle, PA-C, DMSc, Nutritionist [facility] Primary Care and Sports Medicine [facility] [phone]

## 2024-08-22 NOTE — Assessment & Plan Note (Signed)
 Encouraged home BP monitoring at least twice weekly with a log for my review next time.  Consider dose increase to valsartan  if home blood pressure consistently greater than 130/80.

## 2024-08-22 NOTE — Assessment & Plan Note (Signed)
 A1c 6.2% today which is the best it has been in years.  It seems the use of CGM has really helped him to control his numbers.  Congratulated on this.  No changes to pharmacotherapy at this time

## 2024-08-22 NOTE — Assessment & Plan Note (Signed)
 Strongly encouraged restart of rosuvastatin .  We discussed that statins very slightly increase insulin  resistance which typically contributes to a bump in A1c of 0.3% or less.  In his case with diabetes and history of MI requiring stent placement, the benefits of statin therapy very likely outweigh any perceived risks.  He will consider.  I have also asked him to send me any research/resources that he would like for me to review.

## 2024-08-22 NOTE — Assessment & Plan Note (Signed)
 Repeat microalbumin today.  Reminded that the 2 most important factors in protecting the kidney are diabetes and hypertension control.

## 2024-08-23 ENCOUNTER — Encounter: Payer: Self-pay | Admitting: Physician Assistant

## 2024-08-23 ENCOUNTER — Other Ambulatory Visit: Payer: Self-pay | Admitting: Physician Assistant

## 2024-08-23 ENCOUNTER — Ambulatory Visit: Payer: Self-pay | Admitting: Physician Assistant

## 2024-08-23 DIAGNOSIS — N182 Chronic kidney disease, stage 2 (mild): Secondary | ICD-10-CM | POA: Insufficient documentation

## 2024-08-23 LAB — MICROALBUMIN / CREATININE URINE RATIO
Creatinine, Urine: 120.6 mg/dL
Microalb/Creat Ratio: 789 mg/g{creat} — ABNORMAL HIGH (ref 0–29)
Microalbumin, Urine: 951.9 ug/mL

## 2024-08-24 ENCOUNTER — Other Ambulatory Visit: Payer: Self-pay | Admitting: Physician Assistant

## 2024-08-24 DIAGNOSIS — N182 Chronic kidney disease, stage 2 (mild): Secondary | ICD-10-CM

## 2024-08-24 DIAGNOSIS — E1129 Type 2 diabetes mellitus with other diabetic kidney complication: Secondary | ICD-10-CM

## 2024-08-24 DIAGNOSIS — E1169 Type 2 diabetes mellitus with other specified complication: Secondary | ICD-10-CM

## 2024-08-24 DIAGNOSIS — I5022 Chronic systolic (congestive) heart failure: Secondary | ICD-10-CM

## 2024-08-24 MED ORDER — JARDIANCE 25 MG PO TABS
25.0000 mg | ORAL_TABLET | Freq: Every day | ORAL | Status: AC
Start: 2024-09-07 — End: ?

## 2024-08-24 MED ORDER — JARDIANCE 10 MG PO TABS: 10.0000 mg | ORAL_TABLET | Freq: Every day | ORAL | Status: AC

## 2024-08-24 NOTE — Telephone Encounter (Signed)
 Please review.  KP

## 2024-08-29 ENCOUNTER — Telehealth: Payer: Self-pay | Admitting: Pharmacy Technician

## 2024-08-29 ENCOUNTER — Other Ambulatory Visit (HOSPITAL_BASED_OUTPATIENT_CLINIC_OR_DEPARTMENT_OTHER): Payer: Self-pay

## 2024-08-29 NOTE — Telephone Encounter (Signed)
 Pharmacy Patient Advocate Encounter   Received notification from Onbase that prior authorization for FreeStyle Libre 3 Plus Sensor is due for renewal.   Insurance verification completed.   The patient is insured through [employer].  Action: PA required; PA started via CoverMyMeds. KEY [id number] . Waiting for clinical questions to populate.

## 2024-08-29 NOTE — Telephone Encounter (Signed)
 Pharmacy Patient Advocate Encounter  Received notification from [employer] that Prior Authorization for FreeStyle Libre 3 Plus Sensor has been APPROVED from 08/29/24 to 08/29/25   PA #/Case ID/Reference #: [id number]

## 2024-08-30 NOTE — Telephone Encounter (Signed)
Sent pt Mychart message.  KP 

## 2024-09-13 ENCOUNTER — Other Ambulatory Visit (HOSPITAL_COMMUNITY): Payer: Self-pay

## 2024-09-21 ENCOUNTER — Other Ambulatory Visit (HOSPITAL_COMMUNITY): Payer: Self-pay

## 2024-09-21 DIAGNOSIS — E1169 Type 2 diabetes mellitus with other specified complication: Secondary | ICD-10-CM

## 2024-09-21 DIAGNOSIS — E1129 Type 2 diabetes mellitus with other diabetic kidney complication: Secondary | ICD-10-CM

## 2024-09-21 DIAGNOSIS — N182 Chronic kidney disease, stage 2 (mild): Secondary | ICD-10-CM

## 2024-09-21 DIAGNOSIS — I5022 Chronic systolic (congestive) heart failure: Secondary | ICD-10-CM

## 2024-09-21 MED ORDER — JARDIANCE 25 MG PO TABS: 25.0000 mg | ORAL_TABLET | Freq: Every day | ORAL | 1 refills | Status: AC

## 2024-09-21 NOTE — Telephone Encounter (Signed)
 Please review.  KP

## 2024-09-22 ENCOUNTER — Other Ambulatory Visit (HOSPITAL_COMMUNITY): Payer: Self-pay

## 2024-09-22 ENCOUNTER — Telehealth: Payer: Self-pay | Admitting: Pharmacy Technician

## 2024-09-22 ENCOUNTER — Telehealth: Payer: Self-pay

## 2024-09-22 NOTE — Telephone Encounter (Signed)
 Noted  KP

## 2024-09-22 NOTE — Telephone Encounter (Signed)
 Pharmacy Patient Advocate Encounter   Received notification from Pt Calls Messages that prior authorization for Jardiance  25MG  tablets is required/requested.   Insurance verification completed.   The patient is insured through [employer].   Per test claim: The current 30 day co-pay is, $4.00.  No PA needed at this time. This test claim was processed through [facility]- copay amounts may vary at other pharmacies due to pharmacy/plan contracts, or as the patient moves through the different stages of their insurance plan.

## 2024-09-22 NOTE — Telephone Encounter (Signed)
 PA request has been Received. New Encounter has been or will be created for follow up. For additional info see Pharmacy Prior Auth telephone encounter from 09/22/24.

## 2024-09-22 NOTE — Telephone Encounter (Signed)
 Please complete PA for Jardiance  25 MG.  KEY: [id number]  [id number]

## 2024-09-22 NOTE — Telephone Encounter (Signed)
"   The current 30 day co-pay is, $4.00.  No PA needed at this time. This test claim was processed through [facility]- copay amounts may vary at other pharmacies due to pharmacy/plan contracts, or as the patient moves through the different stages of their insurance plan.    "

## 2024-10-09 ENCOUNTER — Other Ambulatory Visit: Payer: Self-pay | Admitting: Physician Assistant

## 2024-10-09 DIAGNOSIS — Z794 Long term (current) use of insulin: Secondary | ICD-10-CM

## 2024-10-11 NOTE — Telephone Encounter (Signed)
 Requested Prescriptions  Pending Prescriptions Disp Refills   insulin  lispro (HUMALOG ) 100 UNIT/ML injection [Pharmacy Med Name: Insulin  Lispro 100 UNIT/ML Injection Solution] 50 mL 0    Sig: INJECT 50 UNITS THREE TIMES DAILY WITH MEALS     Endocrinology:  Diabetes - Insulins Passed - 10/11/2024 10:48 AM      Passed - HBA1C is between 0 and 7.9 and within 180 days    Hemoglobin A1C  Date Value Ref Range Status  08/22/2024 6.2 (A) 4.0 - 5.6 % Final  02/20/2015 6.8  Final   Hgb A1c MFr Bld  Date Value Ref Range Status  05/22/2024 7.0 (H) 4.8 - 5.6 % Final    Comment:             Prediabetes: 5.7 - 6.4          Diabetes: >6.4          Glycemic control for adults with diabetes: <7.0          Passed - Valid encounter within last 6 months    Recent Outpatient Visits           1 month ago Type 2 diabetes mellitus with other specified complication, with long-term current use of insulin  [facility])   [place] [facility], Tor[place]io SQUIBB, [place]   4 months ago Type 2 diabetes mellitus with other specified complication, with long-term current use of insulin  [facility])   [place] [facility] at [facility], [place]io SQU[place]B, [place]   6 months ago Type 2 diabetes mellitus with other specified complication, with long-term current use of insulin  [facility])   [place] [facility] at [facility], Toribio SQUIBB, [place]   6 months ago Type 2 diabetes mellitus with other specified complication, with long-term current use of insulin  [facility])   [facility] at [facility], Toribio SQUIBB, [place]

## 2024-11-22 ENCOUNTER — Ambulatory Visit: Admitting: Physician Assistant
